# Patient Record
Sex: Female | Born: 1935 | Race: White | Hispanic: No | Marital: Married | Smoking: Former smoker
Health system: Southern US, Community
[De-identification: ages and names within clinical notes are randomized; demographics above are authoritative.]

## PROBLEM LIST (undated history)

## (undated) DIAGNOSIS — C801 Malignant (primary) neoplasm, unspecified: Secondary | ICD-10-CM

## (undated) DIAGNOSIS — J449 Chronic obstructive pulmonary disease, unspecified: Secondary | ICD-10-CM

## (undated) DIAGNOSIS — M1990 Unspecified osteoarthritis, unspecified site: Secondary | ICD-10-CM

## (undated) DIAGNOSIS — N179 Acute kidney failure, unspecified: Secondary | ICD-10-CM

## (undated) DIAGNOSIS — C50919 Malignant neoplasm of unspecified site of unspecified female breast: Secondary | ICD-10-CM

## (undated) DIAGNOSIS — R0602 Shortness of breath: Secondary | ICD-10-CM

## (undated) DIAGNOSIS — I10 Essential (primary) hypertension: Secondary | ICD-10-CM

## (undated) DIAGNOSIS — E119 Type 2 diabetes mellitus without complications: Secondary | ICD-10-CM

## (undated) HISTORY — PX: LAPAROSCOPIC CHOLECYSTECTOMY: SUR755

## (undated) HISTORY — PX: BREAST SURGERY: SHX581

## (undated) HISTORY — PX: LAPAROSCOPIC HYSTERECTOMY: SHX1926

## (undated) HISTORY — PX: APPENDECTOMY: SHX54

---

## 1997-12-19 ENCOUNTER — Encounter: Admission: RE | Admit: 1997-12-19 | Discharge: 1998-03-19 | Payer: Self-pay | Admitting: Radiation Oncology

## 1999-07-10 ENCOUNTER — Encounter: Payer: Self-pay | Admitting: Oncology

## 1999-07-10 ENCOUNTER — Encounter: Admission: RE | Admit: 1999-07-10 | Discharge: 1999-07-10 | Payer: Self-pay | Admitting: Oncology

## 1999-09-16 ENCOUNTER — Ambulatory Visit (HOSPITAL_COMMUNITY): Admission: RE | Admit: 1999-09-16 | Discharge: 1999-09-16 | Payer: Self-pay | Admitting: *Deleted

## 1999-09-16 ENCOUNTER — Encounter: Payer: Self-pay | Admitting: *Deleted

## 2000-03-03 ENCOUNTER — Encounter: Payer: Self-pay | Admitting: Oncology

## 2000-03-03 ENCOUNTER — Encounter: Admission: RE | Admit: 2000-03-03 | Discharge: 2000-03-03 | Payer: Self-pay | Admitting: Oncology

## 2000-06-20 ENCOUNTER — Inpatient Hospital Stay (HOSPITAL_COMMUNITY): Admission: EM | Admit: 2000-06-20 | Discharge: 2000-06-22 | Payer: Self-pay | Admitting: *Deleted

## 2000-06-20 ENCOUNTER — Encounter: Payer: Self-pay | Admitting: Family Medicine

## 2000-06-20 ENCOUNTER — Encounter: Admission: RE | Admit: 2000-06-20 | Discharge: 2000-06-20 | Payer: Self-pay | Admitting: Family Medicine

## 2000-06-20 ENCOUNTER — Encounter (INDEPENDENT_AMBULATORY_CARE_PROVIDER_SITE_OTHER): Payer: Self-pay | Admitting: Specialist

## 2000-12-30 ENCOUNTER — Inpatient Hospital Stay (HOSPITAL_COMMUNITY): Admission: RE | Admit: 2000-12-30 | Discharge: 2001-01-01 | Payer: Self-pay | Admitting: Urology

## 2001-03-30 ENCOUNTER — Encounter: Payer: Self-pay | Admitting: Oncology

## 2001-03-30 ENCOUNTER — Encounter: Admission: RE | Admit: 2001-03-30 | Discharge: 2001-03-30 | Payer: Self-pay | Admitting: Oncology

## 2001-07-08 ENCOUNTER — Encounter: Admission: RE | Admit: 2001-07-08 | Discharge: 2001-07-08 | Payer: Self-pay | Admitting: Family Medicine

## 2001-07-08 ENCOUNTER — Encounter: Payer: Self-pay | Admitting: Family Medicine

## 2001-12-04 ENCOUNTER — Encounter: Payer: Self-pay | Admitting: Ophthalmology

## 2001-12-08 ENCOUNTER — Ambulatory Visit (HOSPITAL_COMMUNITY): Admission: RE | Admit: 2001-12-08 | Discharge: 2001-12-08 | Payer: Self-pay | Admitting: Ophthalmology

## 2002-04-01 ENCOUNTER — Encounter: Payer: Self-pay | Admitting: Oncology

## 2002-04-01 ENCOUNTER — Encounter: Admission: RE | Admit: 2002-04-01 | Discharge: 2002-04-01 | Payer: Self-pay | Admitting: Oncology

## 2002-04-02 ENCOUNTER — Encounter: Admission: RE | Admit: 2002-04-02 | Discharge: 2002-04-02 | Payer: Self-pay | Admitting: Family Medicine

## 2002-04-02 ENCOUNTER — Encounter: Payer: Self-pay | Admitting: Family Medicine

## 2002-06-15 ENCOUNTER — Ambulatory Visit (HOSPITAL_COMMUNITY): Admission: RE | Admit: 2002-06-15 | Discharge: 2002-06-15 | Payer: Self-pay | Admitting: Gastroenterology

## 2002-11-09 ENCOUNTER — Ambulatory Visit (HOSPITAL_COMMUNITY): Admission: RE | Admit: 2002-11-09 | Discharge: 2002-11-09 | Payer: Self-pay | Admitting: Ophthalmology

## 2003-04-04 ENCOUNTER — Encounter: Payer: Self-pay | Admitting: Family Medicine

## 2003-04-04 ENCOUNTER — Encounter: Admission: RE | Admit: 2003-04-04 | Discharge: 2003-04-04 | Payer: Self-pay | Admitting: Family Medicine

## 2003-04-08 ENCOUNTER — Encounter: Payer: Self-pay | Admitting: Oncology

## 2003-04-08 ENCOUNTER — Encounter: Admission: RE | Admit: 2003-04-08 | Discharge: 2003-04-08 | Payer: Self-pay | Admitting: Gynecology

## 2003-09-29 ENCOUNTER — Inpatient Hospital Stay (HOSPITAL_COMMUNITY): Admission: EM | Admit: 2003-09-29 | Discharge: 2003-09-30 | Payer: Self-pay | Admitting: Emergency Medicine

## 2004-04-11 ENCOUNTER — Encounter: Admission: RE | Admit: 2004-04-11 | Discharge: 2004-04-11 | Payer: Self-pay | Admitting: Oncology

## 2004-04-17 ENCOUNTER — Ambulatory Visit (HOSPITAL_COMMUNITY): Admission: RE | Admit: 2004-04-17 | Discharge: 2004-04-17 | Payer: Self-pay | Admitting: Family Medicine

## 2004-05-02 ENCOUNTER — Encounter: Admission: RE | Admit: 2004-05-02 | Discharge: 2004-05-02 | Payer: Self-pay | Admitting: Family Medicine

## 2004-06-18 ENCOUNTER — Encounter: Admission: RE | Admit: 2004-06-18 | Discharge: 2004-06-18 | Payer: Self-pay | Admitting: Orthopedic Surgery

## 2004-06-21 ENCOUNTER — Ambulatory Visit (HOSPITAL_BASED_OUTPATIENT_CLINIC_OR_DEPARTMENT_OTHER): Admission: RE | Admit: 2004-06-21 | Discharge: 2004-06-21 | Payer: Self-pay | Admitting: Orthopedic Surgery

## 2004-06-21 ENCOUNTER — Ambulatory Visit (HOSPITAL_COMMUNITY): Admission: RE | Admit: 2004-06-21 | Discharge: 2004-06-21 | Payer: Self-pay | Admitting: Orthopedic Surgery

## 2004-07-30 ENCOUNTER — Encounter: Admission: RE | Admit: 2004-07-30 | Discharge: 2004-07-30 | Payer: Self-pay | Admitting: Family Medicine

## 2004-11-15 ENCOUNTER — Ambulatory Visit: Payer: Self-pay | Admitting: Internal Medicine

## 2004-11-15 ENCOUNTER — Inpatient Hospital Stay (HOSPITAL_COMMUNITY): Admission: EM | Admit: 2004-11-15 | Discharge: 2004-11-17 | Payer: Self-pay | Admitting: Emergency Medicine

## 2004-11-22 ENCOUNTER — Emergency Department (HOSPITAL_COMMUNITY): Admission: EM | Admit: 2004-11-22 | Discharge: 2004-11-22 | Payer: Self-pay | Admitting: Emergency Medicine

## 2004-12-07 ENCOUNTER — Ambulatory Visit: Payer: Self-pay | Admitting: Cardiology

## 2004-12-07 ENCOUNTER — Ambulatory Visit: Payer: Self-pay

## 2005-01-28 ENCOUNTER — Ambulatory Visit: Payer: Self-pay | Admitting: Oncology

## 2005-03-10 ENCOUNTER — Encounter: Admission: RE | Admit: 2005-03-10 | Discharge: 2005-03-10 | Payer: Self-pay | Admitting: Orthopedic Surgery

## 2005-04-12 ENCOUNTER — Encounter: Admission: RE | Admit: 2005-04-12 | Discharge: 2005-04-12 | Payer: Self-pay | Admitting: Oncology

## 2005-07-08 ENCOUNTER — Ambulatory Visit: Payer: Self-pay | Admitting: Oncology

## 2005-08-22 ENCOUNTER — Inpatient Hospital Stay (HOSPITAL_COMMUNITY): Admission: RE | Admit: 2005-08-22 | Discharge: 2005-08-23 | Payer: Self-pay | Admitting: Neurological Surgery

## 2005-09-19 ENCOUNTER — Inpatient Hospital Stay (HOSPITAL_COMMUNITY): Admission: RE | Admit: 2005-09-19 | Discharge: 2005-09-20 | Payer: Self-pay | Admitting: Neurological Surgery

## 2006-04-14 ENCOUNTER — Encounter: Admission: RE | Admit: 2006-04-14 | Discharge: 2006-04-14 | Payer: Self-pay | Admitting: Oncology

## 2007-04-17 ENCOUNTER — Encounter: Admission: RE | Admit: 2007-04-17 | Discharge: 2007-04-17 | Payer: Self-pay | Admitting: Family Medicine

## 2008-01-26 ENCOUNTER — Encounter: Admission: RE | Admit: 2008-01-26 | Discharge: 2008-01-26 | Payer: Self-pay | Admitting: Oncology

## 2008-05-12 ENCOUNTER — Encounter: Admission: RE | Admit: 2008-05-12 | Discharge: 2008-05-12 | Payer: Self-pay | Admitting: Family Medicine

## 2009-05-15 ENCOUNTER — Encounter: Admission: RE | Admit: 2009-05-15 | Discharge: 2009-05-15 | Payer: Self-pay | Admitting: Family Medicine

## 2009-09-24 ENCOUNTER — Inpatient Hospital Stay (HOSPITAL_COMMUNITY): Admission: EM | Admit: 2009-09-24 | Discharge: 2009-09-26 | Payer: Self-pay | Admitting: Emergency Medicine

## 2010-04-06 ENCOUNTER — Encounter: Admission: RE | Admit: 2010-04-06 | Discharge: 2010-04-06 | Payer: Self-pay | Admitting: Internal Medicine

## 2010-04-16 ENCOUNTER — Ambulatory Visit (HOSPITAL_COMMUNITY): Admission: RE | Admit: 2010-04-16 | Discharge: 2010-04-16 | Payer: Self-pay | Admitting: Internal Medicine

## 2010-05-16 ENCOUNTER — Encounter: Admission: RE | Admit: 2010-05-16 | Discharge: 2010-05-16 | Payer: Self-pay | Admitting: Internal Medicine

## 2010-10-08 ENCOUNTER — Encounter: Payer: Self-pay | Admitting: Oncology

## 2010-12-02 LAB — URINE MICROSCOPIC-ADD ON

## 2010-12-02 LAB — CBC
HCT: 35 % — ABNORMAL LOW (ref 36.0–46.0)
HCT: 39.6 % (ref 36.0–46.0)
Hemoglobin: 13.6 g/dL (ref 12.0–15.0)
MCHC: 34 g/dL (ref 30.0–36.0)
MCHC: 34.2 g/dL (ref 30.0–36.0)
MCHC: 34.5 g/dL (ref 30.0–36.0)
MCV: 91.8 fL (ref 78.0–100.0)
Platelets: 142 10*3/uL — ABNORMAL LOW (ref 150–400)
Platelets: 143 10*3/uL — ABNORMAL LOW (ref 150–400)
Platelets: 158 10*3/uL (ref 150–400)
RBC: 3.81 MIL/uL — ABNORMAL LOW (ref 3.87–5.11)
RBC: 4.32 MIL/uL (ref 3.87–5.11)
RDW: 14.4 % (ref 11.5–15.5)
RDW: 14.6 % (ref 11.5–15.5)
WBC: 11.8 K/uL — ABNORMAL HIGH (ref 4.0–10.5)
WBC: 14.5 10*3/uL — ABNORMAL HIGH (ref 4.0–10.5)
WBC: 9.5 10*3/uL (ref 4.0–10.5)

## 2010-12-02 LAB — DIFFERENTIAL
Basophils Absolute: 0 10*3/uL (ref 0.0–0.1)
Basophils Absolute: 0 10*3/uL (ref 0.0–0.1)
Basophils Relative: 0 % (ref 0–1)
Basophils Relative: 0 % (ref 0–1)
Eosinophils Absolute: 0 10*3/uL (ref 0.0–0.7)
Eosinophils Absolute: 0.3 10*3/uL (ref 0.0–0.7)
Eosinophils Relative: 0 % (ref 0–5)
Lymphocytes Relative: 12 % (ref 12–46)
Lymphocytes Relative: 3 % — ABNORMAL LOW (ref 12–46)
Lymphs Abs: 0.3 K/uL — ABNORMAL LOW (ref 0.7–4.0)
Monocytes Absolute: 0.3 10*3/uL (ref 0.1–1.0)
Monocytes Absolute: 1.6 10*3/uL — ABNORMAL HIGH (ref 0.1–1.0)
Monocytes Relative: 11 % (ref 3–12)
Monocytes Relative: 2 % — ABNORMAL LOW (ref 3–12)
Neutro Abs: 11.1 10*3/uL — ABNORMAL HIGH (ref 1.7–7.7)
Neutrophils Relative %: 75 % (ref 43–77)
Neutrophils Relative %: 95 % — ABNORMAL HIGH (ref 43–77)

## 2010-12-02 LAB — COMPREHENSIVE METABOLIC PANEL
Albumin: 3.2 g/dL — ABNORMAL LOW (ref 3.5–5.2)
BUN: 15 mg/dL (ref 6–23)
BUN: 18 mg/dL (ref 6–23)
CO2: 26 mEq/L (ref 19–32)
Chloride: 100 mEq/L (ref 96–112)
Creatinine, Ser: 0.8 mg/dL (ref 0.4–1.2)
Creatinine, Ser: 0.96 mg/dL (ref 0.4–1.2)
GFR calc Af Amer: >60 mL/min (ref >60)
GFR calc non Af Amer: 56 mL/min — ABNORMAL LOW (ref >60)
GFR calc non Af Amer: >60 mL/min (ref >60)
Glucose, Bld: 111 mg/dL — ABNORMAL HIGH (ref 70–99)
Potassium: 4 mEq/L (ref 3.5–5.1)
Sodium: 134 mEq/L — ABNORMAL LOW (ref 135–145)

## 2010-12-02 LAB — URINALYSIS, ROUTINE W REFLEX MICROSCOPIC
Glucose, UA: NEGATIVE mg/dL
Ketones, ur: NEGATIVE mg/dL
Nitrite: POSITIVE — AB
pH: 7.5 (ref 5.0–8.0)

## 2010-12-02 LAB — COMPREHENSIVE METABOLIC PANEL WITH GFR
ALT: 20 U/L (ref 0–35)
AST: 23 U/L (ref 0–37)
Alkaline Phosphatase: 88 U/L (ref 39–117)
CO2: 27 meq/L (ref 19–32)
Calcium: 8.8 mg/dL (ref 8.4–10.5)
Chloride: 97 meq/L (ref 96–112)
GFR calc Af Amer: >60 mL/min (ref >60)
Glucose, Bld: 129 mg/dL — ABNORMAL HIGH (ref 70–99)
Potassium: 3.9 meq/L (ref 3.5–5.1)
Sodium: 133 meq/L — ABNORMAL LOW (ref 135–145)
Total Bilirubin: 0.5 mg/dL (ref 0.3–1.2)
Total Protein: 6.3 g/dL (ref 6.0–8.3)

## 2010-12-02 LAB — LIPASE, BLOOD: Lipase: 20 U/L (ref 11–59)

## 2010-12-02 LAB — URINE CULTURE: Colony Count: >=100000

## 2010-12-02 LAB — POCT CARDIAC MARKERS
CKMB, poc: <1 ng/mL — ABNORMAL LOW (ref 1.0–8.0)
Myoglobin, poc: 53.9 ng/mL (ref 12–200)

## 2011-02-01 NOTE — Discharge Summary (Signed)
NAME:  [name], [name] NO.:  [id number]   MEDICAL RECORD NO.:  [id number]          PATIENT TYPE:  INP   LOCATION:  3016                         FACILITY:  [facility]   PHYSICIAN:  Stefani Dama, M.D.  DATE OF BIRTH:  December 10, 1923   DATE OF ADMISSION:  09/19/2005  DATE OF DISCHARGE:  09/20/2005                                 DISCHARGE SUMMARY   ADMISSION DIAGNOSIS:  Lumbar spondylosis with stenosis status post placement  of X-stop, L4-L5 with loss of fixation of the X-stop.   DISCHARGE DIAGNOSIS:  Lumbar spondylosis with stenosis status post placement  of X-stop, L4-L5 with loss of fixation of the X-stop.   MAJOR OPERATION:  Revision of placement of X-stop, L4-L5.   CONDITION ON DISCHARGE:  Improving.   HOSPITAL COURSE:  Adrienne [name] in a 77 year old individual who had an X-  stop placed a few weeks ago for relief of symptoms of neurogenic  claudication.  She did not have significant relief almost immediately and  had persistence of back pain.  The first follow-up visit in the office  demonstrated that the X-stop had slid dorsally and was at the interspinous  space.  She is now being readmitted to the hospital to undergo replacement  of the X-stop, possible decompression and fusion if it cannot be replaced  successfully and maintained in position.  She was taken to the operating  room on September 19, 2005, where she underwent revision of the X-stop with  replacement of the device in a slightly modified setting with bony contour  being created between the spinous processes to capture the X-stop better.  With the X-stop being placed in this position, the patient appeared to have  good relief of her neurogenic claudication symptoms.  Her back was sore from  the operation, but she was ambulatory.  She is quite pleased at this point  and was discharged home the following day.  Her incisions remained clean and  dry and her motor strength is intact in the lower  extremities.  She will be  seen in the office in three weeks' time for further follow up.   CONDITION ON DISCHARGE:  Improved,      Stefani Dama, M.D.  Electronically Signed     HJE/MEDQ  D:  11/07/2005  T:  11/08/2005  Job:  [id number]

## 2011-02-01 NOTE — H&P (Signed)
[facility]  Patient:    Adrienne [name], Adrienne [name]                  MRN: [id number] Adm. Date:  06/20/00 Attending:  Sharlet Salina T. Hoxworth, M.D. CC:         Quita Skye. Artis Flock, M.D.   History and Physical  CHIEF COMPLAINT:  Abdominal pain.  HISTORY OF PRESENT ILLNESS:  Adrienne [name] is a 77 year old white female who, 10 days prior to this admission, developed the sudden onset of initially upper back pain radiating to both flanks, followed soon after by epigastric pain and nausea and frequent vomiting.  [place], was evaluated in the emergency room and was treated for constipation.  The pain continued for about two days and then gradually subsided.  Since that time, she has not had any severe pain but has continued to have feeling of tightness, fullness, soreness and occasional mild pain in her epigastrium. Her appetite has been somewhat off but she has had no further nausea or vomiting.  No fever, chills or jaundice; she has, however, felt tired and weak.  She has no previous history of any similar or chronic GI complaints. Bowel movements have been normal without blood or melena.  PAST MEDICAL HISTORY:  Surgical history includes appendectomy and a right lumpectomy, axillary dissection and radiation therapy two and a half years ago for carcinoma of the right breast.  She has a history of brachial cleft cyst removal.  She is not treated for any serious medical illness.  CURRENT MEDICATIONS 1. Tamoxifen 10 mg b.i.d. 2. Quinamm one at night. 3. Aspirin one daily. 4. Multivitamin.  ALLERGIES:  She is allergic to PENICILLIN and TETANUS.  SOCIAL HISTORY:  She is married and accompanied by her husband.  She has a 30-pack-year history of cigarettes but quit 25 years ago.  Does not drink alcohol.  FAMILY HISTORY:  Significant for three brothers who have had surgery for gallstones.  REVIEW OF SYSTEMS:  GENERAL:  Positive for weakness  and malaise during this illness.  HEENT:  Normal.  RESPIRATORY:  Denies shortness of breath, cough, wheezing.  CARDIAC:  Denies chest pain, palpitations, history of cardiac disease.  ABDOMEN:  Positive as above.  GU:  No urinary symptoms or vaginal bleeding.  EXTREMITIES:  She does have occasional lower extremity edema.  No arthritis.  NEUROLOGIC:  No numbness, weakness or syncope.  HEMATOLOGIC: Denies abnormal clotting or bleeding.  PHYSICAL EXAMINATION  VITAL SIGNS:  Temperature is 97, pulse 83, respirations 18, blood pressure 171/82.  GENERAL:  She is a well-developed white female in no acute distress.  SKIN:  Warm and dry without rash or infection.  HEENT:  No palpable adenopathy or thyromegaly.  Sclerae nonicteric.  Nares and oropharynx clear.  LUNGS:  Clear to auscultation.  CARDIAC:  Regular rate and rhythm, without murmurs, rubs, or gallops.  No peripheral edema or JVD.  BREASTS:  Two healed incisions in the right breast.  No palpable masses. Axillae negative.  No palpable masses on the left.  ABDOMEN:  Bowel sounds present.  Well-healed right lower quadrant incision. There is well-localized moderate right upper quadrant tenderness with guarding.  No palpable masses or hepatosplenomegaly.  EXTREMITIES:  No edema, cyanosis, clubbing or deformity.  NEUROLOGIC:  Motor and sensory exams grossly normal.  LABORATORY AND X-RAY FINDINGS:  Chemistries and LFTs all within normal limits. Albumin slightly depressed at 3.3.  Amylase is 59.  Urinalysis normal.  White count  9800 and hematocrit 37.0.  Ultrasound of the gallbladder done today shows a thickened gallbladder wall with multiple gallstones.  Common bile duct is normal at 6 mm.  There are a number of small simple hepatic cysts.  ASSESSMENT AND PLAN:  Ongoing abdominal discomfort, with physical findings and ultrasound evidence of subacute cholecystitis and cholelithiasis.  The patient, I believe, will need laparoscopic  cholecystectomy and she is admitted at this time for the procedure. DD:  06/20/00 TD:  06/20/00 Job: [id number]

## 2011-02-01 NOTE — Discharge Summary (Signed)
[facility]  Patient:    Adrienne [name], Adrienne [name]               MRN: [id number] Adm. Date:  [id number] Disc. Date: [id number] Attending:  Ellwood Handler                           Discharge Summary  DATE OF BIRTH:  March 30, 1924  Indications, medications, allergies, tobacco, ETOH, past medical history, social history, physical exam, and review of systems were all outlined in the admitting note.  HOSPITAL COURSE:  On December 30, 2000, patient was admitted and underwent pubovaginal sling, 3 cm x 15 cm, 18 French Foley catheter left indwelling. There was no suprapubic tube left in place.  By postoperative day #1, patients vaginal packing was removed and she was advanced to a regular diet. On postoperative day #2, patient was ambulating without assistance.  Incision was dry.  She was passing gas and felt comfortable with Foley catheter and a leg bag, and was discharged home with Cipro, Pyridium, and Vicodin, plan made for office visit to remove staples in about seven to 10 days.  Will remove Foley catheter at that time for a voiding trial. DD:  01/14/01 TD:  01/14/01 Job: [id number]

## 2011-02-01 NOTE — Op Note (Signed)
NAME:  Adrienne [name], Adrienne [name] NO.:  [id number]   MEDICAL RECORD NO.:  [id number]          PATIENT TYPE:  AMB   LOCATION:  DSC                          FACILITY:  [facility]   PHYSICIAN:  Katy Fitch. Sypher Jr., M.D.DATE OF BIRTH:  1924-04-22   DATE OF PROCEDURE:  06/21/2004  DATE OF DISCHARGE:                                 OPERATIVE REPORT   PREOPERATIVE DIAGNOSIS:  Degenerative arthritis, left index finger distal  interphalangeal joint with secondary mucous cyst formation and nail  deformity.   POSTOPERATIVE DIAGNOSIS:  Degenerative arthritis, left index finger distal  interphalangeal joint with secondary mucous cyst formation and nail  deformity.   OPERATION PERFORMED:  Distal interphalangeal joint debridement, left index  finger with mucous cyst excision.   SURGEON:  Katy Fitch. Sypher, M.D.   ASSISTANT:  Jonni Sanger, P.A.   ANESTHESIA:  0.25% Marcaine and 2% lidocaine metacarpal head level block of  left index finger supplemented By IV sedation.   SUPERVISING ANESTHESIOLOGIST:  Bedelia Person, M.D.   INDICATIONS FOR PROCEDURE:  Kamrin [name] is a very pleasant 77 year old  woman referred by Quita Skye. Kindl, M.D. for evaluation and management of a  draining cyst involving the dorsal radial aspect of her left index finger  distal interphalangeal joint.  She has a history of chronic osteoarthrosis  with Heberden's nodes.  She has had a cyst on her left index finger for  approximately one year that has ruptured spontaneously several times.   She has soaked her finger and allowed this to heal.  She was referred for  evaluation and management and was noted to have a mucous cyst with  significant osteoarthritis and ulnar deviation of her DIP joint.  We  recommended cyst excision and joint debridement in an effort to resolve the  chronic cyst formation and rupture.  After informed consent she is brought  to the operating room at this time.   DESCRIPTION OF  PROCEDURE:  Bethanie Bloxom was brought to the operating  room and placed in supine position on the operating table.  Following light  sedation, the left arm was prepped with Betadine soap and solution and  sterilely draped.  The left index finger was exsanguinated with a gauze wrap  and a half inch Penrose drain was placed at the base of the finger as a  digital tourniquet.  The procedure commenced with a curvilinear incision  exposing the extensor mechanism.  The capsule between the terminal extensor  tendon slip and the radial collateral  ligament and terminal extensor slip  and the ulnar collateral ligament was resected.  The joint was irrigated  through-and-through and a fine rongeur was used to remove marginal  osteophytes.  The cyst was then circumferentially dissected and removed from  beneath the skin with retrograde dissection. The joint was then irrigated  thoroughly with sterile saline until all debris was removed.  The wound was  repaired with interrupted sutures of 5-0 nylon.  There were no apparent  complications.   For aftercare, Ms. Hinderliter was given prescription for doxycycline 100 mg 1  by mouth daily times five days as prophylactic  antibiotic.       RVS/MEDQ  D:  06/21/2004  T:  06/21/2004  Job:  [id number]   cc:   Quita Skye. Artis Flock, M.D.  [address]  [place]  [place] [address]  Fax: [phone]

## 2011-02-01 NOTE — H&P (Signed)
[place]  Patient:    DICKIE, LABARRE Visit Number: [id number] MRN: [id number]          Service Type: DSU Location: [facility] 2899 15 Attending Physician:  Ivor Messier Dictated by:   Guadelupe Sabin, M.D. Admit Date:  12/08/2001   CC:         Quita Skye. Artis Flock, M.D.   History and Physical  HISTORY:  This was a planned outpatient surgical admission of this 77 year old white female admitted for cataract implant surgery of the right eye.  PRESENT ILLNESS:  This patient has been followed in my office since November 04, 1990.  At that time, she had a sudden onset of pupillary dilation in both eyes with blurring of vision.  The patient was subsequently admitted and evaluated by the [facility].  Subsequently, an abnormal MRI scan of the brain revealed 1.5 cm mass in the posterior fossa representing a probable meningioma.  The patient subsequently had neurosurgery cataract formation noted in both eyes in 1998.  These have progressed reducing and blurring her vision to 20/70 right eye, 20/50 left eye by February 2003.  Due to the patients difficulty with driving, difficulty with glare and difficulty with near vision, she requested cataract surgery.  She was given oral discussion and printed information concerning the procedure and its complications.  She signed informed consent and arrangements were made for outpatient admission at this time.  PAST MEDICAL HISTORY:  The patient continues under the care of Dr. Juleen China and is said to be in good general health.  CURRENT MEDICATIONS: 1. Tamoxifen. 2. Quinine Sulfate. 3. Vitamin B12. 4. Vitamin C. 5. Multivitamins. 6. Calcium. 7. Aspirin. 8. Metamucil.  ALLERGIES:  PENICILLIN, TETANUS.  PHYSICAL EXAMINATION:  VITAL SIGNS:  As recorded on admission.  Blood pressure 147/85, respirations 20, heart rate 74, temperature 97.  GENERAL APPEARANCE:  This is a  pleasant, well-nourished, well-developed white female in no acute distress.  HEENT:  Eyes visual acuity as noted above.  Slit lamp examination the eyes are white and clear with nuclear cataract formation in both eyes.  Applanation tonometry 14 mm in both eyes.  Detailed fundus examination reveals a clear vitreous attached retina with normal optic nerve, blood vessels and macula.  CHEST:  Lungs are clear to percussion and auscultation.  HEART:  Normal sinus rhythm, no cardiomegaly, no murmurs.  ABDOMEN:  Negative.  EXTREMITIES:  Negative.  ADMISSION DIAGNOSIS: Senile cataract right and left eye.  SURGICAL PLAN:  Cataract implant surgery right eye now, left eye later. Dictated by:   Guadelupe Sabin, M.D. Attending Physician:  Ivor Messier DD:  12/08/01 TD:  12/08/01 Job: [id number]

## 2011-02-01 NOTE — Op Note (Signed)
NAME:  Adrienne [name], Adrienne [name] NO.:  [id number]   MEDICAL RECORD NO.:  [id number]          PATIENT TYPE:  INP   LOCATION:  2899                         FACILITY:  [facility]   PHYSICIAN:  Stefani Dama, M.D.  DATE OF BIRTH:  09/13/1924   DATE OF PROCEDURE:  08/22/2005  DATE OF DISCHARGE:                                 OPERATIVE REPORT   PREOPERATIVE DIAGNOSIS:  Lumbar spondylosis with stenosis, L4-L5, lumbar  spondylolisthesis and neurogenic claudication.   POSTOPERATIVE DIAGNOSIS:  Lumbar spondylosis with stenosis, L4-L5, lumbar  spondylolisthesis and neurogenic claudication.   PROCEDURE:  Insertion of X-Stop, L4-L5.   SURGEON:  Stefani Dama, M.D.   ASSISTANT:  None.   ANESTHESIA:  General endotracheal.   INDICATIONS:  Ms. Podolski is a 77 year old individual who has had  significant problems with back and bilateral leg pain with claudication-type  symptoms.  She has a hard time standing for any length of time, in addition  to walking aggravates pain in both her buttocks and legs.  She has  spondylolisthesis of a degenerative nature in the L4-L5 level, and she is  been advised regarding placement of an X-Stop.  She has had conservative  management including some epidural steroid injections, which have not  garnered significant relief.   PROCEDURE:  The patient was brought to the operating room supine on the  stretcher.  After the smooth induction of general endotracheal anesthesia,  she was turned prone and the back was prepped with DuraPrep and draped in a  sterile fashion.  A midline incision was created and carried down to the  lumbar dorsal fascia, which was opened on either side of midline and the L4-  L5 interspace was identified positively with fluoroscopic imaging.  Then  after stripping in a subperiosteal fashion the muscles away from the  interspinous space, a small awl was placed in the interspinous ligament at  the base of the lamina at L4-L5.   This was enlarged with the larger awl and  then an interspinous spreader was placed.  The interspace was spread to the  12 mm size under a considerable amount of tension.  After a few minutes of  ligamentotaxis was allowed to occur, a 10 mm X-Stop was placed into the  interspinous space with some difficulty.  The X-Stop had a tendency to ride  up in the interspinous space.  For this reason, then several #1 Vicryl  interspinous sutures were placed between the spinous processes of L4 andL5.  The spinous process ligament was still intact; however, the sutures were  placed to reinforce this.  With this, final radiographic assessment in the  AP and lateral planes at L4-L5 was obtained.  The wound was copiously  irrigated  antibiotic irrigating solution.  The lumbar dorsal fascia was closed #1  Vicryl in interrupted fashion, 2-0 Vicryl was used on the subcutaneous and  subcuticular tissues, 3-0 Vicryl was used subcuticularly.  Dermabond was  placed on the skin.  The patient tolerated the procedure well.  Blood loss  estimated at less than 50 mL.      Stefani Dama, M.D.  Electronically Signed     HJE/MEDQ  D:  08/22/2005  T:  08/22/2005  Job:  [id number]

## 2011-02-01 NOTE — Cardiovascular Report (Signed)
NAME:  Adrienne [name], Adrienne [name] NO.:  [id number]   MEDICAL RECORD NO.:  [id number]          PATIENT TYPE:  INP   LOCATION:  4703                         FACILITY:  [facility]   PHYSICIAN:  Arvilla Meres, M.D. LHCDATE OF BIRTH:  Aug 15, 1924   DATE OF PROCEDURE:  11/16/2004  DATE OF DISCHARGE:                              CARDIAC CATHETERIZATION   PRIMARY CARE PHYSICIAN:  Rodolph Bong, M.D.   CARDIOLOGIST:  Learta Codding, M.D. [facility]   PATIENT IDENTIFICATION:  Adrienne [name] is a delightful 77 year old woman  with multiple cardiac risk factors who is admitted with chest pain.  She is  referred for diagnostic catheterization to define her coronary anatomy.   PROCEDURE PERFORMED:  1.  Selective coronary angiography.  2.  Left heart catheterization.  3.  Left ventriculogram.   DESCRIPTION OF PROCEDURE:  The risks and benefits of the procedure were  explained to Adrienne [name].  Consent was signed and placed on the chart.  The right groin area was prepped and draped in routine sterile fashion.  The  area was anesthetized with 1% local lidocaine.  A 6 French arterial sheath  was placed in the right femoral artery using the modified Seldinger  technique.  Standard catheters were used throughout the procedure including  a preformed Judkins JL-4, JR-4 and bent pigtail.  All catheter exchanges  were made over wire.  There were no apparent  complications.  At the end of  the procedure, the patient was transferred to the holding area in stable  condition for removal of her arterial sheath.   FINDINGS:   HEMODYNAMICS:  Central aortic pressure was 135/60 with mean of 90.  LV  pressure was 157/6 with EDP of 17.  There is no gradient on pullback across  the aortic valve.   CORONARY ANATOMY:  1.  Left main:  Short with no angiographic coronary artery disease.  2.  LAD:  Long vessel that wrapped the apex and gave off a tiny first      diagonal and a large second diagonal.  In the mid  vessel of the LAD      there was a muscle bridge with about 25% decrease in the diameter during      systole, but no flow-limiting problems.  Otherwise the vessel was free      of angiographic disease.  3.  The left circumflex was a co-dominant vessel that gave off a small OM-1      and large branching OM-2 and small OM-3 and several small posterior      laterals.  There is no angiographic coronary artery disease.  4.  The right coronary artery is a large co-dominant vessel which gave off a      large PDA and two PLs.  There was 25% stenosis in the ostial portion of      the RCA.  Otherwise, the vessel was free of coronary disease.   Left ventriculogram showed EF of approximately 60% with no wall motion  abnormalities or mitral regurgitation.  On quick pan down over her abdominal  aorta that showed some mild aortic plaquing, but no significant  obstructive  disease.   ASSESSMENT/PLAN:  1.  Minimal nonobstructive coronary disease.  2.  Small muscle bridge in the mid LAD without hemodynamic consequence.  3.  Continue with risk factor management.      DB/MEDQ  D:  11/16/2004  T:  11/16/2004  Job:  [id number]   cc:   Rodolph Bong, M.D.  [address] [place], [place] [address]  Fax: [phone]   Learta Codding, M.D. [facility]

## 2011-02-01 NOTE — Discharge Summary (Signed)
NAME:  Adrienne [name], Adrienne [name] NO.:  [id number]   MEDICAL RECORD NO.:  [id number]                   PATIENT TYPE:  INP   LOCATION:  4739                                 FACILITY:  [facility]   PHYSICIAN:  Learta Codding, M.D.                 DATE OF BIRTH:  1924-03-25   DATE OF ADMISSION:  09/29/2003  DATE OF DISCHARGE:  09/30/2003                           DISCHARGE SUMMARY - REFERRING   SUMMARY OF HISTORY:  Adrienne [name] is a 77 year old white female who  presented to the emergency room after being at [facility] with her husband.  She  felt the sudden onset of chest discomfort that began at rest.  She also  noticed a fluttering feeling in her throat and mild shortness of breath but  denied nausea, vomiting, or diaphoresis.  She felt that both arms felt  fatigued/heavy, and in the emergency room still felt a little washed out.  Her blood pressure was noted to be 177/112.   She denies any history of hypertension, diabetes, CVA, thyroid problems.  She does have a mild history of COPD with two hospitalizations for pneumonia  and remote tobacco use.  She has also been treated for breast cancer.   LABORATORY DATA:  On admission, weight was 167.8.  H&H 13.5 and 39.9.  Normal indices.  Platelets 197.  WBCs 7.7.  PTT 46.  Recheck of a PTT was  47.  PT was 13.1 with an INR of 1.0.  Sodium 138, potassium 3.5, BUN 13,  creatinine 0.9, glucose 94.  Normal LFTs.  Albumin was slightly low at 3.3.  CK-MBs were negative x3.  Fasting lipids showed a total cholesterol of 174,  triglycerides 161, HDL 36, LDL 106, and TSH 3.223.   Chest x-ray revealed changes of COPD.  No active disease.   EKGs showed normal sinus rhythm.  Normal axis.  Nonspecific ST/T wave  changes.   HOSPITAL COURSE:  Adrienne [name] was admitted to 33.  Overnight, she did  not have any further chest discomfort, and she felt significantly better.  Her blood pressure remained elevated despite being placed on a beta  blocker;  thus, Dr. Andee Lineman also added HCTZ for her blood pressure.  An Adenosine  Cardiolite was performed on January 15th to evaluate her chest discomfort.  Imaging did not show any signs of ischemia.  Her EF was 56%.  After  discussing with Dr. Andee Lineman, it was felt she could be discharged home.   DISCHARGE DIAGNOSES:  1. Chest discomfort of uncertain etiology with a negative Adenosine     Cardiolite, as previously described.  2. Hypertension.  3. Elevated prothrombin time.  No clinical bleeding.   DISPOSITION:  Adrienne [name] is discharged home.  Her new medications include  fish oil 1000 mg b.i.d., Lopressor 50 mg 1/2 tablet b.i.d., HCTZ 25 mg q.d.  She is asked to continue her aspirin 81 mg q.d., multivitamin with iron  q.d., quinine 325  q.d., glucosamine unknown dosage and frequency, Femara 2.5  mg q.d.  She is on some type of medication weekly for her history of breast  cancer.   She was asked to maintain a low salt, fat, cholesterol diet.  She was asked  to consider beginning a blood pressure diary so the physician could evaluate  her hypertension more thoroughly.  She was asked to call [phone] to arrange  a 3-4 week appointment with Dr. Andee Lineman and to arrange a follow-up  appointment with Dr. Melrose Nakayama for possible  further evaluation of recurring chest discomfort.  At the time of followup  with Dr. Andee Lineman, Dr. Andee Lineman should review her lipid panel and decide if  further treatment is warranted in addition to the fish oil and to continue  pursuit of aggressive cardiac risk factors modifications such as weight loss  and regular exercise.      Joellyn Rued, P.A. [facility]                    Learta Codding, M.D.    EW/MEDQ  D:  09/30/2003  T:  09/30/2003  Job:  [id number]   cc:   Quita Skye. Artis Flock, M.D.  [address], [address]  [place]  [place] [address]  Fax: [phone]   Valentino Hue. Magrinat, M.D.  [address] [facility]  [place]  [place] [address]  Fax: [phone]

## 2011-02-01 NOTE — Discharge Summary (Signed)
NAME:  Adrienne [name], WEIGHT NO.:  [id number]   MEDICAL RECORD NO.:  [id number]          PATIENT TYPE:  INP   LOCATION:  4703                         FACILITY:  [facility]   PHYSICIAN:  Olga Millers, M.D. LHCDATE OF BIRTH:  1924-08-19   DATE OF ADMISSION:  11/15/2004  DATE OF DISCHARGE:  11/17/2004                                 DISCHARGE SUMMARY   PROCEDURES:  1.  Cardiac catheterization.  2.  Coronary arteriogram.  3.  Left ventriculogram.   DISCHARGE DIAGNOSES:  1.  Chest pain, cardiac catheterization negative for critical coronary      artery disease.  2.  Hypertension.  3.  Mild chronic obstructive pulmonary disease.  4.  Remote history of pneumonia.  5.  History of breast cancer, status post lumpectomy.  6.  Status post hysterectomy.  7.  Status post appendectomy.  8.  Status post bladder suspension.  9.  Status post cholecystectomy.   ALLERGIES/INTOLERANCE:  1.  TETANUS.  2.  PENICILLIN.  3.  LATEX.   FAMILY HISTORY:  Coronary artery disease in siblings.   HOSPITAL COURSE:  Adrienne [name] is a 77 year old female with no known  history of coronary artery disease.  She had chest pain on and off for two  weeks and woke up on the day of admission with chest pressure at a 4/10.  She was admitted for further evaluation and treatment.  Her cardiac enzymes  were negative for MI and it was felt that cardiac catheterization was  indicated to define her anatomy.  This was performed on November 16, 2004.  The cardiac catheterization showed an EF of 60% with no wall motion  abnormalities.  There was no MR.  Her coronary arteries were without  significant disease.  There was a 25% stenosis and a muscle bridge in the  LAD and a 25% proximal RCA.  Dr. Gala Romney evaluated the films and felt that  she had minimal nonobstructive coronary artery disease.  He felt that the  small muscle bridge in the mid LAD was without hemodynamic compromise.  He  felt that she needed risk  factor reduction, to follow up with cardiology and  primary care.  If Adrienne [name] completes her bed rest without difficulty,  she is considered stable for discharge on November 16, 2004.   DISCHARGE INSTRUCTIONS:  1.  Her activity level is to include no strenuous activity for two days.  2.  She is to stick to a low fat diet.  3.  She is to call the office for problems with the cath site.  4.  She is to follow up with Dr. Artis Flock as needed.  5.  She is to follow up with Dr. Andee Lineman and a message has been left with the      office.   DISCHARGE MEDICATIONS:  1.  Toprol XL 50 mg every day.  2.  Quinine sulfate 260 mg every day.  3.  Benazepril 5 mg every day.  4.  Lasix 10 mg every day.  5.  Potassium as prior to admission.  6.  Multivitamin every day.  7.  Aspirin 81 mg every  day.  8.  Verapamil 120 mg every day.  9.  Femara 2.5 mg every day.      RB/MEDQ  D:  11/16/2004  T:  11/18/2004  Job:  [id number]   cc:   Quita Skye. Artis Flock, M.D.  [address], [address]  [place]  [place] [address]  Fax: [phone]   Learta Codding, M.D. [facility]

## 2011-02-01 NOTE — Op Note (Signed)
[facility]  Patient:    Adrienne [name], Adrienne [name]               MRN: [id number] Proc. Date: 12/30/00 Adm. Date:  [id number] Attending:  Ellwood Handler CC:         Katherine Roan, M.D.  Quita Skye Artis Flock, M.D.  Valentino Hue. Magrinat, M.D.   Operative Report  DATE OF BIRTH:  04-Jun-1924  REFERRING PHYSICIANS: 1. S. Kyra Manges, M.D., Ob/Gyn. 2. Quita Skye. Artis Flock, M.D., family practice. 3. Valentino Hue. Magrinat, M.D., oncology. 4. Urology, Verl Dicker, M.D.  PREOPERATIVE DIAGNOSIS:  Stress urinary incontinence, refractory to medical therapy, positive Marshall test, somewhat small capacity bladder.  POSTOPERATIVE DIAGNOSIS:  Stress urinary incontinence, refractory to medical therapy, positive Marshall test, somewhat small capacity bladder.  PROCEDURE:  Pubovaginal sling.  SURGEON:  Verl Dicker, M.D.  ANESTHESIA:  General.  DRAINS:  An 18-French Foley.  COMPLICATIONS:  None.  SPECIMENS:  None.  SLING:  3 cm x 15 cm.  DESCRIPTION OF PROCEDURE:  The patient was prepped and draped in the dorsolithotomy position after institution of an adequate level of general anesthesia.  An U-shaped incision was made in the anterior vaginal wall at about mid urethra, carried lateral to the bladder neck.  Index finger used to create a retropubic space lateral to the bladder neck, carried up to the posterior aspect of the rectus sheath.  A transverse suprapubic incision was then carried through the skin and subcutaneous layers down to the anterior surface of the rectus abdominis.  A single Stamey needle was then passed suprapubically along the space that had been created retropubically.  Needles were placed on each side using the index finger as a guide.  Indwelling Foley catheter was removed.  The cystoscope was introduced.  Urothelium carefully inspected with the 30 degree and 70 degree lenses.  Right and left ureteral catheters were  placed and passed easily to 20 cm with no evidence of obstruction.  Needles were lateral to the bladder neck, but there was no evidence of bladder perforation.  A 3 cm x 15 cm cadaveric fascial sling had been created with a running suture of #1 nylon at either end.  Tips of the #1 nylons were then brought through the islets of the Stamey suture, retracted superiorly.  The butt end of DeBakey forceps was then kept between the sling and the urethra to prevent any undue tension on the sling.  The sling was then tied down suprapubically.  Tails of the nylon sutures were then sewed to each other in the midline.  The sling appeared to be in good position.  The suprapubic incision was copiously irrigated with antibiotic solution.  The skin edges with skin staples.  A U-shaped vaginal incision was then closed with interrupted sutures of 3-0 Vicryl.  An 18-French Foley catheter was left indwelling and put to straight drain.  A vaginal Iodoform packing was placed in the vagina, and the patient was returned to recovery in satisfactory condition. DD:  12/30/00 TD:  12/31/00 Job: [id number]

## 2011-02-01 NOTE — Op Note (Signed)
NAME:  Adrienne [name] NO.:  [id number]   MEDICAL RECORD NO.:  [id number]          PATIENT TYPE:  INP   LOCATION:  3016                         FACILITY:  [facility]   PHYSICIAN:  Stefani Dama, M.D.  DATE OF BIRTH:  08-31-1924   DATE OF PROCEDURE:  09/19/2005  DATE OF DISCHARGE:                                 OPERATIVE REPORT   PREOPERATIVE DIAGNOSIS:  Extruded X-Stop L4-L5, lumbar stenosis,  spondylolisthesis.   POSTOPERATIVE DIAGNOSIS:  Extruded X-Stop L4-L5, lumbar stenosis, s,  spondylolisthesis.   PROCEDURE:  Revision of  X-Stop at L4-5.   SURGEON:  Stefani Dama, M.D.   ANESTHESIA:  General endotracheal.   INDICATIONS:  Adrienne [name] is a 77 year old individual who on August 22, 2005, underwent placement of X-Stop at the L4-L5 level. The patient  tolerated procedure well. She was discharged following day and then in two  weeks came back for initial postoperative visit at which point she reported  that she had no relief of the significant leg pain that she is having  preoperatively.  An AP and lateral radiograph of the lumbar spine was  obtained, and this demonstrated that the X-Stop was placed in the  interspinous space.  On initial radiographs obtained in the OR, it was  demonstrated be posterior to the spinous process at L4-L5, thus having  extruded itself.  The patient was informed of the findings and advised that  surgical revision of the placement the X-Stop should be undertaken.  She is  now returned to the operating room for this procedure.   DESCRIPTION OF PROCEDURE:  The patient was brought to the operating room  supine on the stretcher.  After the smooth induction of general endotracheal  anesthesia and placement of Foley catheter, she was then turned prone.  The  back was prepped with DuraPrep and draped in a sterile fashion. The  previously made incision was opened, and the dissection was taken through  the subcutaneous tissues  down to the lumbodorsal fascia.  The fascia itself  was noted be protuberant in this area where it was opened in the midline.  The X-Stop was found dorsal to the interspinous ligament. The interspinous  ligament itself had been breached, and it was felt that the very superior  tip of the L5 spinous process may have been fractured, either that or the  ligamentous cap of the L5 spinous process was exceedingly loose.  The X-Stop  was removed.  The tissues in this area were then removed from the bone in  the interspinous space, and space was cleared.  A small awl was then used at  the base of the spinous processes. This was placed through the spinous  processes.  It was noted that the spinous processes themselves were rather  parallel in orientation and this was also noted on the initial x-ray where  the X-Stop was extruded.  The awl was worked into the interspinous space,  and this space was then gradually dilated out.  It was felt that by using a  small round bur and drilling at the base of the spinous process by  removing  the dorsal most surface of the laminar arches a pocket could be made for the  X-Stop itself to provide a slightly elliptical surface in the interspinous  space to rest the X-Sop in the intraspinous space.  This was created with  the drill, first from the right side and then from the left side.  A large  awl was then used to check the placement of the cavity.  The intraspinous  spreader was also used to open up and relax the interspinous ligament and  the facet joint capsules, and then the X-stop was placed back into the base  of the spinous processes into the elliptical opening.  This allowed for good  distraction of the interspinous space at the very bases of the spinous  process laminar junction.  The end cap was then applied and this was  tightened into position.  Final localizing eight radiographs in the AP and  lateral projection were then obtained to help secure the  space and between  L5 and S1.  A fiber wire suture that was number #2 in size was placed  between the spinous processes and secured with three loops being placed  interspinously.  Once this was accomplished and the final radiographs were  secured, the wound was irrigated copiously with antibiotic irrigating  solution.  The lumbodorsal fascia was then closed with #1 Vicryl and 2-0  Vicryl was used subcutaneously and subcuticularly; 3-0 was used in the  subcuticular tissues and surgical staples were used on the skin.  The  patient tolerated procedure well and was returned to the recovery room in  stable condition. Surgical staples were placed on the skin, and a dry  sterile dressing was applied on top.      Stefani Dama, M.D.  Electronically Signed     HJE/MEDQ  D:  09/19/2005  T:  09/20/2005  Job:  [id number]

## 2011-02-01 NOTE — H&P (Signed)
NAME:  Adrienne [name], KORENEK NO.:  [id number]   MEDICAL RECORD NO.:  [id number]          PATIENT TYPE:  INP   LOCATION:  1824                         FACILITY:  [facility]   PHYSICIAN:  Olga Millers, M.D. LHCDATE OF BIRTH:  [birth year]   DATE OF ADMISSION:  11/15/2004  DATE OF DISCHARGE:                                HISTORY & PHYSICAL   PRIMARY CARDIOLOGIST:  Dr. Andee Lineman.   PRIMARY CARE PHYSICIAN:  Dr. Penni Bombard.  The patient being seen by Dr. Olga Millers today.   CHIEF COMPLAINT:  Chest pain.   HISTORY OF PRESENT ILLNESS:  The patient states she has had this discomfort  on and off for the last two weeks.  Last p.m., she went to bed with the  tightness and pressure in the mid sternal area of her chest.  She states it  was worse to touch.  She woke up this morning.  The pressure was still  there.  She states it is radiating through to her back, rating it around a 4-  5 on a scale of 0-10.  It is positive for shortness of breath.  The patient  complains of increased dyspnea on exertion with steps leading into the  house, negative for lightheadedness, positive for diaphoresis, negative for  nausea and vomiting.  Currently rating the pain around 3-4.  She took  aspirin without relief, finally made an appointment with Dr. Penni Bombard this  morning who did a 12-lead EKG and sent the patient to the [facility]  Emergency Department. She received nitroglycerin x1 sublingual, now rating  the pain around a 2 after nitroglycerin.   ALLERGIES:  She is allergic to Tetanus toxoid, penicillin, latex.   MEDICATIONS:  1.  Toprol XL 50.  2.  Quinine sulfate 260.  3.  Benazepril 5 daily.  4.  Lasix 10 daily.  5.  Potassium.  6.  Multivitamins.  7.  Aspirin 81 daily.  8.  Verapamil 120.  9.  Femora 2.5 mg.   PAST MEDICAL HISTORY:  Significant for cardiac catheterization.  The patient  was seen by Dr. Andee Lineman approximately one year ago for similar symptoms, had  a stress test that  was negative for ischemia with a normal ejection  fraction.  Other medical history includes status post breast cancer with a  right lumpectomy in 1999, hypertension, mild chronic obstructive pulmonary  disease, pneumonia x2 in the past, hysterectomy, appendectomy, bladder  suspension and cholecystectomy.   The patient lives in [place] with her husband.  She is retired.  They have  four adult children, alive and well.  She quit smoking in the 1990's.  Exercise:  She walks.  She denies any ETOH, diet, drug or herbal medication  use.  Her diet is no restrictions.   FAMILY HISTORY:  Mother deceased at age 18.  Father deceased at age 32 from  lung cancer.  She has a brother who is in his 35's with diabetes and he has  already had a heart attack.   REVIEW OF SYSTEMS:  Positive for sweats in last p.m. and this morning.  HEENT:  Positive for nasal discharge.  CARDIOPULMONARY:  Positive for chest  pain, shortness of breath, dyspnea on exertion, occasional wheezing.  MUSCULOSKELETAL:  Positive for arthritic pain.   PHYSICAL EXAMINATION:  VITAL SIGNS:  The patient is afebrile with a pulse of  60, respirations 22, blood pressure 163/87 in the right arm and 168/87 in  the left arm.  GENERAL:  She is alert and oriented, in no acute distress, very pleasant and  cooperative.  Husband at bedside.  HEENT:  Pupils equal, round, reactive to light.  Sclerae is clear.  NECK:  Supple without lymphadenopathy.  Negative bruits, negative JVD.  CARDIOVASCULAR:  Heart rate a regular rate and rhythm.  S1, S2.  LUNGS:  Clear to auscultation.  ABDOMEN:  Soft, positive bowel sounds.  Slightly distended.  EXTREMITIES:  No cyanosis, clubbing or edema.  NEUROLOGICAL:  She is alert and oriented x3.  Cranial nerves II-XII grossly  intact.   Chest x-ray:  No acute findings.  I had Dr. Eppie Gibson look at chest x-ray, rule  out aortic dissection, negative.  EKG:  Rate of 63, sinus rhythm, mild ST  elevation in V2 and T wave  inversion in V1 and aVL, reviewed by Dr.  Jens Som.   Laboratory work pending.  Point of care enzymes:  Troponin less than 0.05.  Dr. Jens Som in to see the patient.   ASSESSMENT AND FINDINGS:  As stated.  A 77 year old female with a past  medical history as above, recently admitted in January of 2005 for similar  symptoms and ruled out Cardiolite negative.  Recurrent chest tightness last  p.m., positive for diaphoresis, positive for shortness of breath and not  pleuritic, not positional, not related to food.  Pain not clearly  exertional.  Recurrent since this a.m.  EKG:  No ST changes, pain with  atypical features and EKG normal.  However, multiple risk factors.  Will  admit and rule out myocardial infarction, check D-dimer.  Proceed with  cardiac catheterization in the morning.  Continue preadmission medications,  add heparin, increase Lotensin to 20 mg a day for better blood pressure  control.      MB/MEDQ  D:  11/15/2004  T:  11/15/2004  Job:  [id number]

## 2011-02-01 NOTE — Op Note (Signed)
   NAME:  Adrienne [name], Adrienne [name] NO.:  [id number]   MEDICAL RECORD NO.:  [id number]                   PATIENT TYPE:  AMB   LOCATION:  ENDO                                 FACILITY:  [facility]   PHYSICIAN:  James L. Malon Kindle., M.D.          DATE OF BIRTH:  1923-10-10   DATE OF PROCEDURE:  06/15/2002  DATE OF DISCHARGE:                                 OPERATIVE REPORT   PROCEDURE PERFORMED:  Colonoscopy.   ENDOSCOPIST:  Llana Aliment. Edwards, M.D.   MEDICATIONS:  Fentanyl 30 mcg, Versed 3 mg IV.   INSTRUMENT USED:  Pediatric Olympus video colonoscope.   INDICATIONS FOR PROCEDURE:  The patient is a woman with breast cancer with a  recent history of rectal bleeding.   DESCRIPTION OF PROCEDURE:  The procedure had been explained to the patient  and consent obtained.  With the patient in the left lateral decubitus  position, the pediatric adjustable Olympus video colonoscope was inserted  and advanced under direct visualization.  The prep was quite good.  We were  able to reach the cecum using abdominal pressure and position changes.  The  ileocecal valve and appendiceal orifice were seen.  The scope was withdrawn  and the cecum, ascending colon, hepatic flexure, transverse colon, splenic  flexure, descending and sigmoid colon were seen well.  No polyps seen.  Marked diverticular disease in the left colon, particularly the sigmoid.  The scope was withdrawn down to the rectum.  The rectum was free of polyps.  Internal hemorrhoids were seen in the rectum.  The scope was withdrawn.  The  patient tolerated the procedure well.    ASSESSMENT:  1. Diverticulosis.  2. Rectal bleeding probably due to internal hemorrhoids.   PLAN:  Will give her a hemorrhoid sheet, suggest fiber supplements and see  back in our office in two months.                                                James L. Malon Kindle., M.D.    Waldron Session  D:  06/15/2002  T:  06/15/2002  Job:  [id number]   cc:   Valentino Hue. Magrinat, M.D.  [address] [facility]  [place]  [place] [address]  Fax: [phone]   Quita Skye. Artis Flock, M.D.

## 2011-02-01 NOTE — Op Note (Signed)
[facility]  Patient:    Adrienne [name], Adrienne [name]                  MRN: [id number] Proc. Date: 06/20/00 Adm. Date:  [id number] Disc. Date: [id number] Attending:  Glenna Fellows T[place]                           Operative Report  PREOPERATIVE DIAGNOSES:  Cholelithiasis and cholecystitis.  POSTOPERATIVE DIAGNOSES:  Cholelithiasis and cholecystitis.  PROCEDURE:  Laparoscopic cholecystectomy.  SURGEON:  Dr. Johna Sheriff.  ASSISTANT:  Dr. Mosetta Anis.  ANESTHESIA:  General.  BRIEF HISTORY:  Adrienne [name] is a 77 year old white female who 10 days ago had the acute onset of back and epigastric pain and nausea and vomiting. This gradually improved over a couple of days but she has remained uncomfortable with pressure discomfort. She saw Dr. Paulita Fujita yesterday and a gallbladder ultrasound was performed today. This shows gallstones and significant thickening of the gallbladder wall. She has marked tenderness with guarding in the right upper quadrant on exam. Laparoscopic cholecystectomy for cholecystitis and cholelithiasis has been recommended and accepted. The nature of the procedure, its indications, risks of bleeding, infection, bile leak and bile duct injury were discussed and understood preoperatively. She is now brought to the operating room for this procedure.  DESCRIPTION OF PROCEDURE:  The patient was brought to the operating room and placed in supine position on the operating table and general endotracheal anesthesia was induced. The abdomen was sterilely prepped and draped. PAS were place. She had received broad spectrum antibiotics preoperatively. Local anesthesia was used to infiltrate the trocar sites. A 1 cm incision was made at the umbilicus and dissection carried down to the midline fascia that was elevated and sharply incised for 1 cm and the peritoneum entered under direct vision. A Hasson trocar was placed  mattress suture of #0 Vicryl  and under direct vision a 10 mm trocar was placed in the subxiphoid area and two 5 mm trocars on the right subcostal margin. The gallbladder was identified and was tense and acutely and subacutely inflamed. The gallbladder was aspirated with a needle aspirator and contained clear bile. The fundus was then able to be grasped. Inflammatory adhesions of the omentum in the duodenum were carefully taken down off the fundus of the gallbladder. The infundibulum was exposed and grasped and retracted inferiorly laterally and further inflammatory attachments were swept toward the porta hepatis. The fibrofatty tissue was then stripped down from the neck of the gallbladder toward the porta hepatis. The distal gallbladder was thoroughly dissected. The cystic duct, gallbladder junction was identified. Calots triangle was dissected. There was a lot of inflammation here but it was mostly edematous and the dissection progressed nicely. The cystic duct was dissected free of about a centimeter and a half and the cystic duct gallbladder junction dissected 360 degrees. The cystic artery was identified coursing up off the gallbladder wall and Calots triangle. When the anatomy was clear, the cystic duct was triply clipped proximally, clipped distally and divided. The anterior and posterior branches of the cystic artery were then divided between clips. The gallbladder was dissected free from its bed using hook and spatula cautery. There was marked inflammatory change and early gangrenous changes of the gallbladder wall. The gallbladder was detached from the liver and removed through and endocatch bag. The right upper quadrant was irrigated and complete hemostasis obtained. A closed suction drain was brought out through a separate  stab wound and left in Morisons pouch. The trocar was removed under direct vision and all CO2 evacuated. The pursestring suture was secured to the umbilicus. Skin incisions were closed  with interrupted subcuticular 4-0 monocryl and Steri-Strips. Sponge, needle and instrument counts were correct. A dry sterile dressing was applied and the patient taken to the recovery room in good condition.  DD:  06/20/00 TD:  06/23/00 Job: [id number]

## 2011-02-01 NOTE — H&P (Signed)
NAME:  Adrienne [name], Adrienne [name] NO.:  [id number]   MEDICAL RECORD NO.:  [id number]                   PATIENT TYPE:  OIB   LOCATION:  NA                                   FACILITY:  [facility]   PHYSICIAN:  Guadelupe Sabin, M.D.             DATE OF BIRTH:  [birth year]   DATE OF ADMISSION:  11/09/2002  DATE OF DISCHARGE:                                HISTORY & PHYSICAL   REASON FOR ADMISSION:  This was a planned outpatient surgical admission of  this 77 year old white female admitted for cataract implant surgery of the  left eye.   PRESENT ILLNESS:  The patient was noted to have bilateral cataract  formation.  She was previously admitted on 12/08/01 for uncomplicated  cataract implant surgery of the right eye.  The patient did well following  this procedure as vision deteriorated in the unoperated left eye to 20/100.  The patient therefore elected due to blurred vision and multiple visual  complaints to proceed with similar cataract implant surgery.  She signed an  informed consent and arrangements made for her outpatient admission at this  time.   PAST MEDICAL HISTORY:  See old chart.  The patient is in stable general  health under the care of her regular physician Dr. Artis Flock.   CURRENT MEDICATIONS:  DermaVite, tamoxifen, quinine sulfate, multiple  vitamins and one aspirin a day which she has stopped 1 week prior to  surgery.   REVIEW OF SYSTEMS:  No cardiorespiratory complaints.   ALLERGIES:  PENICILLIN, TETANUS, and CERTAIN ADHESIVES.   PHYSICAL EXAMINATION:  VITAL SIGNS AS RECORDED ON ADMISSION:  Blood pressure  165/85, respirations 20, heart rate 85, temperature 96.9.  GENERAL APPEARANCE:  The patient is a pleasant well-nourished, well-  developed white female in no acute distress.  HEENT:  Eyes - Visual acuity as noted above.  Slitlamp examination - The  eyes are white and clear with a clear posterior chamber intraocular lens  implant of the right eye  and nuclear cataract of the left eye.  Detailed  fundus examination reveals a clear vitreous, attached retina and normal  optic nerve blood vessels and macula.  CHEST:  Lungs clear to percussion and auscultation.  HEART:  Normal sinus rhythm; no cardiomegaly, no murmurs.  ABDOMEN:  Negative.  EXTREMITIES:  Negative.    ADMISSION DIAGNOSES:  1. Senile nuclear cataract, left eye.  2. Pseudophakia, right eye.   SURGICAL PLAN:  Cataract implant surgery left eye.                                               Guadelupe Sabin, M.D.    HNJ/MEDQ  D:  11/09/2002  T:  11/09/2002  Job:  [id number]   cc:   Quita Skye. Artis Flock, M.D.  [address]  [address]  [place]  [place] [address]  Fax: [phone]

## 2011-02-01 NOTE — H&P (Signed)
NAME:  Adrienne [name], Adrienne [name] NO.:  [id number]   MEDICAL RECORD NO.:  [id number]                   PATIENT TYPE:  INP   LOCATION:  4739                                 FACILITY:  [facility]   PHYSICIAN:  Learta Codding, M.D.                 DATE OF BIRTH:  04/18/1924   DATE OF ADMISSION:  09/29/2003  DATE OF DISCHARGE:                                HISTORY & PHYSICAL   REFERRING PHYSICIANS:  1. Dr. Bradd Canary.  2. Dr. Darnelle Catalan.   CHIEF COMPLAINT:  Sudden onset of sudden chest discomfort earlier this  morning, [facility].   HISTORY OF PRESENT ILLNESS:  Ms. Adrienne [name] is a 77 year old female, former  smoker, with a history of hypertension, who presented to the ER with new  onset of substernal chest pain.  The patient was with her husband at [facility]  this morning where he needed to get some MRI studies done.  While today in  the waiting room, she really felt unwell and developed chest pressure, which  was retrosternal in nature with radiation into the neck area but not into  the jaws.  She also felt both arms becoming extremely heavy.  She felt  slightly short of breath at the same time.  She denies any nausea or  vomiting.  There was no definite diaphoresis.  She had a sensation of  feeling washed out.  This happened around 8:00 this morning.  She came to  the emergency room, and several hours later still had the presence of chest  tightness, which was ultimately relieved with nitroglycerin in the emergency  room.  Her initial EKG during the chest pressure did not reveal any acute  EKG changes, and there was no evidence of ischemia.  Initial blood work  which was done five hours after the onset of the substernal chest pressure,  revealed a negative myoglobin and a negative CK-MB and a negative troponin.  The latter was less than 0.05.  The patient during my exam this evening is  pain-free.  Denies any chest tightness and has no shortness of breath.  She  feels more  comfortable.  She was rather hypertensive in the emergency room  with a blood pressure of 177/112, which has improved, particularly with  diastolic blood pressure, which is now 85 mmHg; however, systolic blood  pressure still remains elevated at 165 mmHg.   ALLERGIES:  PENICILLIN and ____________.   MEDICATIONS:  1. Aspirin 81 mg 1 tablet daily.  2. Quinine 325 mg daily.  3. Glucosamine.   She used to take amoxicillin, which was discontinued.   PAST MEDICAL HISTORY:  1. History of mild COPD.  2. History of hospitalization for pneumonia.  3. History of colonoscopy.  4. History of breast cancer, status post right-sided lumpectomy six years     ago.  Currently in remission.  5. History of bladder suspension.  6. History of hysterectomy and appendectomy.  SOCIAL HISTORY:  The patient lives with her husband.  They live in an  apartment.  She is retired.  She used to smoke but quit in 1981.  She used  to smoke 2-3 packs per day for approximately 35 years.   FAMILY HISTORY:  Mother died at age 64.  Father died at age 74 of lung  cancer.  She has a brother who has diabetes mellitus and a myocardial  infarction in his 56s.   REVIEW OF SYSTEMS:  No fever, chills, or sweats.  No headache or sore  throat.  Positive for chest pain and shortness of breath.  Occasional edema  but none presently.  No frequency or dysuria.  Multiple arthralgias.  No  nausea or vomiting.  No polyuria or polydipsia.   PHYSICAL EXAMINATION:  VITAL SIGNS:  Blood pressure 177/112, pulse 117,  respirations 15, temperature 98.6.  GENERAL:  An elderly white female in no discomfort.  HEENT:  McGregor/AT.  PERRLA.  EOMI.  NECK:  Supple.  No bruits.  No lymphadenopathy.  No JVD.  LUNGS:  Clear breath sounds bilaterally.  HEART:  Regular rate and rhythm with a normal S1 and S2.  There are no  pathological murmurs.  PMI is displaced.  SKIN:  No rash or lesions.  ABDOMEN:  Soft and nontender.  GU/RECTAL:  Deferred.   EXTREMITIES:  No clubbing, cyanosis or edema.  MUSCULOSKELETAL:  Heberden's nodes, but otherwise no abnormalities.  NEUROLOGIC:  Patient is alert and oriented.  Grossly nonfocal.   Chest x-ray is pending.   EKG:  Normal sinus rhythm.  Heart rate 78 beats per minute.  No acute  ischemic changes.  No right infarct pattern.   LABORATORY DATA:  Hemoglobin 13.5, hematocrit 39.9, white count 7.7,  platelet count 197.  BUN 17, creatinine 0.9.  Sodium 138, potassium 3.5,  glucose 94, AST 23, ALT 24, alk phos 74, magnesium 2.2.  The first set of  cardiac enzymes within normal limits.   IMPRESSION:  1. Substernal chest tightness:  The patient does have risk factors for     coronary artery disease.  Her chest pain is somewhat suggestive of     possible angina; however, this occurred in the setting of significant     hypertension.  The patient has no acute ischemic changes with negative     enzymes.  She appears to at low risk.  Feel further workup is indicated.     If her enzymes are positive, she will need a cardiac cath.  If her     enzymes are negative, I would opt for blood pressure control and risk     stratify her Cardiolite studies.  The latter should be performed in the     hospital, and if the study is positive, she will need to proceed with     cardiac catheterization.  2. Hypertension:  Will add hydrochlorothiazide to the patient's medical     regimen.  She was not on any antihypertensive medications.  Will also     give her Lopressor 25 b.i.d. and IV nitroglycerin.  3. History of breast cancer:  In remission.  4. History of elevated prothrombin time:  Her PTT is at 46.  This may well     be due to __________or factor XII.     Patient _____________ bleeding.  Will hold off on heparin at the present     time.  The patient is pain-free and she appears to be low risk.  DISPOSITION:  As outlined above.  Plan an exercise Cardiolite study in the morning if her enzymes are  negative.                                                Learta Codding, M.D.    GED/MEDQ  D:  09/29/2003  T:  09/29/2003  Job:  [id number]   cc:   Quita Skye. Artis Flock, M.D.  [address], [address]  [place]  [place] [address]  Fax: [phone]   Valentino Hue. Magrinat, M.D.  [address] [facility]  [place]  [place] [address]  Fax: [phone]

## 2011-02-01 NOTE — Op Note (Signed)
NAME:  Adrienne [name], Adrienne [name] NO.:  [id number]   MEDICAL RECORD NO.:  [id number]                   PATIENT TYPE:  OIB   LOCATION:  2896                                 FACILITY:  [facility]   PHYSICIAN:  Guadelupe Sabin, M.D.             DATE OF BIRTH:  Jul 27, 1924   DATE OF PROCEDURE:  11/09/2002  DATE OF DISCHARGE:                                 OPERATIVE REPORT   PREOPERATIVE DIAGNOSIS:  Senile nuclear cataract, left eye.   POSTOPERATIVE DIAGNOSIS:  Senile nuclear cataract, left eye.   NAME OF OPERATION:  Planned extracapsular cataract extraction -  phacoemulsification, primary insertion of posterior chamber intraocular lens  implant.   SURGEON:  Guadelupe Sabin, M.D.   ASSISTANT:  Nurse.   ANESTHESIA:  Local 4% Xylocaine, 0.75 Marcaine, topical tetracaine,  intraocular Xylocaine.  Anesthesia standby required.  The patient given  sodium pentothal intravenously during the period of retrobulbar injection.   OPERATIVE PROCEDURE:  After the patient was prepped and draped a lid  speculum was inserted in the left eye.  The eye was turned downward and a  superior rectus traction suture placed.  Schiotz tonometry was recorded at  12 scale units with a 5.5 g weight.  A peritomy was performed adjacent to  the limbus from the 11 to 1 o'clock position.  The corneoscleral junction  was cleaned and a corneoscleral groove made with a 45-degree Superblade.  The anterior chamber was then entered with a 2.5 mm Diamond keratome at the  12 o'clock position and a 15-degree blade at the 2:30 position.  Using a  bent 26-guage needle on a Healon syringe, a circular capsulorrhexis was  begun and then completed with the Grabow forceps.  Hydrodissection and  hydrodelineation were performed using 1% Xylocaine.  A 30-degree  phacoemulsification tip was then inserted with slow controlled  emulsification of the lens nucleus.  Total ultrasonic time 1 minute 3  seconds, average  power level 14%, total amount fluid used 80 mL.  Following  removal of the nucleus, the residual cortex was aspirated with the  irrigation aspiration tip.  The posterior capsule appeared intact with a  brilliant red fundus reflex.  It was therefore elected to insert an Allogen  Medical Optics SI40 and the silicone three-piece posterior chamber  intraocular lens implant with UV absorber Diopter strength +20.00.  This was  inserted with the McDonald forceps into the anterior chamber and then  centered into the capsular bag using the [facility] lens rotator.  The lens  appeared to be well centered.  The Healon which had been used throughout the  procedure was aspirated and replaced with balanced salt solution and Miochol  ophthalmic solution.  The operative incision which was slightly enlarged was  sutured with one 10-0 interrupted nylon suture.  Maxitrol ointment was  instilled in the  conjunctival cul-de-sac and a light patch and protective shield applied.  Duration of procedure and anesthesia  administration 45 minutes.  The patient  tolerated the procedure well in general left the operating room for the  recovery room in good condition.                                               Guadelupe Sabin, M.D.    HNJ/MEDQ  D:  11/09/2002  T:  11/09/2002  Job:  [id number]   cc:   Quita Skye. Artis Flock, M.D.  [address], [address]  [place]  [place] [address]  Fax: [phone]

## 2011-02-01 NOTE — Op Note (Signed)
[name]  Patient:    [name], Adrienne Visit Number: [id number] MRN: [id number]          Service Type: DSU Location: [facility] 2899 15 Attending Physician:  [place] Dictated by:   Guadelupe Sabin, M.D. Proc. Date: 12/08/01 Admit Date:  12/08/2001   CC:         Quita Skye. Artis Flock, M.D.   Operative Report  PREOPERATIVE DIAGNOSIS:  Senile cataract nuclear type right eye.  POSTOPERATIVE DIAGNOSIS:  Senile cataract nuclear type right eye.  NAME OF OPERATION:  Planned extracapsular cataract extraction-phacoemulsification, primary insertion of posterior chamber intraocular lens implant.  SURGEON:  Guadelupe Sabin, M.D.  ASSISTANT:  Nurse.  ANESTHESIA:  Local 4% Xylocaine, 0.75% Marcaine.  Anesthesia standby required in this elderly patient.  The patient was given sodium pentothal intravenously during the period of retrobulbar injection.  PROCEDURE:  A lid speculum was inserted in the right eye.  The eye was turned downward and a superior rectus traction suture placed.  Schiotz tonometry was recorded at 10 scale units with a 5.5 gm weight.  A peritomy was performed adjacent to the limbus from the 11 to 1 oclock position.  The corneoscleral junction was cleaned and a corneoscleral groove was made with a 45 degree Superblade.  The anterior chamber was then entered with the 2.5 mm diamond keratome at the 12 oclock position and the 15 degree blade at the 2:30 position.  Using a bent 26 gauge needle on a Healon syringe, a circular capsulorrhexis was begun and then completed with the Grabow forceps.  Hydrodissection and hydrodelineation were performed using 1% Xylocaine.  A 30 degree phacoemulsification tip was then inserted with slow controlled emulsification of the lens nucleus.  Total ultrasonic time was one minute four seconds. Average power level 19%, total amount of fluid used 100 cc.  Following removal of the nucleus the  residual cortex was aspirated with the irrigation aspiration tip.  The posterior capsule appeared intact with a brilliant red fundus reflex.  It was therefore elected to insert an Allergan medical optics SI40MB silicone posterior chamber three piece intraocular lens implant. Diopter strength +20.00.  This was inserted with the McDonald forceps into the anterior chamber and then centered into the capsular bag using the [facility] lens rotator.  The lens appeared to be well centered.  The Healon which had been used throughout the procedure was aspirated and replaced with balanced salt solution and Miochol ophthalmic solution.  The operative incision appeared to be gaping slightly.  It was elected to place one 10-0 interrupted Nylon suture across the incision to insure closure.  Maxitrol ointment was instilled on the conjunctival cul-de-sac and a light patch and protector shield were applied.  Duration of the procedure and anesthesia administration 45 minutes.  The patient tolerated the procedure well in general.  She left the operating room for the recovery room in good condition. Dictated by:   Guadelupe Sabin, M.D. Attending Physician:  [place] DD:  12/08/01 TD:  12/08/01 Job: [id number]

## 2011-04-09 ENCOUNTER — Other Ambulatory Visit: Payer: Self-pay | Admitting: Internal Medicine

## 2011-04-09 DIAGNOSIS — Z1231 Encounter for screening mammogram for malignant neoplasm of breast: Secondary | ICD-10-CM

## 2011-05-23 ENCOUNTER — Ambulatory Visit
Admission: RE | Admit: 2011-05-23 | Discharge: 2011-05-23 | Disposition: A | Discharge status: Home / Self Care | Payer: Medicare Other | Source: Ambulatory Visit | Attending: Internal Medicine | Admitting: Internal Medicine

## 2011-05-23 DIAGNOSIS — Z1231 Encounter for screening mammogram for malignant neoplasm of breast: Secondary | ICD-10-CM

## 2012-05-06 ENCOUNTER — Other Ambulatory Visit: Payer: Self-pay | Admitting: Internal Medicine

## 2012-05-06 DIAGNOSIS — Z1231 Encounter for screening mammogram for malignant neoplasm of breast: Secondary | ICD-10-CM

## 2012-06-05 ENCOUNTER — Ambulatory Visit
Admission: RE | Admit: 2012-06-05 | Discharge: 2012-06-05 | Disposition: A | Discharge status: Home / Self Care | Payer: Medicare Other | Source: Ambulatory Visit | Attending: Internal Medicine | Admitting: Internal Medicine

## 2012-06-05 DIAGNOSIS — Z1231 Encounter for screening mammogram for malignant neoplasm of breast: Secondary | ICD-10-CM

## 2012-09-28 ENCOUNTER — Emergency Department (HOSPITAL_COMMUNITY): Payer: Medicare Other

## 2012-09-28 ENCOUNTER — Emergency Department (HOSPITAL_COMMUNITY)
Admission: EM | Admit: 2012-09-28 | Discharge: 2012-09-28 | Disposition: A | Discharge status: Home / Self Care | Payer: Medicare Other | Attending: Emergency Medicine | Admitting: Emergency Medicine

## 2012-09-28 DIAGNOSIS — R079 Chest pain, unspecified: Secondary | ICD-10-CM | POA: Insufficient documentation

## 2012-09-28 DIAGNOSIS — R5381 Other malaise: Secondary | ICD-10-CM | POA: Insufficient documentation

## 2012-09-28 DIAGNOSIS — R5383 Other fatigue: Secondary | ICD-10-CM | POA: Insufficient documentation

## 2012-09-28 DIAGNOSIS — Z79899 Other long term (current) drug therapy: Secondary | ICD-10-CM | POA: Insufficient documentation

## 2012-09-28 DIAGNOSIS — R0602 Shortness of breath: Secondary | ICD-10-CM | POA: Insufficient documentation

## 2012-09-28 DIAGNOSIS — Z7982 Long term (current) use of aspirin: Secondary | ICD-10-CM | POA: Insufficient documentation

## 2012-09-28 DIAGNOSIS — M549 Dorsalgia, unspecified: Secondary | ICD-10-CM

## 2012-09-28 LAB — POCT I-STAT TROPONIN I
Troponin i, poc: 0 ng/mL (ref 0.00–0.08)
Troponin i, poc: 0 ng/mL (ref 0.00–0.08)

## 2012-09-28 LAB — COMPREHENSIVE METABOLIC PANEL
ALT: 16 U/L (ref 0–35)
AST: 18 U/L (ref 0–37)
Calcium: 9.5 mg/dL (ref 8.4–10.5)
Potassium: 3.9 mEq/L (ref 3.5–5.1)
Sodium: 137 mEq/L (ref 135–145)
Total Protein: 6.5 g/dL (ref 6.0–8.3)

## 2012-09-28 LAB — CBC WITH DIFFERENTIAL/PLATELET
Basophils Absolute: 0 10*3/uL (ref 0.0–0.1)
Eosinophils Absolute: 0.4 10*3/uL (ref 0.0–0.7)
Eosinophils Relative: 3 % (ref 0–5)
Lymphocytes Relative: 21 % (ref 12–46)
MCH: 29.4 pg (ref 26.0–34.0)
MCV: 92.4 fL (ref 78.0–100.0)
Neutrophils Relative %: 67 % (ref 43–77)
Platelets: 218 10*3/uL (ref 150–400)
RDW: 15.4 % (ref 11.5–15.5)
WBC: 10.4 10*3/uL (ref 4.0–10.5)

## 2012-09-28 LAB — URINALYSIS, ROUTINE W REFLEX MICROSCOPIC
Glucose, UA: NEGATIVE mg/dL
Hgb urine dipstick: NEGATIVE
Specific Gravity, Urine: 1.019 (ref 1.005–1.030)
Urobilinogen, UA: 0.2 mg/dL (ref 0.0–1.0)
pH: 5.5 (ref 5.0–8.0)

## 2012-09-28 NOTE — ED Provider Notes (Signed)
History     CSN: [id number]  Arrival date & time 09/28/12  1521   First MD Initiated Contact with Patient 09/28/12 1533      Chief Complaint  Patient presents with  . Chest Pain    (Consider location/radiation/quality/duration/timing/severity/associated sxs/prior treatment) The history is provided by the patient, the EMS personnel and medical records.    Adrienne [name] is a 79 y.o. female  with a hx of COPD, HTN, breast cancer presents to the Emergency Department complaining of acute, persistent, resolved back pain radiating to the chest onset 12:30 today. Pain is described as intense aching, rated a 8/10, located in her mid-back and radiating under her breast bilaterally into her chest.  Pt states that she was at rest when it began, though she repots to EMS that it began as she was taking the trash out.  She used aspercreme on the site without relief.  Pain resolved spontaneously after 1.5 hours.   Associated symptoms include shortness of breath, fatigue. Nothing makes it better and nothing makes it worse.  Pt denies fever, chills, diaphoresis, headache, neck pain, abdominal pain, nausea, vomiting, diarrhea, dizziness, lightheaded, weakness, syncope.     No past medical history on file.  No past surgical history on file.  No family history on file.  History  Substance Use Topics  . Smoking status: Not on file  . Smokeless tobacco: Not on file  . Alcohol Use: Not on file    OB History    No data available      Review of Systems  Constitutional: Positive for fatigue. Negative for fever, diaphoresis, appetite change and unexpected weight change.  HENT: Negative for mouth sores and neck stiffness.   Eyes: Negative for visual disturbance.  Respiratory: Positive for shortness of breath. Negative for cough, chest tightness and wheezing.   Cardiovascular: Positive for chest pain.  Gastrointestinal: Negative for nausea, vomiting, abdominal pain, diarrhea and constipation.    Genitourinary: Negative for dysuria, urgency, frequency and hematuria.  Skin: Negative for rash.  Neurological: Negative for syncope, light-headedness and headaches.  Psychiatric/Behavioral: Negative for sleep disturbance. The patient is not nervous/anxious.   All other systems reviewed and are negative.    Allergies  Tetanus toxoids and Penicillins  Home Medications   Current Outpatient Rx  Name  Route  Sig  Dispense  Refill  . ASPIRIN EC 81 MG PO TBEC   Oral   Take 81 mg by mouth every evening.         Marland Kitchen BENAZEPRIL HCL 40 MG PO TABS   Oral   Take 40 mg by mouth daily.         Marland Kitchen CALCIUM PO   Oral   Take 1 tablet by mouth 2 (two) times daily.         Marland Kitchen VITAMIN D PO   Oral   Take 1 capsule by mouth every evening.         . COQ10 PO   Oral   Take 10 mLs by mouth daily.         . FUROSEMIDE 40 MG PO TABS   Oral   Take 20 mg by mouth daily.         [place] OP   Ophthalmic   Apply 1 drop to eye 2 (two) times daily as needed. For itchy eyes         . MAGNESIUM PO   Oral   Take 1 tablet by mouth 2 (two) times daily.         Marland Kitchen  METOPROLOL SUCCINATE ER 50 MG PO TB24   Oral   Take 25 mg by mouth daily. Take with or immediately following a meal.         . ADULT MULTIVITAMIN W/MINERALS CH   Oral   Take 1 tablet by mouth every evening.         Marland Kitchen PRESERVISION AREDS PO   Oral   Take 1 tablet by mouth 2 (two) times daily.         Marland Kitchen FISH OIL PO   Oral   Take 1 capsule by mouth every evening.         Marland Kitchen POTASSIUM CHLORIDE CRYS ER 10 MEQ PO TBCR   Oral   Take 10 mEq by mouth daily.         Marland Kitchen VERAPAMIL HCL 120 MG PO TABS   Oral   Take 120 mg by mouth 2 (two) times daily.           BP 215/86  Pulse 75  Temp 98.3 F (36.8 C) (Oral)  Resp 20  Ht 5\' 5"  (1.651 m)  Wt 175 lb (79.379 kg)  BMI 29.12 kg/m2  SpO2 98%  Physical Exam  Nursing note and vitals reviewed. Constitutional: She is oriented to person, place, and time. She  appears well-developed and well-nourished. No distress.  HENT:  Head: Normocephalic and atraumatic.  Mouth/Throat: No oropharyngeal exudate.  Eyes: Conjunctivae normal are normal. No scleral icterus.  Neck: Normal range of motion. Neck supple.  Cardiovascular: Normal rate, regular rhythm, normal heart sounds and intact distal pulses.  Exam reveals no gallop and no friction rub.   No murmur heard. Pulmonary/Chest: Effort normal and breath sounds normal. No respiratory distress. She has no decreased breath sounds. She has no wheezes. She has no rhonchi. She has no rales. She exhibits no tenderness.  Abdominal: Soft. Bowel sounds are normal. She exhibits no distension and no mass. There is no tenderness. There is no rebound and no guarding.  Musculoskeletal: Normal range of motion. She exhibits no edema and no tenderness.  Neurological: She is alert and oriented to person, place, and time. She exhibits normal muscle tone. Coordination normal.       Speech is clear and goal oriented Moves extremities without ataxia  Skin: Skin is warm and dry. Rash noted. She is not diaphoretic. No erythema.       Erythematous, raised patches without vesicles located on right side of the thoracic and lumbar paraspinal - does it not appear urticarial in nature.    Psychiatric: She has a normal mood and affect.    ED Course  Procedures (including critical care time)  Labs Reviewed  COMPREHENSIVE METABOLIC PANEL - Abnormal; Notable for the following:    Glucose, Bld 110 (*)     Albumin 3.1 (*)     GFR calc non Af Amer 73 (*)     GFR calc Af Amer 85 (*)     All other components within normal limits  CBC WITH DIFFERENTIAL  LIPASE, BLOOD  URINALYSIS, ROUTINE W REFLEX MICROSCOPIC  POCT I-STAT TROPONIN I  POCT I-STAT TROPONIN I   Dg Chest 2 View  09/28/2012  *RADIOLOGY REPORT*  Clinical Data: Chest pain, weakness, history COPD, hypertension, diabetes, right breast cancer, former smoker  CHEST - 2 VIEW   Comparison: 06/28/2010  Findings: Enlargement of cardiac silhouette. Calcified minimally tortuous thoracic aorta. Mediastinal contours and pulmonary vascularity otherwise normal. Emphysematous and bronchitic changes compatible with COPD. Minimal atelectasis at lung bases.  No definite infiltrate, pleural effusion or pneumothorax. Bones diffusely demineralized.  IMPRESSION: Changes of COPD with minimal atelectasis at both lung bases. Enlargement of cardiac silhouette.   Original Report Authenticated By: Ulyses Southward, M.D.     ECG:  Date: 09/28/2012  Rate: 72  Rhythm: normal sinus rhythm  QRS Axis: normal  Intervals: normal  ST/T Wave abnormalities: normal  Conduction Disutrbances:none  Narrative Interpretation: nonischemic ECG  Old EKG Reviewed: none available   1. Back pain   2. Chest pain   3. Shortness of breath       MDM  YING BLANKENHORN presents for acute onset chest pain.  Concern for ACS, pulm etiology, or MSK pain.  No evidence of shingles on exam; pt is not tender to palpation and does not report burning or tingling in the area.    Urinalysis without evidence of urinary tract infection, complete metabolic panel CBC lipase all unremarkable troponin and troponin remain negative.  ECG is nonischemic.  TIMI score of 1 based on patient's age. She has no personal cardiac history.  Reevaluation of her rash with decrease in erythema and excoriations. No vesicles present, no pain to palpation or burning sensation.  This does not appear to be shingles however I have asked her to followup with her primary care physician regarding the site.  Patient is to be discharged with recommendation to follow up with PCP in regards to today's hospital visit. Chest pain is not likely of cardiac or pulmonary etiology d/t presentation, VSS, no tracheal deviation, no JVD or new murmur, RRR, breath sounds equal bilaterally, EKG without acute abnormalities, negative troponin, and negative CXR. Pt has been  advised to return to the ED if CP becomes exertional, associated with diaphoresis or nausea, radiates to left jaw/arm, worsens or becomes concerning in any way. Pt appears reliable for follow up and is agreeable to discharge.   Case has been discussed with and seen by Dr. Mancel Bale who agrees with the above plan to discharge.    1. Medications: usual home medications 2. Treatment: rest, drink plenty of fluids, 3. Follow Up: Please followup with your primary doctor for discussion of your diagnoses and further evaluation after today's visit; if in emergency department if CP becomes exertional, associated with diaphoresis or nausea, radiates to left jaw/arm, worsens or becomes concerning in any way      Dierdre Forth, PA-C 09/28/12 1956

## 2012-09-28 NOTE — ED Notes (Addendum)
Per ems- Pt reports today when taking trash out, sudden onset central chest pressure around to back on right side along with itching. Took 324 aspirin PTA. EKG unremarkable. Denies pain at this time. EMS questioning shingles, rash to right neck, back, arm. BP 160/80, HR 75. CBG 109

## 2012-09-28 NOTE — ED Notes (Signed)
Pt up in bed eating dinner

## 2012-09-28 NOTE — ED Notes (Signed)
Pt provided diet ginger ale. PA reporting patient can eat and drink. 2nd troponin has been drawn. Pt is alert and oriented. Family at bedside.

## 2012-09-28 NOTE — ED Notes (Signed)
Patient transported to X-ray 

## 2012-09-28 NOTE — ED Notes (Signed)
Family at bedside. 

## 2012-09-29 NOTE — ED Provider Notes (Signed)
Medical screening examination/treatment/procedure(s) were performed by non-physician practitioner and as supervising physician I was immediately available for consultation/collaboration.   Flint Melter, MD 09/29/12 0110

## 2012-10-31 ENCOUNTER — Other Ambulatory Visit: Payer: Self-pay

## 2013-04-21 ENCOUNTER — Other Ambulatory Visit: Payer: Self-pay

## 2013-05-07 ENCOUNTER — Other Ambulatory Visit: Payer: Self-pay

## 2013-05-07 DIAGNOSIS — Z1231 Encounter for screening mammogram for malignant neoplasm of breast: Secondary | ICD-10-CM

## 2013-06-10 ENCOUNTER — Ambulatory Visit: Payer: Medicare Other

## 2013-06-11 ENCOUNTER — Ambulatory Visit
Admission: RE | Admit: 2013-06-11 | Discharge: 2013-06-11 | Disposition: A | Discharge status: Home / Self Care | Payer: Medicare Other | Source: Ambulatory Visit

## 2013-06-11 DIAGNOSIS — Z1231 Encounter for screening mammogram for malignant neoplasm of breast: Secondary | ICD-10-CM

## 2013-07-22 ENCOUNTER — Other Ambulatory Visit: Payer: Self-pay

## 2013-09-27 ENCOUNTER — Other Ambulatory Visit: Payer: Self-pay

## 2013-09-27 ENCOUNTER — Emergency Department (HOSPITAL_COMMUNITY): Payer: Medicare Other

## 2013-09-27 ENCOUNTER — Encounter (HOSPITAL_COMMUNITY): Payer: Self-pay | Admitting: Emergency Medicine

## 2013-09-27 ENCOUNTER — Emergency Department (HOSPITAL_COMMUNITY)
Admission: EM | Admit: 2013-09-27 | Discharge: 2013-09-27 | Disposition: A | Discharge status: Home / Self Care | Payer: Medicare Other | Attending: Emergency Medicine | Admitting: Emergency Medicine

## 2013-09-27 DIAGNOSIS — I10 Essential (primary) hypertension: Secondary | ICD-10-CM | POA: Insufficient documentation

## 2013-09-27 DIAGNOSIS — J449 Chronic obstructive pulmonary disease, unspecified: Secondary | ICD-10-CM | POA: Insufficient documentation

## 2013-09-27 DIAGNOSIS — M129 Arthropathy, unspecified: Secondary | ICD-10-CM | POA: Insufficient documentation

## 2013-09-27 DIAGNOSIS — J4489 Other specified chronic obstructive pulmonary disease: Secondary | ICD-10-CM | POA: Insufficient documentation

## 2013-09-27 DIAGNOSIS — Z853 Personal history of malignant neoplasm of breast: Secondary | ICD-10-CM | POA: Insufficient documentation

## 2013-09-27 DIAGNOSIS — Z880 Allergy status to penicillin: Secondary | ICD-10-CM | POA: Insufficient documentation

## 2013-09-27 DIAGNOSIS — E119 Type 2 diabetes mellitus without complications: Secondary | ICD-10-CM | POA: Insufficient documentation

## 2013-09-27 DIAGNOSIS — Z91040 Latex allergy status: Secondary | ICD-10-CM | POA: Insufficient documentation

## 2013-09-27 DIAGNOSIS — Z79899 Other long term (current) drug therapy: Secondary | ICD-10-CM | POA: Insufficient documentation

## 2013-09-27 DIAGNOSIS — N289 Disorder of kidney and ureter, unspecified: Secondary | ICD-10-CM | POA: Insufficient documentation

## 2013-09-27 DIAGNOSIS — R110 Nausea: Secondary | ICD-10-CM | POA: Insufficient documentation

## 2013-09-27 DIAGNOSIS — Z87891 Personal history of nicotine dependence: Secondary | ICD-10-CM | POA: Insufficient documentation

## 2013-09-27 DIAGNOSIS — Z7982 Long term (current) use of aspirin: Secondary | ICD-10-CM | POA: Insufficient documentation

## 2013-09-27 DIAGNOSIS — R079 Chest pain, unspecified: Secondary | ICD-10-CM

## 2013-09-27 HISTORY — DX: Malignant (primary) neoplasm, unspecified: C80.1

## 2013-09-27 HISTORY — DX: Type 2 diabetes mellitus without complications: E11.9

## 2013-09-27 HISTORY — DX: Malignant neoplasm of unspecified site of unspecified female breast: C50.919

## 2013-09-27 HISTORY — DX: Chronic obstructive pulmonary disease, unspecified: J44.9

## 2013-09-27 HISTORY — DX: Unspecified osteoarthritis, unspecified site: M19.90

## 2013-09-27 HISTORY — DX: Essential (primary) hypertension: I10

## 2013-09-27 LAB — CBC WITH DIFFERENTIAL/PLATELET
BASOS ABS: 0 10*3/uL (ref 0.0–0.1)
BASOS PCT: 0 % (ref 0–1)
EOS ABS: 0.4 10*3/uL (ref 0.0–0.7)
EOS PCT: 4 % (ref 0–5)
HCT: 37.2 % (ref 36.0–46.0)
Hemoglobin: 12.2 g/dL (ref 12.0–15.0)
Lymphocytes Relative: 19 % (ref 12–46)
Lymphs Abs: 1.8 10*3/uL (ref 0.7–4.0)
MCH: 31 pg (ref 26.0–34.0)
MCHC: 32.8 g/dL (ref 30.0–36.0)
MCV: 94.7 fL (ref 78.0–100.0)
Monocytes Absolute: 0.8 10*3/uL (ref 0.1–1.0)
Monocytes Relative: 9 % (ref 3–12)
Neutro Abs: 6.4 10*3/uL (ref 1.7–7.7)
Neutrophils Relative %: 68 % (ref 43–77)
PLATELETS: 181 10*3/uL (ref 150–400)
RBC: 3.93 MIL/uL (ref 3.87–5.11)
RDW: 15.2 % (ref 11.5–15.5)
WBC: 9.4 10*3/uL (ref 4.0–10.5)

## 2013-09-27 LAB — BASIC METABOLIC PANEL
BUN: 27 mg/dL — ABNORMAL HIGH (ref 6–23)
CALCIUM: 12.2 mg/dL — AB (ref 8.4–10.5)
CHLORIDE: 98 meq/L (ref 96–112)
CO2: 32 mEq/L (ref 19–32)
CREATININE: 1.34 mg/dL — AB (ref 0.50–1.10)
GFR calc Af Amer: 40 mL/min — ABNORMAL LOW (ref >90)
GFR calc non Af Amer: 35 mL/min — ABNORMAL LOW (ref >90)
Glucose, Bld: 106 mg/dL — ABNORMAL HIGH (ref 70–99)
POTASSIUM: 3.8 meq/L (ref 3.7–5.3)
Sodium: 141 mEq/L (ref 137–147)

## 2013-09-27 LAB — TROPONIN I
Troponin I: <0.3 ng/mL (ref <0.30)
Troponin I: <0.3 ng/mL (ref <0.30)

## 2013-09-27 NOTE — ED Provider Notes (Signed)
CSN: [id number]     Arrival date & time 09/27/13  0302 History   First MD Initiated Contact with Patient 09/27/13 0328     Chief Complaint  Patient presents with  . Chest Pain   (Consider location/radiation/quality/duration/timing/severity/associated sxs/prior Treatment) Patient is a 80 y.o. female presenting with chest pain. The history is provided by the patient.  Chest Pain She started having chest pain sometime after getting in bed to go to sleep at 10 PM. She is unsure exactly when the pain started. Pain is across her left anterior chest without radiation. She has difficulty characterizing it. There is mild associated dyspnea and nausea but no diaphoresis. Pain remained stable until 2 AM when she took 4 baby aspirin and pain went away. She's never had pain like this before. She rated pain at 6/10 at its worst and it is completely gone now. She has a history of COPD but no cardiac history.  Past Medical History  Diagnosis Date  . COPD (chronic obstructive pulmonary disease)   . Arthritis   . Cancer   . Breast cancer     right  . Diabetes mellitus without complication   . Hypertension    History reviewed. No pertinent past surgical history. History reviewed. No pertinent family history. History  Substance Use Topics  . Smoking status: Former Smoker    Quit date: 02/20/1980  . Smokeless tobacco: Never Used  . Alcohol Use: No   OB History   Grav Para Term Preterm Abortions TAB SAB Ect Mult Living                 Review of Systems  Cardiovascular: Positive for chest pain.  All other systems reviewed and are negative.    Allergies  Latex; Tetanus toxoids; and Penicillins  Home Medications   Current Outpatient Rx  Name  Route  Sig  Dispense  Refill  . aspirin EC 81 MG tablet   Oral   Take 81 mg by mouth every evening.         . benazepril (LOTENSIN) 40 MG tablet   Oral   Take 40 mg by mouth daily.         Marland Kitchen CALCIUM PO   Oral   Take 1 tablet by mouth 2  (two) times daily.         . Cholecalciferol (VITAMIN D PO)   Oral   Take 1 capsule by mouth every evening.         . Coenzyme Q10 (COQ10 PO)   Oral   Take 10 mLs by mouth daily.         . furosemide (LASIX) 40 MG tablet   Oral   Take 20 mg by mouth daily.         Marland Kitchen Ketotifen Fumarate (ALAWAY OP)   Ophthalmic   Apply 1 drop to eye 2 (two) times daily as needed. For itchy eyes         . MAGNESIUM PO   Oral   Take 1 tablet by mouth 2 (two) times daily.         . metoprolol succinate (TOPROL-XL) 50 MG 24 hr tablet   Oral   Take 25 mg by mouth daily. Take with or immediately following a meal.         . Multiple Vitamin (MULTIVITAMIN WITH MINERALS) TABS   Oral   Take 1 tablet by mouth every evening.         . Multiple Vitamins-Minerals (PRESERVISION AREDS PO)  Oral   Take 1 tablet by mouth 2 (two) times daily.         . Omega-3 Fatty Acids (FISH OIL PO)   Oral   Take 1 capsule by mouth every evening.         . potassium chloride (K-DUR,KLOR-CON) 10 MEQ tablet   Oral   Take 10 mEq by mouth daily.         . verapamil (CALAN) 120 MG tablet   Oral   Take 120 mg by mouth 2 (two) times daily.          BP 209/81  Pulse 85  Temp(Src) 98.5 F (36.9 C)  Resp 16  Ht 5\' 4"  (1.626 m)  SpO2 94% Physical Exam  Nursing note and vitals reviewed.  80 year old female, resting comfortably and in no acute distress. Vital signs are significant for hypertension with blood pressure 29/81. Oxygen saturation is 94%, which is normal. Head is normocephalic and atraumatic. PERRLA, EOMI. Oropharynx is clear. Neck is nontender and supple without adenopathy or JVD. Back is nontender and there is no CVA tenderness. Lungs are clear without rales, wheezes, or rhonchi. Chest is nontender. Heart has regular rate and rhythm without murmur. Abdomen is soft, flat, nontender without masses or hepatosplenomegaly and peristalsis is normoactive. Extremities have no cyanosis  or edema, full range of motion is present. Skin is warm and dry without rash. Neurologic: Mental status is normal, cranial nerves are intact, there are no motor or sensory deficits.  ED Course  Procedures (including critical care time) Labs Review Results for orders placed during the hospital encounter of [id number]  BASIC METABOLIC PANEL      Result Value Range   Sodium 141  137 - 147 mEq/L   Potassium 3.8  3.7 - 5.3 mEq/L   Chloride 98  96 - 112 mEq/L   CO2 32  19 - 32 mEq/L   Glucose, Bld 106 (*) 70 - 99 mg/dL   BUN 27 (*) 6 - 23 mg/dL   Creatinine, Ser 1.34 (*) 0.50 - 1.10 mg/dL   Calcium 12.2 (*) 8.4 - 10.5 mg/dL   GFR calc non Af Amer 35 (*) >90 mL/min   GFR calc Af Amer 40 (*) >90 mL/min  CBC WITH DIFFERENTIAL      Result Value Range   WBC 9.4  4.0 - 10.5 K/uL   RBC 3.93  3.87 - 5.11 MIL/uL   Hemoglobin 12.2  12.0 - 15.0 g/dL   HCT 37.2  36.0 - 46.0 %   MCV 94.7  78.0 - 100.0 fL   MCH 31.0  26.0 - 34.0 pg   MCHC 32.8  30.0 - 36.0 g/dL   RDW 15.2  11.5 - 15.5 %   Platelets 181  150 - 400 K/uL   Neutrophils Relative % 68  43 - 77 %   Neutro Abs 6.4  1.7 - 7.7 K/uL   Lymphocytes Relative 19  12 - 46 %   Lymphs Abs 1.8  0.7 - 4.0 K/uL   Monocytes Relative 9  3 - 12 %   Monocytes Absolute 0.8  0.1 - 1.0 K/uL   Eosinophils Relative 4  0 - 5 %   Eosinophils Absolute 0.4  0.0 - 0.7 K/uL   Basophils Relative 0  0 - 1 %   Basophils Absolute 0.0  0.0 - 0.1 K/uL  TROPONIN I      Result Value Range   Troponin I <0.30  <0.30 ng/mL  Imaging Review Dg Chest Port 1 View  09/27/2013   CLINICAL DATA:  Sudden onset chest pain.  EXAM: PORTABLE CHEST - 1 VIEW  COMPARISON:  09/20/2013.  FINDINGS: Prominent markings at the bases is a chronic finding. No definite consolidation or edema. The right heart border is less well defined, but this is likely related to rightward rotation. No effusion or pneumothorax. Stable, normal heart size.  IMPRESSION: Stable exam.  No evidence of acute  cardiopulmonary disease.   Electronically Signed   By: Jorje Guild M.D.   On: 09/27/2013 03:58    EKG Interpretation    Date/Time:  Monday September 27 2013 05:20:53 EST Ventricular Rate:  88 PR Interval:  195 QRS Duration: 88 QT Interval:  427 QTC Calculation: 517 R Axis:   64 Text Interpretation:  Sinus rhythm Atrial premature complexes in couplets Left ventricular hypertrophy Anterior Q waves, possibly due to LVH No significant change since last tracing Confirmed by [facility]  MD, Darrel Gloss ([id number]) on 09/27/2013 6:25:54 AM            MDM   1. Chest pain   2. Renal insufficiency   3. Hypercalcemia    Chest pain of uncertain cause. ECG shows no change from baseline. Troponin has been ordered and she will likely need at the very least a repeat troponin 3 hours later. Old records are reviewed and she has no ED visits or hospitalizations for cardiac issues.  Initial troponin is negative and followup troponin is pending. There's been a mild elevation of her creatinine over baseline and hypercalcemia is noted. This will need to be addressed as an outpatient. Case is signed out to Dr. Mingo Amber to check on followup troponin level.  Delora Fuel, MD 75/[id number]/[id number]

## 2013-09-27 NOTE — ED Notes (Signed)
Pt returned from restroom, O2 sats at 94% on room air, pt reports she isn't sob but feels much better on O2, placed pt back on O2 but at 0.5 liters/min [place] for comfort.

## 2013-09-27 NOTE — ED Notes (Signed)
phlebotomy at bedside.  

## 2013-09-27 NOTE — ED Notes (Signed)
Pt was at home trying to sleep and started having chest pain, that was not radiating anywhere, Pt c/o cp that was 6/10 then but 0/10 (no pain) now, pain was located in the middle of her chest, Pt was c/o SOB but has history of COPD, 96% on Room Air, Pt hypertensive 200/100, No nitro in route, but rec'd 324 ASA, Pt was nauseated due to motion sickness rec'd 4mg  of zofran with no relief

## 2013-09-27 NOTE — ED Notes (Signed)
Pt ambulated to restroom. 

## 2013-09-27 NOTE — Discharge Instructions (Signed)
Chest Pain (Nonspecific) °It is often hard to give a specific diagnosis for the cause of chest pain. There is always a chance that your pain could be related to something serious, such as a heart attack or a blood clot in the lungs. You need to follow up with your caregiver for further evaluation. °CAUSES  °· Heartburn. °· Pneumonia or bronchitis. °· Anxiety or stress. °· Inflammation around your heart (pericarditis) or lung (pleuritis or pleurisy). °· A blood clot in the lung. °· A collapsed lung (pneumothorax). It can develop suddenly on its own (spontaneous pneumothorax) or from injury (trauma) to the chest. °· Shingles infection (herpes zoster virus). °The chest wall is composed of bones, muscles, and cartilage. Any of these can be the source of the pain. °· The bones can be bruised by injury. °· The muscles or cartilage can be strained by coughing or overwork. °· The cartilage can be affected by inflammation and become sore (costochondritis). °DIAGNOSIS  °Lab tests or other studies, such as X-rays, electrocardiography, stress testing, or cardiac imaging, may be needed to find the cause of your pain.  °TREATMENT  °· Treatment depends on what may be causing your chest pain. Treatment may include: °· Acid blockers for heartburn. °· Anti-inflammatory medicine. °· Pain medicine for inflammatory conditions. °· Antibiotics if an infection is present. °· You may be advised to change lifestyle habits. This includes stopping smoking and avoiding alcohol, caffeine, and chocolate. °· You may be advised to keep your head raised (elevated) when sleeping. This reduces the chance of acid going backward from your stomach into your esophagus. °· Most of the time, nonspecific chest pain will improve within 2 to 3 days with rest and mild pain medicine. °HOME CARE INSTRUCTIONS  °· If antibiotics were prescribed, take your antibiotics as directed. Finish them even if you start to feel better. °· For the next few days, avoid physical  activities that bring on chest pain. Continue physical activities as directed. °· Do not smoke. °· Avoid drinking alcohol. °· Only take over-the-counter or prescription medicine for pain, discomfort, or fever as directed by your caregiver. °· Follow your caregiver's suggestions for further testing if your chest pain does not go away. °· Keep any follow-up appointments you made. If you do not go to an appointment, you could develop lasting (chronic) problems with pain. If there is any problem keeping an appointment, you must call to reschedule. °SEEK MEDICAL CARE IF:  °· You think you are having problems from the medicine you are taking. Read your medicine instructions carefully. °· Your chest pain does not go away, even after treatment. °· You develop a rash with blisters on your chest. °SEEK IMMEDIATE MEDICAL CARE IF:  °· You have increased chest pain or pain that spreads to your arm, neck, jaw, back, or abdomen. °· You develop shortness of breath, an increasing cough, or you are coughing up blood. °· You have severe back or abdominal pain, feel nauseous, or vomit. °· You develop severe weakness, fainting, or chills. °· You have a fever. °THIS IS AN EMERGENCY. Do not wait to see if the pain will go away. Get medical help at once. Call your local emergency services (911 in [place] Do not drive yourself to the hospital. °MAKE SURE YOU:  °· Understand these instructions. °· Will watch your condition. °· Will get help right away if you are not doing well or get worse. °Document Released: 06/12/2005 Document Revised: 11/25/2011 Document Reviewed: 04/07/2008 °[employer]® Patient Information ©2014 [employer]   [employer]. ° °

## 2013-10-01 ENCOUNTER — Emergency Department (HOSPITAL_COMMUNITY): Payer: Medicare Other

## 2013-10-01 ENCOUNTER — Inpatient Hospital Stay (HOSPITAL_COMMUNITY): Payer: Medicare Other

## 2013-10-01 ENCOUNTER — Inpatient Hospital Stay (HOSPITAL_COMMUNITY)
Admission: EM | Admit: 2013-10-01 | Discharge: 2013-10-04 | DRG: 190 | Disposition: A | Discharge status: Home / Self Care | Payer: Medicare Other | Attending: Internal Medicine | Admitting: Internal Medicine

## 2013-10-01 ENCOUNTER — Encounter (HOSPITAL_COMMUNITY): Payer: Self-pay | Admitting: Emergency Medicine

## 2013-10-01 DIAGNOSIS — Z87891 Personal history of nicotine dependence: Secondary | ICD-10-CM

## 2013-10-01 DIAGNOSIS — R0609 Other forms of dyspnea: Secondary | ICD-10-CM

## 2013-10-01 DIAGNOSIS — R0989 Other specified symptoms and signs involving the circulatory and respiratory systems: Secondary | ICD-10-CM

## 2013-10-01 DIAGNOSIS — Z923 Personal history of irradiation: Secondary | ICD-10-CM

## 2013-10-01 DIAGNOSIS — I10 Essential (primary) hypertension: Secondary | ICD-10-CM | POA: Diagnosis present

## 2013-10-01 DIAGNOSIS — E785 Hyperlipidemia, unspecified: Secondary | ICD-10-CM | POA: Diagnosis present

## 2013-10-01 DIAGNOSIS — J9600 Acute respiratory failure, unspecified whether with hypoxia or hypercapnia: Secondary | ICD-10-CM | POA: Diagnosis present

## 2013-10-01 DIAGNOSIS — Z85118 Personal history of other malignant neoplasm of bronchus and lung: Secondary | ICD-10-CM

## 2013-10-01 DIAGNOSIS — R7989 Other specified abnormal findings of blood chemistry: Secondary | ICD-10-CM | POA: Diagnosis present

## 2013-10-01 DIAGNOSIS — N179 Acute kidney failure, unspecified: Secondary | ICD-10-CM | POA: Diagnosis present

## 2013-10-01 DIAGNOSIS — M129 Arthropathy, unspecified: Secondary | ICD-10-CM | POA: Diagnosis present

## 2013-10-01 DIAGNOSIS — T502X5A Adverse effect of carbonic-anhydrase inhibitors, benzothiadiazides and other diuretics, initial encounter: Secondary | ICD-10-CM | POA: Diagnosis present

## 2013-10-01 DIAGNOSIS — Z853 Personal history of malignant neoplasm of breast: Secondary | ICD-10-CM

## 2013-10-01 DIAGNOSIS — Z9981 Dependence on supplemental oxygen: Secondary | ICD-10-CM

## 2013-10-01 DIAGNOSIS — T380X5A Adverse effect of glucocorticoids and synthetic analogues, initial encounter: Secondary | ICD-10-CM | POA: Diagnosis present

## 2013-10-01 DIAGNOSIS — J441 Chronic obstructive pulmonary disease with (acute) exacerbation: Principal | ICD-10-CM | POA: Diagnosis present

## 2013-10-01 DIAGNOSIS — R799 Abnormal finding of blood chemistry, unspecified: Secondary | ICD-10-CM

## 2013-10-01 DIAGNOSIS — E119 Type 2 diabetes mellitus without complications: Secondary | ICD-10-CM | POA: Diagnosis present

## 2013-10-01 DIAGNOSIS — J449 Chronic obstructive pulmonary disease, unspecified: Secondary | ICD-10-CM | POA: Diagnosis present

## 2013-10-01 DIAGNOSIS — T3995XA Adverse effect of unspecified nonopioid analgesic, antipyretic and antirheumatic, initial encounter: Secondary | ICD-10-CM | POA: Diagnosis present

## 2013-10-01 HISTORY — DX: Acute kidney failure, unspecified: N17.9

## 2013-10-01 HISTORY — DX: Shortness of breath: R06.02

## 2013-10-01 LAB — CBC
HEMATOCRIT: 40.6 % (ref 36.0–46.0)
HEMOGLOBIN: 13.2 g/dL (ref 12.0–15.0)
MCH: 31.3 pg (ref 26.0–34.0)
MCHC: 32.5 g/dL (ref 30.0–36.0)
MCV: 96.2 fL (ref 78.0–100.0)
Platelets: 215 10*3/uL (ref 150–400)
RBC: 4.22 MIL/uL (ref 3.87–5.11)
RDW: 15.4 % (ref 11.5–15.5)
WBC: 9 10*3/uL (ref 4.0–10.5)

## 2013-10-01 LAB — BASIC METABOLIC PANEL
BUN: 30 mg/dL — AB (ref 6–23)
CALCIUM: 10.7 mg/dL — AB (ref 8.4–10.5)
CO2: 30 mEq/L (ref 19–32)
Chloride: 96 mEq/L (ref 96–112)
Creatinine, Ser: 1.32 mg/dL — ABNORMAL HIGH (ref 0.50–1.10)
GFR, EST AFRICAN AMERICAN: 41 mL/min — AB (ref >90)
GFR, EST NON AFRICAN AMERICAN: 35 mL/min — AB (ref >90)
Glucose, Bld: 107 mg/dL — ABNORMAL HIGH (ref 70–99)
POTASSIUM: 3.9 meq/L (ref 3.7–5.3)
SODIUM: 139 meq/L (ref 137–147)

## 2013-10-01 LAB — POCT I-STAT 3, ART BLOOD GAS (G3+)
Acid-Base Excess: 9 mmol/L — ABNORMAL HIGH (ref 0.0–2.0)
BICARBONATE: 32.7 meq/L — AB (ref 20.0–24.0)
O2 SAT: 98 %
PO2 ART: 90 mmHg (ref 80.0–100.0)
TCO2: 34 mmol/L (ref 0–100)
pCO2 arterial: 41.2 mmHg (ref 35.0–45.0)
pH, Arterial: 7.508 — ABNORMAL HIGH (ref 7.350–7.450)

## 2013-10-01 LAB — TSH: TSH: 1.364 u[IU]/mL (ref 0.350–4.500)

## 2013-10-01 LAB — CREATININE, SERUM
Creatinine, Ser: 1.38 mg/dL — ABNORMAL HIGH (ref 0.50–1.10)
GFR calc Af Amer: 39 mL/min — ABNORMAL LOW (ref >90)
GFR, EST NON AFRICAN AMERICAN: 33 mL/min — AB (ref >90)

## 2013-10-01 LAB — PRO B NATRIURETIC PEPTIDE: Pro B Natriuretic peptide (BNP): 1521 pg/mL — ABNORMAL HIGH (ref 0–450)

## 2013-10-01 LAB — CBC WITH DIFFERENTIAL/PLATELET
BASOS PCT: 0 % (ref 0–1)
Basophils Absolute: 0 10*3/uL (ref 0.0–0.1)
EOS ABS: 0.4 10*3/uL (ref 0.0–0.7)
Eosinophils Relative: 3 % (ref 0–5)
HCT: 39.8 % (ref 36.0–46.0)
Hemoglobin: 13.3 g/dL (ref 12.0–15.0)
Lymphocytes Relative: 14 % (ref 12–46)
Lymphs Abs: 1.7 10*3/uL (ref 0.7–4.0)
MCH: 31.3 pg (ref 26.0–34.0)
MCHC: 33.4 g/dL (ref 30.0–36.0)
MCV: 93.6 fL (ref 78.0–100.0)
MONOS PCT: 9 % (ref 3–12)
Monocytes Absolute: 1 10*3/uL (ref 0.1–1.0)
Neutro Abs: 8.7 10*3/uL — ABNORMAL HIGH (ref 1.7–7.7)
Neutrophils Relative %: 74 % (ref 43–77)
PLATELETS: 223 10*3/uL (ref 150–400)
RBC: 4.25 MIL/uL (ref 3.87–5.11)
RDW: 15.3 % (ref 11.5–15.5)
WBC: 11.7 10*3/uL — ABNORMAL HIGH (ref 4.0–10.5)

## 2013-10-01 LAB — MAGNESIUM: Magnesium: 2.1 mg/dL (ref 1.5–2.5)

## 2013-10-01 LAB — HEMOGLOBIN A1C
HEMOGLOBIN A1C: 6.1 % — AB (ref <5.7)
Mean Plasma Glucose: 128 mg/dL — ABNORMAL HIGH (ref <117)

## 2013-10-01 LAB — POCT I-STAT TROPONIN I: Troponin i, poc: 0.01 ng/mL (ref 0.00–0.08)

## 2013-10-01 LAB — PHOSPHORUS: PHOSPHORUS: 2.6 mg/dL (ref 2.3–4.6)

## 2013-10-01 MED ORDER — IPRATROPIUM-ALBUTEROL 0.5-2.5 (3) MG/3ML IN SOLN: 3.0000 mL | RESPIRATORY_TRACT | Status: DC | PRN

## 2013-10-01 MED ORDER — METHYLPREDNISOLONE SODIUM SUCC 125 MG IJ SOLR
125.0000 mg | Freq: Once | INTRAMUSCULAR | Status: AC
  Administered 2013-10-01: 125 mg via INTRAVENOUS
  Filled 2013-10-01: qty 2

## 2013-10-01 MED ORDER — IOHEXOL 350 MG/ML SOLN
65.0000 mL | Freq: Once | INTRAVENOUS | Status: AC | PRN
  Administered 2013-10-01: 65 mL via INTRAVENOUS

## 2013-10-01 MED ORDER — SODIUM CHLORIDE 0.9 % IJ SOLN: 3.0000 mL | INTRAMUSCULAR | Status: DC | PRN

## 2013-10-01 MED ORDER — IPRATROPIUM-ALBUTEROL 0.5-2.5 (3) MG/3ML IN SOLN
3.0000 mL | Freq: Three times a day (TID) | RESPIRATORY_TRACT | Status: DC
  Administered 2013-10-02 – 2013-10-04 (×8): 3 mL via RESPIRATORY_TRACT
  Filled 2013-10-01 (×7): qty 3

## 2013-10-01 MED ORDER — ACETAMINOPHEN 650 MG RE SUPP: 650.0000 mg | Freq: Four times a day (QID) | RECTAL | Status: DC | PRN

## 2013-10-01 MED ORDER — ACETAMINOPHEN 325 MG PO TABS: 650.0000 mg | ORAL_TABLET | Freq: Four times a day (QID) | ORAL | Status: DC | PRN

## 2013-10-01 MED ORDER — SODIUM CHLORIDE 0.9 % IV BOLUS (SEPSIS)
500.0000 mL | Freq: Once | INTRAVENOUS | Status: AC
  Administered 2013-10-01: 500 mL via INTRAVENOUS

## 2013-10-01 MED ORDER — FUROSEMIDE 10 MG/ML IJ SOLN
40.0000 mg | INTRAMUSCULAR | Status: AC
  Administered 2013-10-01: 40 mg via INTRAVENOUS
  Filled 2013-10-01: qty 4

## 2013-10-01 MED ORDER — SODIUM CHLORIDE 0.9 % IJ SOLN
3.0000 mL | Freq: Two times a day (BID) | INTRAMUSCULAR | Status: DC
  Administered 2013-10-01: 3 mL via INTRAVENOUS

## 2013-10-01 MED ORDER — ALBUTEROL SULFATE (2.5 MG/3ML) 0.083% IN NEBU
5.0000 mg | INHALATION_SOLUTION | Freq: Once | RESPIRATORY_TRACT | Status: AC
  Administered 2013-10-01: 5 mg via RESPIRATORY_TRACT
  Filled 2013-10-01: qty 6

## 2013-10-01 MED ORDER — METOPROLOL SUCCINATE ER 25 MG PO TB24
25.0000 mg | ORAL_TABLET | Freq: Every day | ORAL | Status: DC
  Administered 2013-10-02 – 2013-10-04 (×3): 25 mg via ORAL
  Filled 2013-10-01 (×4): qty 1

## 2013-10-01 MED ORDER — VERAPAMIL HCL 120 MG PO TABS
120.0000 mg | ORAL_TABLET | Freq: Two times a day (BID) | ORAL | Status: DC
  Administered 2013-10-01 – 2013-10-04 (×6): 120 mg via ORAL
  Filled 2013-10-01 (×7): qty 1

## 2013-10-01 MED ORDER — ASPIRIN EC 81 MG PO TBEC
81.0000 mg | DELAYED_RELEASE_TABLET | Freq: Every evening | ORAL | Status: DC
  Administered 2013-10-01 – 2013-10-03 (×3): 81 mg via ORAL
  Filled 2013-10-01 (×4): qty 1

## 2013-10-01 MED ORDER — ADULT MULTIVITAMIN W/MINERALS CH
1.0000 | ORAL_TABLET | Freq: Every evening | ORAL | Status: DC
  Administered 2013-10-01 – 2013-10-03 (×3): 1 via ORAL
  Filled 2013-10-01 (×4): qty 1

## 2013-10-01 MED ORDER — MORPHINE SULFATE 2 MG/ML IJ SOLN: 2.0000 mg | INTRAMUSCULAR | Status: DC | PRN

## 2013-10-01 MED ORDER — IPRATROPIUM-ALBUTEROL 0.5-2.5 (3) MG/3ML IN SOLN
3.0000 mL | RESPIRATORY_TRACT | Status: DC
  Administered 2013-10-01: 3 mL via RESPIRATORY_TRACT
  Filled 2013-10-01: qty 3

## 2013-10-01 MED ORDER — IPRATROPIUM BROMIDE 0.02 % IN SOLN
0.5000 mg | Freq: Once | RESPIRATORY_TRACT | Status: AC
  Administered 2013-10-01: 0.5 mg via RESPIRATORY_TRACT
  Filled 2013-10-01: qty 2.5

## 2013-10-01 MED ORDER — SODIUM CHLORIDE 0.9 % IJ SOLN
3.0000 mL | Freq: Two times a day (BID) | INTRAMUSCULAR | Status: DC
  Administered 2013-10-01 (×2): 3 mL via INTRAVENOUS

## 2013-10-01 MED ORDER — ENOXAPARIN SODIUM 30 MG/0.3ML ~~LOC~~ SOLN
30.0000 mg | SUBCUTANEOUS | Status: DC
  Administered 2013-10-01 – 2013-10-03 (×3): 30 mg via SUBCUTANEOUS
  Filled 2013-10-01 (×4): qty 0.3

## 2013-10-01 MED ORDER — SODIUM CHLORIDE 0.9 % IV SOLN: 250.0000 mL | INTRAVENOUS | Status: DC | PRN

## 2013-10-01 NOTE — ED Notes (Signed)
Attempted report 

## 2013-10-01 NOTE — Progress Notes (Signed)
Report given to nightshift RN who denies any questions or concerns at this time.  Pt resting comfortably in bed and appears in no acute distress. Ranessa Kosta RN 

## 2013-10-01 NOTE — ED Notes (Signed)
Admitting MD at bedside.

## 2013-10-01 NOTE — ED Notes (Signed)
Received pt via EMS with c/o shortness of breath. Pt reports being short of breath since November, when she was diagnosed with bronchitis . Pt seen here Monday for chest pain, denies chest pain at present. Pt reports history of COPD and was to have home O2 set up for her. Pt has not had O2 at home, reports she came here to get oxygen.

## 2013-10-01 NOTE — ED Provider Notes (Addendum)
Medical screening examination/treatment/procedure(s) were conducted as a shared visit with non-physician practitioner(s) and myself.  I personally evaluated the patient during the encounter.  EKG Interpretation    Date/Time:  Friday October 01 2013 10:56:55 EST Ventricular Rate:  72 PR Interval:  182 QRS Duration: 84 QT Interval:  403 QTC Calculation: 441 R Axis:   47 Text Interpretation:  Sinus rhythm Atrial premature complexes Abnormal R-wave progression, early transition Consider left ventricular hypertrophy No significant change since last tracing Confirmed by Nhu Glasby  DO, Elster Corbello ([id number]) on 10/01/2013 12:57:11 PM            Patient is an 80 year old female with a history of hypertension, COPD, prior history of breast cancer who presents emergency department with 3 months of shortness of breath worse with exertion. She states his been worse over the past several weeks. No chest pain. No fever. No lower extremity swelling or pain. No history of PE or DVT. Patient is hypoxic with just minimal exertion, sitting patient upright in bed head her oxygen saturation dropped to 79% on 2 L nasal cannula. She does not wear oxygen at home. She is slightly diminished breath sounds bilaterally but no wheezing. Will treat for possible mild COPD exacerbation. Given history of breast cancer, will also obtain CT of patient's chest to rule out PE. Discussed with hospitalist for admission. Chest x-ray clear. Labs unremarkable. Troponin negative.  [place], DO 10/01/13 [place], DO 10/01/13 [phone]

## 2013-10-01 NOTE — H&P (Signed)
[facility] History and Physical  Adrienne [name] VOJ:[id number] DOB: Sep 14, 1924 DOA: 10/01/2013  Referring physician:  PCP: Thressa Sheller, MD  Specialists:   Chief Complaint: Shortness of breath  HPI: Adrienne [name] is a 80 y.o. female  With a history of COPD, bronchitis and 2014, breast cancer status post radiation, that presents to the emergency room for shortness of breath. Patient had arrived by EMS as she had gone to a local fire station she was not feeling well. At local fire station, her oxygen saturation was found to be 78%. She was placed on 2 L of nasal cannula, and upon coming to the emergency department was found to have an oxygen saturation 97%. Upon movement in the emergency department with nasal cannula, patient didexperience some desaturation.  Patient has been progressively getting short of breath since October 2014 at which point she had bronchitis. She states she's been seeing her primary care physician who has been working to get her oxygen at home. Patient is unable to move around the house without becoming short of breath. Denies any recent illness fever chills or sick contacts. She does state she is able to lie flat with no problems. She denies any chest pain, dizziness, nausea, abdominal pain at this time.  Review of Systems:  Constitutional: Denies fever, chills, diaphoresis, appetite change and fatigue.  HEENT: Denies photophobia, eye pain, redness, hearing loss, ear pain, congestion, sore throat, rhinorrhea, sneezing, mouth sores, trouble swallowing, neck pain, neck stiffness and tinnitus.   Respiratory: Complains of shortness of breath.   Cardiovascular: Denies chest pain, palpitations and leg swelling.  Gastrointestinal: Denies nausea, vomiting, abdominal pain, diarrhea, constipation, blood in stool and abdominal distention.  Genitourinary: Denies dysuria, urgency, frequency, hematuria, flank pain and difficulty urinating.  Musculoskeletal: Denies  myalgias, back pain, joint swelling, arthralgias and gait problem.  Skin: Denies pallor, rash and wound.  Neurological: Denies dizziness, seizures, syncope, weakness, light-headedness, numbness and headaches.  Hematological: Denies adenopathy. Easy bruising, personal or family bleeding history  Psychiatric/Behavioral: Denies suicidal ideation, mood changes, confusion, nervousness, sleep disturbance and agitation  Past Medical History  Diagnosis Date  . COPD (chronic obstructive pulmonary disease)   . Arthritis   . Cancer   . Breast cancer     right  . Diabetes mellitus without complication   . Hypertension   . AKI (acute kidney injury) 10/01/2013   Past Surgical History  Procedure Laterality Date  . Breast surgery Right lumpectomy  . Laparoscopic hysterectomy    . Appendectomy    . Laparoscopic cholecystectomy     Social History:  reports that she quit smoking about 33 years ago. She has never used smokeless tobacco. She reports that she does not drink alcohol or use illicit drugs. Patient currently lives alone. Although her son lives next door.  Allergies  Allergen Reactions  . Latex   . Tetanus Toxoids Swelling  . Penicillins Rash    No family history on file.   Prior to Admission medications   Medication Sig Start Date End Date Taking? Authorizing Provider  aspirin EC 81 MG tablet Take 81 mg by mouth every evening.    Historical Provider, MD  benazepril (LOTENSIN) 40 MG tablet Take 40 mg by mouth daily.    Historical Provider, MD  CALCIUM PO Take 1 tablet by mouth 2 (two) times daily.    Historical Provider, MD  Cholecalciferol (VITAMIN D PO) Take 1 capsule by mouth every evening.    Historical Provider, MD  Coenzyme Q10 (COQ10 PO)  Take 10 mLs by mouth daily.    Historical Provider, MD  furosemide (LASIX) 40 MG tablet Take 40 mg by mouth daily.     Historical Provider, MD  Ketotifen Fumarate (ALAWAY OP) Apply 1 drop to eye 2 (two) times daily as needed. For itchy eyes     Historical Provider, MD  metoprolol succinate (TOPROL-XL) 50 MG 24 hr tablet Take 25 mg by mouth daily. Take with or immediately following a meal.    Historical Provider, MD  Multiple Vitamin (MULTIVITAMIN WITH MINERALS) TABS Take 1 tablet by mouth every evening.    Historical Provider, MD  Multiple Vitamins-Minerals (PRESERVISION AREDS PO) Take 1 tablet by mouth 2 (two) times daily.    Historical Provider, MD  naproxen sodium (ANAPROX) 220 MG tablet Take 220 mg by mouth daily as needed.    Historical Provider, MD  Omega-3 Fatty Acids (FISH OIL PO) Take 1 capsule by mouth every evening.    Historical Provider, MD  potassium chloride (K-DUR,KLOR-CON) 10 MEQ tablet Take 10 mEq by mouth daily.    Historical Provider, MD  verapamil (CALAN) 120 MG tablet Take 120 mg by mouth 2 (two) times daily.    Historical Provider, MD   Physical Exam: Filed Vitals:   10/01/13 1230  BP: 163/64  Pulse: 78  Temp:   Resp: 19     General: Well developed, well nourished, NAD, appears stated age  HEENT: NCAT, PERRLA, EOMI, Anicteic Sclera, mucous membranes moist. No pharyngeal erythema or exudates  Neck: Supple, no JVD, no masses  Cardiovascular: S1 S2 auscultated, no rubs, murmurs or gallops. Irregular rhythm.  Respiratory: Clear to auscultation bilaterally with equal chest rise without wheezing or rhonchi  Abdomen: Soft, nontender, nondistended, + bowel sounds  Extremities: warm dry without cyanosis clubbing or edema  Neuro: AAOx3, cranial nerves grossly intact. Strength 5/5 in patient's upper and lower extremities bilaterally  Skin: Without rashes exudates or nodules  Psych: Normal affect and demeanor with intact judgement and insight  Labs on Admission:  Basic Metabolic Panel:  Recent Labs Lab 09/27/13 0423 10/01/13 1115  NA 141 139  K 3.8 3.9  CL 98 96  CO2 32 30  GLUCOSE 106* 107*  BUN 27* 30*  CREATININE 1.34* 1.32*  CALCIUM 12.2* 10.7*   Liver Function Tests: No results found  for this basename: AST, ALT, ALKPHOS, BILITOT, PROT, ALBUMIN,  in the last 168 hours No results found for this basename: LIPASE, AMYLASE,  in the last 168 hours No results found for this basename: AMMONIA,  in the last 168 hours CBC:  Recent Labs Lab 09/27/13 0423 10/01/13 1115  WBC 9.4 11.7*  NEUTROABS 6.4 8.7*  HGB 12.2 13.3  HCT 37.2 39.8  MCV 94.7 93.6  PLT 181 223   Cardiac Enzymes:  Recent Labs Lab 09/27/13 0424 09/27/13 0707  TROPONINI <0.30 <0.30    BNP (last 3 results)  Recent Labs  10/01/13 1116  PROBNP 1521.0*   CBG: No results found for this basename: GLUCAP,  in the last 168 hours  Radiological Exams on Admission: Dg Chest 2 View  10/01/2013   CLINICAL DATA:  80 year old female with shortness of Breath increasing. Initial encounter.  EXAM: CHEST  2 VIEW  COMPARISON:  09/27/2013 and earlier.  FINDINGS: Stable large lung volumes. Stable cardiomegaly and mediastinal contours. Visualized tracheal air column is within normal limits. No pneumothorax. No pulmonary edema, pleural effusion or consolidation. Chronic increased interstitial markings are stable. Stable right breast surgical clip. No acute osseous  abnormality identified.  IMPRESSION: No acute cardiopulmonary abnormality.   Electronically Signed   By: [name]   On: 10/01/2013 12:13    EKG: Independently reviewed. Sinus rhythm, PACs, rate 70 to  Assessment/Plan Principal Problem:   DOE (dyspnea on exertion) Active Problems:   Hypertension   AKI (acute kidney injury)   COPD (chronic obstructive pulmonary disease)   Diabetes   Elevated brain natriuretic peptide (BNP) level  Dyspnea on exertion Patient will be admitted to telemetry floor. Her oxygen saturations were noted to be 87% on 2 L nasal cannula with slight movement. Patient did have an elevated BNP of over 1500. She has no history of CHF, although does have significant COPD history. Will obtain 2-D echocardiogram as well as CT of the  chest to rule out any pulmonary pathology. Patient does have a history of lung cancer status post radiation therapy. Will maintain patient on oxygen to maintain her saturations above 92%. Will likely need oxygen therapy on discharge. Chest x-ray was negative for any acute cardiopulmonary abnormalities.  Patient did receive one dose of IV Lasix in the emergency department. Will place patient on DuoNeb treatments. Patient currently has no active wheezing, will forego using steroids at this time.  Elevated BNP Again patient does not have a known history of heart failure. However will place patient on fluid restriction, strict intake and output monitoring, daily weights. 2-D echocardiogram was also been ordered.  Acute kidney injury Possibly secondary to the use of Lasix as well as ACE inhibitors. Will hold these medications. 2 elevated BNP and dyspnea with exertion, we'll not provide any IV fluids at this time. However we'll continue to monitor kidney function. If no improvement, would likely ultrasound kidneys. Will not obtain urine electrolytes as these will be skewed due to Lasix use.  COPD with recent diagnosis of bronchitis in October of 2014 Treatment and plan as stated above.  Hypertension Will continue patient on verapamil as well as metoprolol. Will add on hydralazine if needed.  Diabetes Diet controlled at this time. Will obtain hemoglobin A1c. Will not place patient on insulin sliding scale at this time.  Hyperlipidemia Will hold patient's fish oil pills at this time. Will obtain lipid panel. Patient is currently on no statin therapy.  History of breast cancer Patient did complete approximately 30 rounds of radiation therapy.   DVT prophylaxis: Lovenox  Code Status: Full  Condition: Guarded  Family Communication: Son at bedside. Admission, patients condition and plan of care including tests being ordered have been discussed with the patient and son who indicate understanding and  agree with the plan and Code Status.  Disposition Plan: Admitted.  Time spent: 60 minutes  Crystal Scarberry D.O. [facility] Pager [phone]  If 7PM-7AM, please contact night-coverage [email or web address] Password [email or web address] 10/01/2013, 1:32 PM

## 2013-10-01 NOTE — ED Provider Notes (Signed)
CSN: [id number]     Arrival date & time 10/01/13  1035 History   First MD Initiated Contact with Patient 10/01/13 1035     Chief Complaint  Patient presents with  . Shortness of Breath   (Consider location/radiation/quality/duration/timing/severity/associated sxs/prior Treatment) The history is provided by the patient and medical records.   This is an 80 year old female past history significant for hypertension, diabetes, COPD, hx of breast cancer, presenting to the ED via EMS from local fire station for shortness of breath. On arrival, pts O2 sats was in the 80's improved to 97% with 2L via [place].  Patient states symptoms have been progressively worsening since November when she was diagnosed with bronchitis. Patient was seen in the ED earlier this week for chest pain, she denies any chest pain currently. Denies any recent cough, fevers, sweats, chills, or other flu-like symptoms. Patient is not currently on home oxygen, but states this is the set up for her by her primary care physician however it has not been done at this time.  O2 sats 93% on RA on arrival.  Past Medical History  Diagnosis Date  . COPD (chronic obstructive pulmonary disease)   . Arthritis   . Cancer   . Breast cancer     right  . Diabetes mellitus without complication   . Hypertension    No past surgical history on file. No family history on file. History  Substance Use Topics  . Smoking status: Former Smoker    Quit date: 02/20/1980  . Smokeless tobacco: Never Used  . Alcohol Use: No   OB History   Grav Para Term Preterm Abortions TAB SAB Ect Mult Living                 Review of Systems  Respiratory: Positive for shortness of breath.   All other systems reviewed and are negative.    Allergies  Latex; Tetanus toxoids; and Penicillins  Home Medications   Current Outpatient Rx  Name  Route  Sig  Dispense  Refill  . aspirin EC 81 MG tablet   Oral   Take 81 mg by mouth every evening.         .  benazepril (LOTENSIN) 40 MG tablet   Oral   Take 40 mg by mouth daily.         Marland Kitchen CALCIUM PO   Oral   Take 1 tablet by mouth 2 (two) times daily.         . Cholecalciferol (VITAMIN D PO)   Oral   Take 1 capsule by mouth every evening.         . Coenzyme Q10 (COQ10 PO)   Oral   Take 10 mLs by mouth daily.         . furosemide (LASIX) 40 MG tablet   Oral   Take 40 mg by mouth daily.          Marland Kitchen Ketotifen Fumarate (ALAWAY OP)   Ophthalmic   Apply 1 drop to eye 2 (two) times daily as needed. For itchy eyes         . metoprolol succinate (TOPROL-XL) 50 MG 24 hr tablet   Oral   Take 25 mg by mouth daily. Take with or immediately following a meal.         . Multiple Vitamin (MULTIVITAMIN WITH MINERALS) TABS   Oral   Take 1 tablet by mouth every evening.         . Multiple Vitamins-Minerals (PRESERVISION  AREDS PO)   Oral   Take 1 tablet by mouth 2 (two) times daily.         . naproxen sodium (ANAPROX) 220 MG tablet   Oral   Take 220 mg by mouth daily as needed.         . Omega-3 Fatty Acids (FISH OIL PO)   Oral   Take 1 capsule by mouth every evening.         . potassium chloride (K-DUR,KLOR-CON) 10 MEQ tablet   Oral   Take 10 mEq by mouth daily.         . verapamil (CALAN) 120 MG tablet   Oral   Take 120 mg by mouth 2 (two) times daily.          BP 148/61  Pulse 79  Temp(Src) 98 F (36.7 C) (Oral)  Resp 15  Ht 5\' 4"  (1.626 m)  Wt 175 lb (79.379 kg)  BMI 30.02 kg/m2  SpO2 93%  Physical Exam  Nursing note and vitals reviewed. Constitutional: She is oriented to person, place, and time. She appears well-developed and well-nourished. No distress.  HENT:  Head: Normocephalic and atraumatic.  Mouth/Throat: Oropharynx is clear and moist.  Eyes: Conjunctivae and EOM are normal. Pupils are equal, round, and reactive to light.  Neck: Normal range of motion.  Cardiovascular: Normal rate, regular rhythm and normal heart sounds.    Pulmonary/Chest: Effort normal and breath sounds normal. No accessory muscle usage. Not tachypneic. No respiratory distress. She has no wheezes. She has no rhonchi.  Normal work of breathing without accessory muscle use; Coarse breath sounds bilaterally without audible wheezes or rhonchi  Abdominal: Soft. Bowel sounds are normal.  Musculoskeletal: Normal range of motion.  Neurological: She is alert and oriented to person, place, and time.  Skin: Skin is warm and dry. She is not diaphoretic.  Psychiatric: She has a normal mood and affect.    ED Course  Procedures (including critical care time)  Labs Review Labs Reviewed  CBC WITH DIFFERENTIAL - Abnormal; Notable for the following:    WBC 11.7 (*)    Neutro Abs 8.7 (*)    All other components within normal limits  BASIC METABOLIC PANEL - Abnormal; Notable for the following:    Glucose, Bld 107 (*)    BUN 30 (*)    Creatinine, Ser 1.32 (*)    Calcium 10.7 (*)    GFR calc non Af Amer 35 (*)    GFR calc Af Amer 41 (*)    All other components within normal limits  PRO B NATRIURETIC PEPTIDE - Abnormal; Notable for the following:    Pro B Natriuretic peptide (BNP) 1521.0 (*)    All other components within normal limits  D-DIMER, QUANTITATIVE  POCT I-STAT TROPONIN I   Imaging Review Dg Chest 2 View  10/01/2013   CLINICAL DATA:  80 year old female with shortness of Breath increasing. Initial encounter.  EXAM: CHEST  2 VIEW  COMPARISON:  09/27/2013 and earlier.  FINDINGS: Stable large lung volumes. Stable cardiomegaly and mediastinal contours. Visualized tracheal air column is within normal limits. No pneumothorax. No pulmonary edema, pleural effusion or consolidation. Chronic increased interstitial markings are stable. Stable right breast surgical clip. No acute osseous abnormality identified.  IMPRESSION: No acute cardiopulmonary abnormality.   Electronically Signed   By: [name]   On: 10/01/2013 12:13    EKG Interpretation     Date/Time:  Friday October 01 2013 10:56:55 EST Ventricular Rate:  72 PR Interval:  182 QRS Duration: 84 QT Interval:  403 QTC Calculation: 441 R Axis:   47 Text Interpretation:  Sinus rhythm Atrial premature complexes Abnormal R-wave progression, early transition Consider left ventricular hypertrophy No significant change since last tracing Confirmed by WARD  DO, KRISTEN [id number]) on 10/01/2013 12:57:11 PM            MDM   1. Dyspnea on exertion   2. AKI (acute kidney injury)   3. COPD (chronic obstructive pulmonary disease)   4. Diabetes   5. DOE (dyspnea on exertion)   6. Elevated brain natriuretic peptide (BNP) level    EKG NSR with PAC's, no acute ischemic changes.  Trop negative.  Labs as above-- slight elevation of BNP at 1500 without known hx of CHF.  Slight bump in SrCr when compared to baseline.  CXR clear.  Albuterol/atrovent neb and solu-medrol with little improvement.  Went into pts room to attempt to ambulate with pulse ox, O2 sats dropped to 87% after only moving to the edge of the bed.  Will need admission for hypoxia.  Will obtain CT angio and ABG for further evaluation of source of hypoxia.  Consulted hospitalist, Dr. Ree Kida who will admit to [facility], Telemetry.  Larene Pickett, PA-C 10/01/13 1510

## 2013-10-01 NOTE — Progress Notes (Deleted)
Report given to nightshift RN who denies any questions or concerns at this time.  Pt resting comfortably in bed and appears in no acute distress. Noam Karaffa RN 

## 2013-10-01 NOTE — Progress Notes (Addendum)
UR completed Blondina Coderre K. Marua Qin, RN, BSN, MSHL, CCM  10/01/2013 3:14 PM

## 2013-10-02 DIAGNOSIS — J9600 Acute respiratory failure, unspecified whether with hypoxia or hypercapnia: Secondary | ICD-10-CM | POA: Diagnosis present

## 2013-10-02 DIAGNOSIS — J441 Chronic obstructive pulmonary disease with (acute) exacerbation: Principal | ICD-10-CM

## 2013-10-02 DIAGNOSIS — I10 Essential (primary) hypertension: Secondary | ICD-10-CM

## 2013-10-02 DIAGNOSIS — I517 Cardiomegaly: Secondary | ICD-10-CM

## 2013-10-02 LAB — URINALYSIS, ROUTINE W REFLEX MICROSCOPIC
Bilirubin Urine: NEGATIVE
GLUCOSE, UA: NEGATIVE mg/dL
Hgb urine dipstick: NEGATIVE
KETONES UR: NEGATIVE mg/dL
LEUKOCYTES UA: NEGATIVE
Nitrite: NEGATIVE
PROTEIN: NEGATIVE mg/dL
Specific Gravity, Urine: 1.026 (ref 1.005–1.030)
UROBILINOGEN UA: 0.2 mg/dL (ref 0.0–1.0)
pH: 5 (ref 5.0–8.0)

## 2013-10-02 LAB — BASIC METABOLIC PANEL
BUN: 41 mg/dL — ABNORMAL HIGH (ref 6–23)
CO2: 25 meq/L (ref 19–32)
CREATININE: 1.47 mg/dL — AB (ref 0.50–1.10)
Calcium: 10.1 mg/dL (ref 8.4–10.5)
Chloride: 98 mEq/L (ref 96–112)
GFR calc Af Amer: 36 mL/min — ABNORMAL LOW (ref >90)
GFR calc non Af Amer: 31 mL/min — ABNORMAL LOW (ref >90)
Glucose, Bld: 160 mg/dL — ABNORMAL HIGH (ref 70–99)
Potassium: 4.2 mEq/L (ref 3.7–5.3)
Sodium: 139 mEq/L (ref 137–147)

## 2013-10-02 LAB — CBC
HCT: 39.7 % (ref 36.0–46.0)
Hemoglobin: 13.2 g/dL (ref 12.0–15.0)
MCH: 30.9 pg (ref 26.0–34.0)
MCHC: 33.2 g/dL (ref 30.0–36.0)
MCV: 93 fL (ref 78.0–100.0)
Platelets: 205 10*3/uL (ref 150–400)
RBC: 4.27 MIL/uL (ref 3.87–5.11)
RDW: 15.3 % (ref 11.5–15.5)
WBC: 10.1 10*3/uL (ref 4.0–10.5)

## 2013-10-02 LAB — GLUCOSE, CAPILLARY
GLUCOSE-CAPILLARY: 123 mg/dL — AB (ref 70–99)
GLUCOSE-CAPILLARY: 144 mg/dL — AB (ref 70–99)
Glucose-Capillary: 140 mg/dL — ABNORMAL HIGH (ref 70–99)

## 2013-10-02 MED ORDER — POTASSIUM CHLORIDE CRYS ER 10 MEQ PO TBCR
10.0000 meq | EXTENDED_RELEASE_TABLET | Freq: Every day | ORAL | Status: DC
  Administered 2013-10-02 – 2013-10-04 (×3): 10 meq via ORAL
  Filled 2013-10-02 (×3): qty 1

## 2013-10-02 MED ORDER — SODIUM CHLORIDE 0.9 % IV SOLN: INTRAVENOUS | Status: DC

## 2013-10-02 MED ORDER — LORAZEPAM 0.5 MG PO TABS
0.5000 mg | ORAL_TABLET | Freq: Once | ORAL | Status: AC
  Administered 2013-10-02: 0.5 mg via ORAL
  Filled 2013-10-02: qty 1

## 2013-10-02 MED ORDER — PREDNISONE 20 MG PO TABS
40.0000 mg | ORAL_TABLET | Freq: Every day | ORAL | Status: DC
  Administered 2013-10-03 – 2013-10-04 (×2): 40 mg via ORAL
  Filled 2013-10-02 (×3): qty 2

## 2013-10-02 MED ORDER — DOXYCYCLINE HYCLATE 100 MG PO TABS
100.0000 mg | ORAL_TABLET | Freq: Two times a day (BID) | ORAL | Status: DC
  Administered 2013-10-02 – 2013-10-04 (×5): 100 mg via ORAL
  Filled 2013-10-02 (×6): qty 1

## 2013-10-02 MED ORDER — FUROSEMIDE 40 MG PO TABS
40.0000 mg | ORAL_TABLET | Freq: Every day | ORAL | Status: DC
  Administered 2013-10-03 – 2013-10-04 (×2): 40 mg via ORAL
  Filled 2013-10-02 (×2): qty 1

## 2013-10-02 MED ORDER — SODIUM CHLORIDE 0.9 % IV SOLN
INTRAVENOUS | Status: DC
  Administered 2013-10-02: 11:00:00 via INTRAVENOUS

## 2013-10-02 MED ORDER — FUROSEMIDE 40 MG PO TABS
40.0000 mg | ORAL_TABLET | Freq: Every day | ORAL | Status: DC
  Filled 2013-10-02: qty 1

## 2013-10-02 MED ORDER — BENAZEPRIL HCL 40 MG PO TABS
40.0000 mg | ORAL_TABLET | Freq: Every day | ORAL | Status: DC
  Administered 2013-10-02 – 2013-10-04 (×3): 40 mg via ORAL
  Filled 2013-10-02 (×3): qty 1

## 2013-10-02 NOTE — Progress Notes (Signed)
SATURATION QUALIFICATIONS: (This note is used to comply with regulatory documentation for home oxygen)  Patient Saturations on Room Air at Rest = 96%

## 2013-10-02 NOTE — Progress Notes (Signed)
[facility] PROGRESS NOTE Interim History: 80 y.o. female  With a history of COPD, bronchitis and 2014, breast cancer status post radiation, that presents to the emergency room for shortness of breath. Patient had arrived by EMS as she had gone to a local fire station she was not feeling well. At local fire station, her oxygen saturation was found to be 78%. She was placed on 2 L of nasal cannula, and upon coming to the emergency department was found to have an oxygen saturation 97%. Upon movement in the emergency department with nasal cannula, patient didexperience some desaturation  Filed Weights   10/01/13 1049 10/01/13 1439 10/02/13 0500  Weight: 79.379 kg (175 lb) 78.6 kg (173 lb 4.5 oz) 79.7 kg (175 lb 11.3 oz)        Intake/Output Summary (Last 24 hours) at 10/02/13 8119 Last data filed at 10/02/13 0506  Gross per 24 hour  Intake    483 ml  Output    600 ml  Net   -117 ml     Assessment/Plan: Respiratory failure, acute/Acute exacerbation of chronic obstructive pulmonary disease (COPD) - No JVD on Physical exam, moderate air movement no wheezing, start oral steroids. No orthopnea. - Start doxycycline. Cont Inhalers. - CT showed mild edema, but renal function up with lasix, Chloride <100 and mouth is dry. - unlikely HF, most likely COPD exacerbation. - gentle IV hydration check B-met in am.  Hypertension: - BP well controlled. - resume home meds.  AKI (acute kidney injury) - Unclear etiology on lasix, ACE-I and NSAID's. - D/c NSAID's, start IV fluids. b-met in am.   Hyperglycemia: - unclear if DM, HBgA1c, 6.1. - BG high due to steroids.  Code Status: full Family Communication: none  Disposition Plan: inpatient   Consultants:  none  Procedures: ECHO: pending  Antibiotics:  Doxycycline 1.17.2014  HPI/Subjective: Relates SOB is improved. Still feels SOB when walking to bathroom.  Objective: Filed Vitals:   10/02/13 0135 10/02/13 0458 10/02/13  0500 10/02/13 0736  BP: 143/53 149/71    Pulse: 63 83  81  Temp: 98.3 F (36.8 C) 98.1 F (36.7 C)    TempSrc: Oral Oral    Resp: _0 Height:      Weight:   79.7 kg (175 lb 11.3 oz)   SpO2: 93% 93%  97%     Exam:  General: Alert, awake, oriented x3, in no acute distress.  HEENT: No bruits, no goiter. NO JVD Heart: Regular rate and rhythm, without murmurs, rubs, gallops.  Lungs: moderate air movement, bilateral air movement.  Abdomen: Soft, nontender, nondistended, positive bowel sounds.    Data Reviewed: Basic Metabolic Panel:  Recent Labs Lab 09/27/13 0423 10/01/13 1115 10/01/13 1614 10/02/13 0440  NA 141 139  --  139  K 3.8 3.9  --  4.2  CL 98 96  --  98  CO2 32 30  --  25  GLUCOSE 106* 107*  --  160*  BUN 27* 30*  --  41*  CREATININE 1.34* 1.32* 1.38* 1.47*  CALCIUM 12.2* 10.7*  --  10.1  MG  --   --  2.1  --   PHOS  --   --  2.6  --    Liver Function Tests: No results found for this basename: AST, ALT, ALKPHOS, BILITOT, PROT, ALBUMIN,  in the last 168 hours No results found for this basename: LIPASE, AMYLASE,  in the last 168 hours No results found for this basename:  AMMONIA,  in the last 168 hours CBC:  Recent Labs Lab 09/27/13 0423 10/01/13 1115 10/01/13 1614 10/02/13 0440  WBC 9.4 11.7* 9.0 10.1  NEUTROABS 6.4 8.7*  --   --   HGB 12.2 13.3 13.2 13.2  HCT 37.2 39.8 40.6 39.7  MCV 94.7 93.6 96.2 93.0  PLT 181 223 215 205   Cardiac Enzymes:  Recent Labs Lab 09/27/13 0424 09/27/13 0707  TROPONINI <0.30 <0.30   BNP (last 3 results)  Recent Labs  10/01/13 1116  PROBNP 1521.0*   CBG: No results found for this basename: GLUCAP,  in the last 168 hours  No results found for this or any previous visit (from the past 240 hour(s)).   Studies: Dg Chest 2 View  10/01/2013   CLINICAL DATA:  80 year old female with shortness of Breath increasing. Initial encounter.  EXAM: CHEST  2 VIEW  COMPARISON:  09/27/2013 and earlier.  FINDINGS:  Stable large lung volumes. Stable cardiomegaly and mediastinal contours. Visualized tracheal air column is within normal limits. No pneumothorax. No pulmonary edema, pleural effusion or consolidation. Chronic increased interstitial markings are stable. Stable right breast surgical clip. No acute osseous abnormality identified.  IMPRESSION: No acute cardiopulmonary abnormality.   Electronically Signed   By: [name]   On: 10/01/2013 12:13   Ct Angio Chest Pe W/cm &/or Wo Cm  10/01/2013   CLINICAL DATA:  80 year old female with shortness of breath. Hypoxia. Initial encounter. History of breast cancer.  EXAM: CT ANGIOGRAPHY CHEST WITH CONTRAST  TECHNIQUE: Multidetector CT imaging of the chest was performed using the standard protocol during bolus administration of intravenous contrast. Multiplanar CT image reconstructions including MIPs were obtained to evaluate the vascular anatomy.  CONTRAST:  70m OMNIPAQUE IOHEXOL 350 MG/ML SOLN  COMPARISON:  Chest radiographs from the same day and earlier. CT Abdomen and Pelvis 09/24/2009.  FINDINGS: Good contrast bolus timing in the pulmonary arterial tree.  No focal filling defect identified in the pulmonary arterial tree to suggest the presence of acute pulmonary embolism.  Cardiomegaly, progressed. No pericardial effusion. No pleural effusion. Coronary artery calcified atherosclerosis. Aortic calcified atherosclerosis. Tortuous great vessels. No mediastinal lymphadenopathy. No axillary lymphadenopathy. No internal mammary lymphadenopathy.  Major airways are patent. Dependent pulmonary atelectasis. Additional bilateral ground-glass opacity, superimposed on upper lobe centrilobular emphysema. Mild smooth septal thickening. No consolidation.  12 mm thyroid nodule at the isthmus. No follow-up necessary ; this This follows ACR consensus guidelines: Managing Incidental Thyroid Nodules Detected on Imaging: White Paper of the ACR Incidental Thyroid Findings Committee. J AM  Coll Radiol, Published On-line: July 17, 2013.  Chronic polycystic liver disease appears stable since 2011. The entire liver is not visible. The gallbladder is surgically absent as before. Chronic right adrenal low-density lesion (benign adenoma) is stable. Left adrenal gland thickening is stable compatible with hyperplasia. At the left T10 lateral paraspinal soft tissue near the costovertebral junction there is a stable small indistinct soft tissue nodule, favor venous varicosity. Other visualized upper abdominal viscera are stable. Calcified atherosclerosis of the abdominal aorta.  No suspicious osseous lesion identified. There is a mild T7 compression fracture, appears to be chronic with mild endplate sclerosis. No acute osseous abnormality identified.  Review of the MIP images confirms the above findings.  IMPRESSION: 1.  No evidence of acute pulmonary embolus. 2. Cardiomegaly, increased since 2011. Ground-glass opacity in the lungs suggestive of pulmonary edema superimposed on emphysema. 3. Stable visualized upper abdominal viscera, including polycystic liver disease and right adrenal adenoma.  4. Atherosclerosis. 5. Chronic appearing mild T7 compression fracture.   Electronically Signed   By: [name]   On: 10/01/2013 14:48    Scheduled Meds: . aspirin EC  81 mg Oral QPM  . benazepril  40 mg Oral Daily  . enoxaparin (LOVENOX) injection  30 mg Subcutaneous Q24H  . furosemide  40 mg Oral Daily  . ipratropium-albuterol  3 mL Nebulization TID  . metoprolol succinate  25 mg Oral Daily  . multivitamin with minerals  1 tablet Oral QPM  . potassium chloride  10 mEq Oral Daily  . [START ON 10/03/2013] predniSONE  40 mg Oral Q breakfast  . sodium chloride  3 mL Intravenous Q12H  . sodium chloride  3 mL Intravenous Q12H  . verapamil  120 mg Oral BID   Continuous Infusions:    Charlynne Cousins  [facility] Pager [phone]. If 8PM-8AM, please contact night-coverage at [email or web address],  password [facility] 10/02/2013, 8:11 AM  LOS: 1 day

## 2013-10-02 NOTE — Progress Notes (Signed)
Report given to receiving RN. Patient is lying in bed, no verbal complaints and no sign or symptoms of distress or discomfort.

## 2013-10-02 NOTE — Progress Notes (Signed)
  Echocardiogram 2D Echocardiogram has been performed.  Mauricio Po 10/02/2013, 4:01 PM

## 2013-10-03 LAB — GLUCOSE, CAPILLARY
GLUCOSE-CAPILLARY: 96 mg/dL (ref 70–99)
GLUCOSE-CAPILLARY: 208 mg/dL — AB (ref 70–99)
Glucose-Capillary: 129 mg/dL — ABNORMAL HIGH (ref 70–99)
Glucose-Capillary: 155 mg/dL — ABNORMAL HIGH (ref 70–99)

## 2013-10-03 LAB — BASIC METABOLIC PANEL
BUN: 36 mg/dL — ABNORMAL HIGH (ref 6–23)
CO2: 26 meq/L (ref 19–32)
Calcium: 8.4 mg/dL (ref 8.4–10.5)
Chloride: 102 mEq/L (ref 96–112)
Creatinine, Ser: 1.14 mg/dL — ABNORMAL HIGH (ref 0.50–1.10)
GFR calc non Af Amer: 42 mL/min — ABNORMAL LOW (ref >90)
GFR, EST AFRICAN AMERICAN: 49 mL/min — AB (ref >90)
Glucose, Bld: 86 mg/dL (ref 70–99)
Potassium: 4.4 mEq/L (ref 3.7–5.3)
SODIUM: 140 meq/L (ref 137–147)

## 2013-10-03 MED ORDER — FAMOTIDINE 20 MG PO TABS
20.0000 mg | ORAL_TABLET | Freq: Once | ORAL | Status: AC
  Administered 2013-10-03: 20 mg via ORAL
  Filled 2013-10-03: qty 1

## 2013-10-03 MED ORDER — LORAZEPAM 0.5 MG PO TABS
0.5000 mg | ORAL_TABLET | Freq: Once | ORAL | Status: AC
  Administered 2013-10-03: 0.5 mg via ORAL
  Filled 2013-10-03: qty 1

## 2013-10-03 NOTE — Progress Notes (Signed)
Patient alert and oriented x4.  Patient experienced a bloody nose this morning, stating "my nose was so dry and I went to scratch it and it started bleeding".  Held pressure on nose until bleeding ceased and added humidifier to patient's oxygen.  Patient states she has no additional questions or concerns at this time.  Will continue to monitor.

## 2013-10-03 NOTE — Progress Notes (Signed)
SATURATION QUALIFICATIONS: (This note is used to comply with regulatory documentation for home oxygen)  Patient Saturations on Room Air at Rest = 96%  Patient Saturations on Room Air while Ambulating = 87%   Patient ambulated in hallway and oxygen saturation remained in 93-96% range for approximately 34ft on room air, then dropped to 87-89%.  Patient reported mild shortness of breath at this time.  Patient rested, and oxygen saturation rose to 93%-95% on room air for the return walk to the room.

## 2013-10-03 NOTE — Progress Notes (Signed)
[facility] PROGRESS NOTE Interim History: 80 y.o. female  With a history of COPD, bronchitis and 2014, breast cancer status post radiation, that presents to the emergency room for shortness of breath. Patient had arrived by EMS as she had gone to a local fire station she was not feeling well. At local fire station, her oxygen saturation was found to be 78%. She was placed on 2 L of nasal cannula, and upon coming to the emergency department was found to have an oxygen saturation 97%. Upon movement in the emergency department with nasal cannula, patient didexperience some desaturation  Filed Weights   10/01/13 1439 10/02/13 0500 10/03/13 0500  Weight: 78.6 kg (173 lb 4.5 oz) 79.7 kg (175 lb 11.3 oz) 81.3 kg (179 lb 3.7 oz)        Intake/Output Summary (Last 24 hours) at 10/03/13 0850 Last data filed at 10/03/13 0843  Gross per 24 hour  Intake    960 ml  Output   1750 ml  Net   -790 ml     Assessment/Plan: Respiratory failure, acute/Acute exacerbation of chronic obstructive pulmonary disease (COPD) - No JVD on Physical exam, moderate air movement, wheezing, start oral steroids. No orthopnea. - Start doxycycline. Cont Inhalers. - CT showed mild edema, but renal function up with lasix, Chloride <100 and mouth is dry. - Unlikely HF, most likely COPD exacerbation. - Gentle IV hydration , renal fialure improved.  Hypertension: - BP well controlled. - resume home meds.  AKI (acute kidney injury) - Multifactorial due to lasix, ACE-I and NSAID's. - D/c NSAID's, - Now resolved.   Hyperglycemia: - unclear if DM, HBgA1c, 6.1. - BG high due to steroids.  Code Status: full Family Communication: none  Disposition Plan: inpatient   Consultants:  none  Procedures: ECHO: pending  Antibiotics:  Doxycycline 1.17.2014  HPI/Subjective: Relates SOB is improved. Still feels SOB when walking to bathroom.  Objective: Filed Vitals:   10/02/13 1957 10/03/13 0136 10/03/13 0500  10/03/13 0724  BP: 133/47 143/58 166/67   Pulse: 88 77 80   Temp: 98.3 F (36.8 C) 98.4 F (36.9 C) 98.3 F (36.8 C)   TempSrc: Oral Oral Oral   Resp: 20 20 20    Height:      Weight:   81.3 kg (179 lb 3.7 oz)   SpO2: 92%  94% 97%     Exam:  General: Alert, awake, oriented x3, in no acute distress.  HEENT: No bruits, no goiter. NO JVD Heart: Regular rate and rhythm, without murmurs, rubs, gallops.  Lungs: moderate air movement, bilateral air movement.  Abdomen: Soft, nontender, nondistended, positive bowel sounds.    Data Reviewed: Basic Metabolic Panel:  Recent Labs Lab 09/27/13 0423 10/01/13 1115 10/01/13 1614 10/02/13 0440 10/03/13 0525  NA 141 139  --  139 140  K 3.8 3.9  --  4.2 4.4  CL 98 96  --  98 102  CO2 32 30  --  25 26  GLUCOSE 106* 107*  --  160* 86  BUN 27* 30*  --  41* 36*  CREATININE 1.34* 1.32* 1.38* 1.47* 1.14*  CALCIUM 12.2* 10.7*  --  10.1 8.4  MG  --   --  2.1  --   --   PHOS  --   --  2.6  --   --    Liver Function Tests: No results found for this basename: AST, ALT, ALKPHOS, BILITOT, PROT, ALBUMIN,  in the last 168 hours No results found for  this basename: LIPASE, AMYLASE,  in the last 168 hours No results found for this basename: AMMONIA,  in the last 168 hours CBC:  Recent Labs Lab 09/27/13 0423 10/01/13 1115 10/01/13 1614 10/02/13 0440  WBC 9.4 11.7* 9.0 10.1  NEUTROABS 6.4 8.7*  --   --   HGB 12.2 13.3 13.2 13.2  HCT 37.2 39.8 40.6 39.7  MCV 94.7 93.6 96.2 93.0  PLT 181 223 215 205   Cardiac Enzymes:  Recent Labs Lab 09/27/13 0424 09/27/13 0707  TROPONINI <0.30 <0.30   BNP (last 3 results)  Recent Labs  10/01/13 1116  PROBNP 1521.0*   CBG:  Recent Labs Lab 10/02/13 1204 10/02/13 1606 10/02/13 2110 10/03/13 0707  GLUCAP 123* 144* 140* 96    No results found for this or any previous visit (from the past 240 hour(s)).   Studies: Dg Chest 2 View  10/01/2013   CLINICAL DATA:  80 year old female with  shortness of Breath increasing. Initial encounter.  EXAM: CHEST  2 VIEW  COMPARISON:  09/27/2013 and earlier.  FINDINGS: Stable large lung volumes. Stable cardiomegaly and mediastinal contours. Visualized tracheal air column is within normal limits. No pneumothorax. No pulmonary edema, pleural effusion or consolidation. Chronic increased interstitial markings are stable. Stable right breast surgical clip. No acute osseous abnormality identified.  IMPRESSION: No acute cardiopulmonary abnormality.   Electronically Signed   By: [name]   On: 10/01/2013 12:13   Ct Angio Chest Pe W/cm &/or Wo Cm  10/01/2013   CLINICAL DATA:  80 year old female with shortness of breath. Hypoxia. Initial encounter. History of breast cancer.  EXAM: CT ANGIOGRAPHY CHEST WITH CONTRAST  TECHNIQUE: Multidetector CT imaging of the chest was performed using the standard protocol during bolus administration of intravenous contrast. Multiplanar CT image reconstructions including MIPs were obtained to evaluate the vascular anatomy.  CONTRAST:  39mL OMNIPAQUE IOHEXOL 350 MG/ML SOLN  COMPARISON:  Chest radiographs from the same day and earlier. CT Abdomen and Pelvis 09/24/2009.  FINDINGS: Good contrast bolus timing in the pulmonary arterial tree.  No focal filling defect identified in the pulmonary arterial tree to suggest the presence of acute pulmonary embolism.  Cardiomegaly, progressed. No pericardial effusion. No pleural effusion. Coronary artery calcified atherosclerosis. Aortic calcified atherosclerosis. Tortuous great vessels. No mediastinal lymphadenopathy. No axillary lymphadenopathy. No internal mammary lymphadenopathy.  Major airways are patent. Dependent pulmonary atelectasis. Additional bilateral ground-glass opacity, superimposed on upper lobe centrilobular emphysema. Mild smooth septal thickening. No consolidation.  12 mm thyroid nodule at the isthmus. No follow-up necessary ; this This follows ACR consensus guidelines:  Managing Incidental Thyroid Nodules Detected on Imaging: White Paper of the ACR Incidental Thyroid Findings Committee. J AM Coll Radiol, Published On-line: July 17, 2013.  Chronic polycystic liver disease appears stable since 2011. The entire liver is not visible. The gallbladder is surgically absent as before. Chronic right adrenal low-density lesion (benign adenoma) is stable. Left adrenal gland thickening is stable compatible with hyperplasia. At the left T10 lateral paraspinal soft tissue near the costovertebral junction there is a stable small indistinct soft tissue nodule, favor venous varicosity. Other visualized upper abdominal viscera are stable. Calcified atherosclerosis of the abdominal aorta.  No suspicious osseous lesion identified. There is a mild T7 compression fracture, appears to be chronic with mild endplate sclerosis. No acute osseous abnormality identified.  Review of the MIP images confirms the above findings.  IMPRESSION: 1.  No evidence of acute pulmonary embolus. 2. Cardiomegaly, increased since 2011. Ground-glass opacity in  the lungs suggestive of pulmonary edema superimposed on emphysema. 3. Stable visualized upper abdominal viscera, including polycystic liver disease and right adrenal adenoma. 4. Atherosclerosis. 5. Chronic appearing mild T7 compression fracture.   Electronically Signed   By: [name]   On: 10/01/2013 14:48    Scheduled Meds: . aspirin EC  81 mg Oral QPM  . benazepril  40 mg Oral Daily  . doxycycline  100 mg Oral Q12H  . enoxaparin (LOVENOX) injection  30 mg Subcutaneous Q24H  . furosemide  40 mg Oral Daily  . ipratropium-albuterol  3 mL Nebulization TID  . metoprolol succinate  25 mg Oral Daily  . multivitamin with minerals  1 tablet Oral QPM  . potassium chloride  10 mEq Oral Daily  . predniSONE  40 mg Oral Q breakfast  . verapamil  120 mg Oral BID   Continuous Infusions: . sodium chloride 75 mL/hr at 10/02/13 [place],  Wandalee Klang  [facility] Pager [phone]. If 8PM-8AM, please contact night-coverage at [email or web address], password [facility] 10/03/2013, 8:50 AM  LOS: 2 days

## 2013-10-03 NOTE — Progress Notes (Signed)
Patient alert and oriented x4.  Patient having frequent PACs on tele; MD notified.

## 2013-10-04 LAB — GLUCOSE, CAPILLARY
GLUCOSE-CAPILLARY: 156 mg/dL — AB (ref 70–99)
Glucose-Capillary: 93 mg/dL (ref 70–99)

## 2013-10-04 MED ORDER — TIOTROPIUM BROMIDE MONOHYDRATE 18 MCG IN CAPS: 18.0000 ug | ORAL_CAPSULE | Freq: Every day | RESPIRATORY_TRACT | Status: AC

## 2013-10-04 MED ORDER — ALBUTEROL SULFATE HFA 108 (90 BASE) MCG/ACT IN AERS: 2.0000 | INHALATION_SPRAY | Freq: Four times a day (QID) | RESPIRATORY_TRACT | Status: AC | PRN

## 2013-10-04 MED ORDER — PREDNISONE 10 MG PO TABS: ORAL_TABLET | ORAL | Status: DC

## 2013-10-04 MED ORDER — DOXYCYCLINE HYCLATE 100 MG PO TABS: 100.0000 mg | ORAL_TABLET | Freq: Two times a day (BID) | ORAL | Status: DC

## 2013-10-04 MED ORDER — ENOXAPARIN SODIUM 40 MG/0.4ML ~~LOC~~ SOLN
40.0000 mg | SUBCUTANEOUS | Status: DC
  Filled 2013-10-04: qty 0.4

## 2013-10-04 NOTE — Progress Notes (Signed)
SATURATION QUALIFICATIONS: (This note is used to comply with regulatory documentation for home oxygen)  Patient Saturations on Room Air at Rest = 99%  Patient Saturations on Room Air while Ambulating = 95%   Patient ambulated from room to nurse's station and her oxygen saturation remained in the 95-99% range on room air.  Patient reported some shortness of breath, but it resolved with rest.

## 2013-10-04 NOTE — Progress Notes (Signed)
Patient discharged to home with family.  IV removed prior to discharge; IV site clean, dry, and intact.  Discharge education discussed with patient and patient's son-in-law.  Patient and son-in-law voiced understanding of discharge instructions, education, and medications.

## 2013-10-04 NOTE — Discharge Summary (Signed)
Physician Discharge Summary  Adrienne [name] [id number] DOB: 12/09/1923 DOA: 10/01/2013  PCP: Thressa Sheller, MD  Admit date: 10/01/2013 Discharge date: 10/04/2013  Time spent: Less than 30 minutes  Recommendations for Outpatient Follow-up:  1. Dr. Trilby Drummer, PCP in 3 days-to be seen with repeat labs (BMP). 2. Echocardiogram with incidental finding as below ( 5-6 cm irregular septated hypoechoic structure in the left lobe of the liver, that does not appear to be a simple cyst). Recommend outpatient evaluation through PCP, as deemed necessary.  Discharge Diagnoses:  Principal Problem:   Acute exacerbation of chronic obstructive pulmonary disease (COPD) Active Problems:   Hypertension   AKI (acute kidney injury)   COPD (chronic obstructive pulmonary disease)   Diabetes   Elevated brain natriuretic peptide (BNP) level   Respiratory failure, acute   Discharge Condition: Improved & Stable  Diet recommendation: Heart Healthy diet.  Filed Weights   10/02/13 0500 10/03/13 0500 10/04/13 0528  Weight: 79.7 kg (175 lb 11.3 oz) 81.3 kg (179 lb 3.7 oz) 82.237 kg (181 lb 4.8 oz)    History of present illness:  80 y.o. female with a history of COPD-not on home oxygen but uses Spiriva and when necessary inhaler-? Albuterol, bronchitis and 2014, breast cancer status post radiation, who presented to the emergency room for shortness of breath. Patient had arrived by EMS as she had gone to a local fire station she was not feeling well. At local fire station, her oxygen saturation was found to be 78%. She was placed on 2 L of nasal cannula, and upon coming to the emergency department was found to have an oxygen saturation 97%. Upon movement in the emergency department with nasal cannula, patient didexperience some desaturation   Hospital Course:   COPD exacerbation/acute respiratory failure with hypoxia - She was treated with oxygen, bronchodilator nebulizations, steroids and oral  doxycycline. - She did receive a dose of IV Lasix because CT chest showed mild edema. However her clinical picture was most likely consistent with COPD exacerbation. She may have had a small element of acute diastolic CHF which improved after Lasix and currently is clinically euvolemic. Given her recent AKI, will not DC on Lasix- this can be re evaluated during OP follow up with PCP - She has clinically improved. Today she ambulated with oxygen saturations in the mid 90s while of oxygen. - She will be discharged with oral prednisone taper and complete 7 days of total oral doxycycline. She states that she has Spiriva at home that she uses daily and an inhaler? Albuterol that she uses when necessary.  Hypertension - Mildly uncontrolled. Continue home medications and outpatient followup with PCP  Acute kidney injury - Likely secondary to Lasix, ACE inhibitor and NSAIDs. Resolved. Resume home medications except NSAIDs. Followup BMP in 3 days.  Hyperglycemia - Hemoglobin A1c 6.1. Likely precipitated by steroids and should get better with tapering down off steroids.   Polycystic liver disease and right adrenal adenoma-seen on CT. - Outpatient followup as deemed necessary  Consultations:  None  Procedures:  None    Discharge Exam:  Complaints:  Denies dyspnea, cough, chest pain or any other complaints and requesting to go home.  Filed Vitals:   10/03/13 2102 10/04/13 0528 10/04/13 0747 10/04/13 0941  BP: 156/67 167/71  183/80  Pulse: 76 78  87  Temp: 98 F (36.7 C) 97.9 F (36.6 C)    TempSrc: Oral Oral    Resp: 18 18    Height:  Weight:  82.237 kg (181 lb 4.8 oz)    SpO2: 94% 99% 96% 97%    General exam: Elderly female sitting comfortably at edge of bed in no obvious distress. Respiratory system: Clear. No increased work of breathing. Cardiovascular system: S1 & S2 heard, RRR. No JVD, murmurs, gallops, clicks or pedal edema. Telemetry: Sinus rhythm with occasional PACs  and PVCs. Gastrointestinal system: Abdomen is nondistended, soft and nontender. Normal bowel sounds heard. Central nervous system: Alert and oriented. No focal neurological deficits. Extremities: Symmetric 5 x 5 power.  Discharge Instructions      Discharge Orders   Future Orders Complete By Expires   Call MD for:  difficulty breathing, headache or visual disturbances  As directed    Call MD for:  extreme fatigue  As directed    Call MD for:  persistant dizziness or light-headedness  As directed    Diet - low sodium heart healthy  As directed    Increase activity slowly  As directed        Medication List    STOP taking these medications       naproxen sodium 220 MG tablet  Commonly known as:  ANAPROX      TAKE these medications       ALAWAY OP  Apply 1 drop to eye 2 (two) times daily as needed. For itchy eyes     albuterol 108 (90 BASE) MCG/ACT inhaler  Commonly known as:  PROVENTIL HFA;VENTOLIN HFA  Inhale 2 puffs into the lungs every 6 (six) hours as needed for wheezing or shortness of breath.     aspirin EC 81 MG tablet  Take 81 mg by mouth every evening.     benazepril 40 MG tablet  Commonly known as:  LOTENSIN  Take 40 mg by mouth daily.     CALCIUM PO  Take 1 tablet by mouth 2 (two) times daily.     COQ10 PO  Take 10 mLs by mouth daily.     doxycycline 100 MG tablet  Commonly known as:  VIBRA-TABS  Take 1 tablet (100 mg total) by mouth every 12 (twelve) hours.     FISH OIL PO  Take 1 capsule by mouth every evening.     furosemide 40 MG tablet  Commonly known as:  LASIX  Take 40 mg by mouth daily.     metoprolol succinate 50 MG 24 hr tablet  Commonly known as:  TOPROL-XL  Take 25 mg by mouth daily. Take with or immediately following a meal.     multivitamin with minerals Tabs tablet  Take 1 tablet by mouth every evening.     potassium chloride 10 MEQ tablet  Commonly known as:  K-DUR,KLOR-CON  Take 10 mEq by mouth daily.     predniSONE 10  MG tablet  Commonly known as:  DELTASONE  Take 4 tabs daily x3 days, then 3 tabs daily x3 days, then 2 tabs daily x3 days, then 1 tab daily x3 days, then stop     PRESERVISION AREDS PO  Take 1 tablet by mouth 2 (two) times daily.     tiotropium 18 MCG inhalation capsule  Commonly known as:  SPIRIVA HANDIHALER  Place 1 capsule (18 mcg total) into inhaler and inhale daily.     verapamil 120 MG tablet  Commonly known as:  CALAN  Take 120 mg by mouth 2 (two) times daily.     VITAMIN D PO  Take 1 capsule by mouth every evening.  Follow-up Information   Follow up with Thressa Sheller, MD. Schedule an appointment as soon as possible for a visit in 3 days. (To be seen with repeat labs [facility]))    Specialty:  Internal Medicine   Contact information:   [address] [place] [address] [phone]        The results of significant diagnostics from this hospitalization (including imaging, microbiology, ancillary and laboratory) are listed below for reference.    Significant Diagnostic Studies: Dg Chest 2 View  10/01/2013   CLINICAL DATA:  80 year old female with shortness of Breath increasing. Initial encounter.  EXAM: CHEST  2 VIEW  COMPARISON:  09/27/2013 and earlier.  FINDINGS: Stable large lung volumes. Stable cardiomegaly and mediastinal contours. Visualized tracheal air column is within normal limits. No pneumothorax. No pulmonary edema, pleural effusion or consolidation. Chronic increased interstitial markings are stable. Stable right breast surgical clip. No acute osseous abnormality identified.  IMPRESSION: No acute cardiopulmonary abnormality.   Electronically Signed   By: [name]   On: 10/01/2013 12:13   Ct Angio Chest Pe W/cm &/or Wo Cm  10/01/2013   CLINICAL DATA:  80 year old female with shortness of breath. Hypoxia. Initial encounter. History of breast cancer.  EXAM: CT ANGIOGRAPHY CHEST WITH CONTRAST  TECHNIQUE: Multidetector CT imaging of the  chest was performed using the standard protocol during bolus administration of intravenous contrast. Multiplanar CT image reconstructions including MIPs were obtained to evaluate the vascular anatomy.  CONTRAST:  52mL OMNIPAQUE IOHEXOL 350 MG/ML SOLN  COMPARISON:  Chest radiographs from the same day and earlier. CT Abdomen and Pelvis 09/24/2009.  FINDINGS: Good contrast bolus timing in the pulmonary arterial tree.  No focal filling defect identified in the pulmonary arterial tree to suggest the presence of acute pulmonary embolism.  Cardiomegaly, progressed. No pericardial effusion. No pleural effusion. Coronary artery calcified atherosclerosis. Aortic calcified atherosclerosis. Tortuous great vessels. No mediastinal lymphadenopathy. No axillary lymphadenopathy. No internal mammary lymphadenopathy.  Major airways are patent. Dependent pulmonary atelectasis. Additional bilateral ground-glass opacity, superimposed on upper lobe centrilobular emphysema. Mild smooth septal thickening. No consolidation.  12 mm thyroid nodule at the isthmus. No follow-up necessary ; this This follows ACR consensus guidelines: Managing Incidental Thyroid Nodules Detected on Imaging: White Paper of the ACR Incidental Thyroid Findings Committee. J AM Coll Radiol, Published On-line: July 17, 2013.  Chronic polycystic liver disease appears stable since 2011. The entire liver is not visible. The gallbladder is surgically absent as before. Chronic right adrenal low-density lesion (benign adenoma) is stable. Left adrenal gland thickening is stable compatible with hyperplasia. At the left T10 lateral paraspinal soft tissue near the costovertebral junction there is a stable small indistinct soft tissue nodule, favor venous varicosity. Other visualized upper abdominal viscera are stable. Calcified atherosclerosis of the abdominal aorta.  No suspicious osseous lesion identified. There is a mild T7 compression fracture, appears to be chronic with  mild endplate sclerosis. No acute osseous abnormality identified.  Review of the MIP images confirms the above findings.  IMPRESSION: 1.  No evidence of acute pulmonary embolus. 2. Cardiomegaly, increased since 2011. Ground-glass opacity in the lungs suggestive of pulmonary edema superimposed on emphysema. 3. Stable visualized upper abdominal viscera, including polycystic liver disease and right adrenal adenoma. 4. Atherosclerosis. 5. Chronic appearing mild T7 compression fracture.   Electronically Signed   By: [name]   On: 10/01/2013 14:48   Dg Chest Port 1 View  09/27/2013   CLINICAL DATA:  Sudden onset chest  pain.  EXAM: PORTABLE CHEST - 1 VIEW  COMPARISON:  09/20/2013.  FINDINGS: Prominent markings at the bases is a chronic finding. No definite consolidation or edema. The right heart border is less well defined, but this is likely related to rightward rotation. No effusion or pneumothorax. Stable, normal heart size.  IMPRESSION: Stable exam.  No evidence of acute cardiopulmonary disease.   Electronically Signed   By: Jorje Guild M.D.   On: 09/27/2013 03:58    Microbiology: No results found for this or any previous visit (from the past 240 hour(s)).   Labs: Basic Metabolic Panel:  Recent Labs Lab 10/01/13 1115 10/01/13 1614 10/02/13 0440 10/03/13 0525  NA 139  --  139 140  K 3.9  --  4.2 4.4  CL 96  --  98 102  CO2 30  --  25 26  GLUCOSE 107*  --  160* 86  BUN 30*  --  41* 36*  CREATININE 1.32* 1.38* 1.47* 1.14*  CALCIUM 10.7*  --  10.1 8.4  MG  --  2.1  --   --   PHOS  --  2.6  --   --    Liver Function Tests: No results found for this basename: AST, ALT, ALKPHOS, BILITOT, PROT, ALBUMIN,  in the last 168 hours No results found for this basename: LIPASE, AMYLASE,  in the last 168 hours No results found for this basename: AMMONIA,  in the last 168 hours CBC:  Recent Labs Lab 10/01/13 1115 10/01/13 1614 10/02/13 0440  WBC 11.7* 9.0 10.1  NEUTROABS 8.7*  --   --    HGB 13.3 13.2 13.2  HCT 39.8 40.6 39.7  MCV 93.6 96.2 93.0  PLT 223 215 205   Cardiac Enzymes: No results found for this basename: CKTOTAL, CKMB, CKMBINDEX, TROPONINI,  in the last 168 hours BNP: BNP (last 3 results)  Recent Labs  10/01/13 1116  PROBNP 1521.0*   CBG:  Recent Labs Lab 10/03/13 1118 10/03/13 1558 10/03/13 2129 10/04/13 0626 10/04/13 1043  GLUCAP 129* 208* 155* 93 156*    Additional labs: 1. ABG on admission: PH 7.51, PCO2 41, PO2 90, bicarbonate 33 and oxygen saturation 90% 2. Hemoglobin A1c: 6.1 3. TSH: 1.364 4. 2-D echo 10/02/13: Study Conclusions  - Left ventricle: The cavity size was normal. Wall thickness was increased in a pattern of mild LVH. Systolic function was normal. The estimated ejection fraction was in the range of 55% to 60%. Wall motion was normal; there were no regional wall motion abnormalities. Features are consistent with a pseudonormal left ventricular filling pattern, with concomitant abnormal relaxation and increased filling pressure (grade 2 diastolic dysfunction). - Pulmonary arteries: PA peak pressure: 109mm Hg (S). Impressions:  - There is a 5-6 cm irregular septated hypoechoic structure in the left lobe of the liver, that does not appear to be a simple cyst. Dedicated imaging is recommended.     Signed:  Vernell Leep, MD, FACP, FHM. [facility] Pager [phone]  If 7PM-7AM, please contact night-coverage [email or web address] Password [email or web address] 10/04/2013, 3:04 PM

## 2013-10-04 NOTE — Progress Notes (Signed)
Patient evaluated for community based chronic disease management services with [facility] as a benefit of patient's [employer]. Spoke with patient at bedside to explain [facility] services.  Written consents obtained.  Patient needs assistance with her acute management plan for COPD.  She reached out to her PCP regarding her symptoms but was not able to get a same day appointment so in turn she came to the ED for treatment.  Patient will receive a post discharge transition of care call and will be evaluated for monthly home visits for assessments and disease process education.  Left contact information and [facility] literature at bedside. Made Inpatient Case Manager aware that [facility] following. Of note, [facility] Care Management services does not replace or interfere with any services that are arranged by inpatient case management or social work.  For additional questions or referrals please contact Corliss Blacker BSN RN [facility] Liaison at [phone].

## 2013-10-04 NOTE — Progress Notes (Signed)
Inpatient Diabetes Program Recommendations  AACE/ADA: New Consensus Statement on Inpatient Glycemic Control (2013)  Target Ranges:  Prepandial:   less than 140 mg/dL      Peak postprandial:   less than 180 mg/dL (1-2 hours)      Critically ill patients:  140 - 180 mg/dL   Reason for Visit: Hx DM  Results for DELPHINA, SCHUM (MRN [id number]) as of 10/04/2013 14:05  Ref. Range 10/03/2013 11:18 10/03/2013 15:58 10/03/2013 21:29 10/04/2013 06:26 10/04/2013 10:43  Glucose-Capillary Latest Range: 70-99 mg/dL 129 (H) 208 (H) 155 (H) 93 156 (H)    Inpatient Diabetes Program Recommendations Correction (SSI): Add Novolog sensitive tidwc  Note: Will follow while inpatient. Thank you. Lorenda Peck, RD, LDN, CDE Inpatient Diabetes Coordinator [phone]

## 2013-10-04 NOTE — Evaluation (Signed)
Physical Therapy Evaluation Patient Details Name: ATHEENA SPANO MRN: [id number] DOB: 30-Aug-1924 Today's Date: 10/04/2013 Time: 2458-0998 PT Time Calculation (min): 29 min  PT Assessment / Plan / Recommendation History of Present Illness    With a history of COPD, bronchitis and 2014, breast cancer status post radiation, that presents to the emergency room for shortness of breath. Patient had arrived by EMS as she had gone to a local fire station she was not feeling well. At local fire station, her oxygen saturation was found to be 78%. She was placed on 2 L of nasal cannula, and upon coming to the emergency department was found to have an oxygen saturation 97%. Upon movement in the emergency department with nasal cannula, patient didexperience some desaturation   Clinical Impression  Pt admitted with above. Pt currently with functional limitations due to the deficits listed below (see PT Problem List). Pt will need to use her RW at all times initially upon d/c and pt will need HHPT f/u. Pt in agreement.   Pt did not desat with ambulation on RA.  Pt will benefit from skilled PT to increase their independence and safety with mobility to allow discharge to the venue listed below.    PT Assessment  Patient needs continued PT services    Follow Up Recommendations  Home health PT;Supervision - Intermittent                Equipment Recommendations  None recommended by PT         Frequency Min 3X/week    Precautions / Restrictions Precautions Precautions: Fall Restrictions Weight Bearing Restrictions: No   Pertinent Vitals/Pain Pt sats documented in separate note.  Did not go below 92% on RA.  DOE 3/4 with ambulation.  Other VSS, no pain      Mobility  Bed Mobility Overal bed mobility: Independent Transfers Overall transfer level: Needs assistance Equipment used: None Transfers: Sit to/from Stand Sit to Stand: Supervision General transfer comment: Pt was settled on the bed  sitting at end of session and pt wanted to go to bathroom therefore PT guarded pt to ambulate to bathroom.  Pt cleaned herself.   Ambulation/Gait Ambulation/Gait assistance: Min guard;Supervision Ambulation Distance (Feet): 175 Feet Assistive device: None Gait Pattern/deviations: Step-through pattern;Decreased stride length Gait velocity interpretation: <1.8 ft/sec, indicative of risk for recurrent falls General Gait Details: Pt with fair safety in the room.  does reach for furniture but is not grossly unsteady.  Pt encouraged to use RW for safety upon intial d/c home with pt in agrrement.           PT Diagnosis: Generalized weakness  PT Problem List: Decreased activity tolerance;Decreased balance;Decreased mobility;Decreased knowledge of use of DME;Decreased safety awareness PT Treatment Interventions: DME instruction;Gait training;Functional mobility training;Therapeutic activities;Therapeutic exercise;Balance training;Patient/family education     PT Goals(Current goals can be found in the care plan section) Acute Rehab PT Goals Patient Stated Goal: to go home PT Goal Formulation: With patient Time For Goal Achievement: 10/11/13 Potential to Achieve Goals: Good  Visit Information  Last PT Received On: 10/04/13 Assistance Needed: +1       Prior [place] expects to be discharged to:: Private residence Living Arrangements: Alone Available Help at Discharge: Family;Available PRN/intermittently Type of Home: House Home Access: Ramped entrance Home Layout: One level Home Equipment: Cane - single point;Walker - 2 wheels;Bedside commode;Shower seat;Wheelchair - manual Prior Function Level of Independence: Independent Communication Communication: No difficulties;HOH    Cognition  Cognition Arousal/Alertness: Awake/alert Behavior During Therapy: WFL for tasks assessed/performed Overall Cognitive Status: Within Functional Limits for tasks assessed     Extremity/Trunk Assessment Upper Extremity Assessment Upper Extremity Assessment: Defer to OT evaluation Lower Extremity Assessment Lower Extremity Assessment: Generalized weakness   Balance Balance Overall balance assessment: Needs assistance Sitting-balance support: Feet supported;No upper extremity supported Sitting balance-Leahy Scale: Good Standing balance support: No upper extremity supported;During functional activity Standing balance-Leahy Scale: Fair Standing balance comment: Overall does fairly well with static stance.  Cannot accept challenges which is why RW is recommended initially.   High level balance activites: Direction changes;Turns;Sudden stops High Level Balance Comments: Activities above with min guard assist without device.    End of Session PT - End of Session Equipment Utilized During Treatment: Gait belt Activity Tolerance: Patient limited by fatigue Patient left: in bed;with call bell/phone within reach Nurse Communication: Mobility status       INGOLD,Arinze Rivadeneira 10/04/2013, 1:33 PM [facility] Acute Rehabilitation [phone] (pager)

## 2013-10-04 NOTE — Progress Notes (Signed)
SATURATION QUALIFICATIONS: (This note is used to comply with regulatory documentation for home oxygen)  Patient Saturations on Room Air at Rest = 96%  Patient Saturations on Room Air while Ambulating = 92%  Patient did not desat on room air with ambulation.  Does not need home O2.    Will need HHPT f/u.  Full note to follow.   Thanks. [facility] Acute Rehabilitation [phone] (pager)

## 2013-10-14 ENCOUNTER — Inpatient Hospital Stay (HOSPITAL_COMMUNITY)
Admission: EM | Admit: 2013-10-14 | Discharge: 2013-10-17 | DRG: 392 | Disposition: A | Discharge status: Home / Self Care | Payer: Medicare Other | Attending: Internal Medicine | Admitting: Internal Medicine

## 2013-10-14 ENCOUNTER — Encounter (HOSPITAL_COMMUNITY): Payer: Self-pay | Admitting: Emergency Medicine

## 2013-10-14 ENCOUNTER — Emergency Department (HOSPITAL_COMMUNITY): Payer: Medicare Other

## 2013-10-14 DIAGNOSIS — E279 Disorder of adrenal gland, unspecified: Secondary | ICD-10-CM | POA: Diagnosis present

## 2013-10-14 DIAGNOSIS — Z79899 Other long term (current) drug therapy: Secondary | ICD-10-CM

## 2013-10-14 DIAGNOSIS — Z880 Allergy status to penicillin: Secondary | ICD-10-CM

## 2013-10-14 DIAGNOSIS — Z853 Personal history of malignant neoplasm of breast: Secondary | ICD-10-CM

## 2013-10-14 DIAGNOSIS — Z87891 Personal history of nicotine dependence: Secondary | ICD-10-CM

## 2013-10-14 DIAGNOSIS — Z887 Allergy status to serum and vaccine status: Secondary | ICD-10-CM

## 2013-10-14 DIAGNOSIS — Q446 Cystic disease of liver: Secondary | ICD-10-CM

## 2013-10-14 DIAGNOSIS — D72829 Elevated white blood cell count, unspecified: Secondary | ICD-10-CM | POA: Diagnosis present

## 2013-10-14 DIAGNOSIS — M129 Arthropathy, unspecified: Secondary | ICD-10-CM | POA: Diagnosis present

## 2013-10-14 DIAGNOSIS — R197 Diarrhea, unspecified: Secondary | ICD-10-CM | POA: Diagnosis present

## 2013-10-14 DIAGNOSIS — Z91040 Latex allergy status: Secondary | ICD-10-CM

## 2013-10-14 DIAGNOSIS — T380X5A Adverse effect of glucocorticoids and synthetic analogues, initial encounter: Secondary | ICD-10-CM | POA: Diagnosis present

## 2013-10-14 DIAGNOSIS — Z923 Personal history of irradiation: Secondary | ICD-10-CM

## 2013-10-14 DIAGNOSIS — Z7982 Long term (current) use of aspirin: Secondary | ICD-10-CM

## 2013-10-14 DIAGNOSIS — I10 Essential (primary) hypertension: Secondary | ICD-10-CM | POA: Diagnosis present

## 2013-10-14 DIAGNOSIS — J449 Chronic obstructive pulmonary disease, unspecified: Secondary | ICD-10-CM | POA: Diagnosis present

## 2013-10-14 DIAGNOSIS — R5383 Other fatigue: Secondary | ICD-10-CM

## 2013-10-14 DIAGNOSIS — E119 Type 2 diabetes mellitus without complications: Secondary | ICD-10-CM | POA: Diagnosis present

## 2013-10-14 DIAGNOSIS — J4489 Other specified chronic obstructive pulmonary disease: Secondary | ICD-10-CM | POA: Diagnosis present

## 2013-10-14 DIAGNOSIS — R5381 Other malaise: Secondary | ICD-10-CM | POA: Diagnosis present

## 2013-10-14 DIAGNOSIS — R112 Nausea with vomiting, unspecified: Principal | ICD-10-CM | POA: Diagnosis present

## 2013-10-14 DIAGNOSIS — R531 Weakness: Secondary | ICD-10-CM

## 2013-10-14 DIAGNOSIS — E860 Dehydration: Secondary | ICD-10-CM | POA: Diagnosis present

## 2013-10-14 LAB — CBC WITH DIFFERENTIAL/PLATELET
Basophils Absolute: 0 10*3/uL (ref 0.0–0.1)
Basophils Relative: 0 % (ref 0–1)
EOS ABS: 0 10*3/uL (ref 0.0–0.7)
Eosinophils Relative: 0 % (ref 0–5)
HCT: 37.3 % (ref 36.0–46.0)
HEMOGLOBIN: 12.3 g/dL (ref 12.0–15.0)
LYMPHS ABS: 1.3 10*3/uL (ref 0.7–4.0)
LYMPHS PCT: 8 % — AB (ref 12–46)
MCH: 31 pg (ref 26.0–34.0)
MCHC: 33 g/dL (ref 30.0–36.0)
MCV: 94 fL (ref 78.0–100.0)
MONOS PCT: 7 % (ref 3–12)
Monocytes Absolute: 1.1 10*3/uL — ABNORMAL HIGH (ref 0.1–1.0)
NEUTROS PCT: 84 % — AB (ref 43–77)
Neutro Abs: 12.9 10*3/uL — ABNORMAL HIGH (ref 1.7–7.7)
PLATELETS: 156 10*3/uL (ref 150–400)
RBC: 3.97 MIL/uL (ref 3.87–5.11)
RDW: 16.2 % — ABNORMAL HIGH (ref 11.5–15.5)
WBC: 15.3 10*3/uL — AB (ref 4.0–10.5)

## 2013-10-14 LAB — COMPREHENSIVE METABOLIC PANEL
ALT: 40 U/L — ABNORMAL HIGH (ref 0–35)
AST: 20 U/L (ref 0–37)
Albumin: 2.5 g/dL — ABNORMAL LOW (ref 3.5–5.2)
Alkaline Phosphatase: 70 U/L (ref 39–117)
BUN: 24 mg/dL — ABNORMAL HIGH (ref 6–23)
CALCIUM: 7.9 mg/dL — AB (ref 8.4–10.5)
CO2: 20 mEq/L (ref 19–32)
CREATININE: 0.88 mg/dL (ref 0.50–1.10)
Chloride: 102 mEq/L (ref 96–112)
GFR calc non Af Amer: 57 mL/min — ABNORMAL LOW (ref >90)
GFR, EST AFRICAN AMERICAN: 67 mL/min — AB (ref >90)
GLUCOSE: 140 mg/dL — AB (ref 70–99)
Potassium: 3.9 mEq/L (ref 3.7–5.3)
SODIUM: 136 meq/L — AB (ref 137–147)
Total Bilirubin: 0.3 mg/dL (ref 0.3–1.2)
Total Protein: 5.7 g/dL — ABNORMAL LOW (ref 6.0–8.3)

## 2013-10-14 LAB — LIPASE, BLOOD: Lipase: 19 U/L (ref 11–59)

## 2013-10-14 MED ORDER — IOHEXOL 300 MG/ML  SOLN
100.0000 mL | Freq: Once | INTRAMUSCULAR | Status: AC | PRN
  Administered 2013-10-14: 100 mL via INTRAVENOUS

## 2013-10-14 MED ORDER — SODIUM CHLORIDE 0.9 % IV SOLN
INTRAVENOUS | Status: DC
  Administered 2013-10-14: 23:00:00 via INTRAVENOUS

## 2013-10-14 MED ORDER — ONDANSETRON HCL 4 MG/2ML IJ SOLN
4.0000 mg | Freq: Once | INTRAMUSCULAR | Status: AC
  Administered 2013-10-14: 4 mg via INTRAVENOUS
  Filled 2013-10-14: qty 2

## 2013-10-14 NOTE — ED Notes (Signed)
Bed: BO17 Expected date:  Expected time:  Means of arrival:  Comments: EMS 80yo N/V/D x 3 days

## 2013-10-14 NOTE — ED Notes (Signed)
Pt out of room for CT 

## 2013-10-14 NOTE — ED Notes (Signed)
Pt brought in by EMS, complain of n/v/d X 3days. Per EMS report, family called because they noticed pt was getting confused. Pt is oriented x3 at this time, states she was seen 2 weeks ago for same symptoms.

## 2013-10-14 NOTE — ED Notes (Addendum)
EKG given to EDP, Docherty,MD. For review.

## 2013-10-14 NOTE — ED Provider Notes (Signed)
CSN: [id number]     Arrival date & time 10/14/13  2127 History   First MD Initiated Contact with Patient 10/14/13 2128     Chief Complaint  Patient presents with  . Nausea  . Emesis  . Diarrhea   (Consider location/radiation/quality/duration/timing/severity/associated sxs/prior Treatment) HPI  Level 5- Caveat - AMS  PCP: [name] With a history of COPD, bronchitis and 2014, breast cancer status post radiation,  Patient brought to the ED by EMS after having 3  Days of nausea, vomiting and diarrhea. The family called EMS because they feel as though she is confused. She an episode of hypoxia  and was admitted two weeks ago. While in the ED she was started on Doxycycline. She was dsicharge from the hospital on 10/01/2013 in stable condition.   The patient denies abdominal pains but hasan episode of diarrhea while I am interviewing patient. She is mildly confused. When  I ask her questions she says, "im okay but Ive made a mess". Pt having bowel urgency and vomiting.  Past Medical History  Diagnosis Date  . COPD (chronic obstructive pulmonary disease)   . Arthritis   . Cancer   . Breast cancer     right  . Hypertension   . AKI (acute kidney injury) 10/01/2013  . Shortness of breath   . Diabetes mellitus without complication     TYPE 2   Past Surgical History  Procedure Laterality Date  . Laparoscopic hysterectomy    . Appendectomy    . Laparoscopic cholecystectomy    . Breast surgery Right lumpectomy   History reviewed. No pertinent family history. History  Substance Use Topics  . Smoking status: Former Smoker    Quit date: 02/20/1980  . Smokeless tobacco: Never Used  . Alcohol Use: No   OB History   Grav Para Term Preterm Abortions TAB SAB Ect Mult Living                 Review of Systems Level 5- Caveat - AMS   Allergies  Tetanus toxoids; Latex; and Penicillins  Home Medications   Current Outpatient Rx  Name  Route  Sig  Dispense  Refill  . albuterol  (PROVENTIL HFA;VENTOLIN HFA) 108 (90 BASE) MCG/ACT inhaler   Inhalation   Inhale 2 puffs into the lungs every 6 (six) hours as needed for wheezing or shortness of breath.   1 Inhaler   0   . albuterol (PROVENTIL) (2.5 MG/3ML) 0.083% nebulizer solution   Nebulization   Take 2.5 mg by nebulization every 6 (six) hours.         Marland Kitchen aspirin EC 81 MG tablet   Oral   Take 81 mg by mouth every evening.         . benazepril (LOTENSIN) 40 MG tablet   Oral   Take 40 mg by mouth daily.         . budesonide (PULMICORT) 0.5 MG/2ML nebulizer solution   Nebulization   Take 0.5 mg by nebulization 2 (two) times daily.         Marland Kitchen CALCIUM PO   Oral   Take 1 tablet by mouth 2 (two) times daily.         . Cholecalciferol (VITAMIN D PO)   Oral   Take 1 capsule by mouth every evening.         . Coenzyme Q10 (COQ10 PO)   Oral   Take 10 mLs by mouth daily.         Marland Kitchen  furosemide (LASIX) 40 MG tablet   Oral   Take 40 mg by mouth daily.          Marland Kitchen Ketotifen Fumarate (ALAWAY OP)   Ophthalmic   Apply 1 drop to eye 2 (two) times daily as needed. For itchy eyes         . metoprolol succinate (TOPROL-XL) 50 MG 24 hr tablet   Oral   Take 25 mg by mouth daily. Take with or immediately following a meal.         . Multiple Vitamin (MULTIVITAMIN WITH MINERALS) TABS   Oral   Take 1 tablet by mouth every evening.         . Multiple Vitamins-Minerals (PRESERVISION AREDS PO)   Oral   Take 1 tablet by mouth 2 (two) times daily.         . Omega-3 Fatty Acids (FISH OIL PO)   Oral   Take 1 capsule by mouth every evening.         . potassium chloride (K-DUR,KLOR-CON) 10 MEQ tablet   Oral   Take 10 mEq by mouth daily.         . predniSONE (DELTASONE) 10 MG tablet      Take 4 tabs daily x3 days, then 3 tabs daily x3 days, then 2 tabs daily x3 days, then 1 tab daily x3 days, then stop   30 tablet   0   . sertraline (ZOLOFT) 50 MG tablet   Oral   Take 50 mg by mouth daily.           Marland Kitchen tiotropium (SPIRIVA HANDIHALER) 18 MCG inhalation capsule   Inhalation   Place 1 capsule (18 mcg total) into inhaler and inhale daily.   30 capsule   0   . verapamil (CALAN) 120 MG tablet   Oral   Take 60 mg by mouth 2 (two) times daily.          Marland Kitchen doxycycline (VIBRA-TABS) 100 MG tablet   Oral   Take 1 tablet (100 mg total) by mouth every 12 (twelve) hours.   9 tablet   0    BP 209/73  Pulse 83  Temp(Src) 97.5 F (36.4 C) (Oral)  Resp 18  SpO2 98% Physical Exam  Nursing note and vitals reviewed. Constitutional: She appears well-developed and well-nourished. No distress.  HENT:  Head: Normocephalic and atraumatic.  Eyes: Pupils are equal, round, and reactive to light.  Neck: Normal range of motion. Neck supple.  Cardiovascular: Normal rate and regular rhythm.   Pulmonary/Chest: Effort normal.  Abdominal: Soft. She exhibits distension. Bowel sounds are increased. There is generalized tenderness. There is guarding. There is no tenderness at McBurney's point and negative Murphy's sign.  Brown watery diarrhea, uncontrollable Exam is limited by large omentum.   Neurological: She is alert.  Skin: Skin is warm and dry.    ED Course  Procedures (including critical care time) Labs Review Labs Reviewed  COMPREHENSIVE METABOLIC PANEL - Abnormal; Notable for the following:    Sodium 136 (*)    Glucose, Bld 140 (*)    BUN 24 (*)    Calcium 7.9 (*)    Total Protein 5.7 (*)    Albumin 2.5 (*)    ALT 40 (*)    GFR calc non Af Amer 57 (*)    GFR calc Af Amer 67 (*)    All other components within normal limits  CBC WITH DIFFERENTIAL - Abnormal; Notable for the following:  WBC 15.3 (*)    RDW 16.2 (*)    Neutrophils Relative % 84 (*)    Neutro Abs 12.9 (*)    Lymphocytes Relative 8 (*)    Monocytes Absolute 1.1 (*)    All other components within normal limits  STOOL CULTURE  CLOSTRIDIUM DIFFICILE BY PCR  LIPASE, BLOOD  URINALYSIS, ROUTINE W REFLEX  MICROSCOPIC  OCCULT BLOOD X 1 CARD TO LAB, STOOL   Imaging Review Ct Abdomen Pelvis W Contrast  10/15/2013   CLINICAL DATA:  Nausea, vomiting, diarrhea, weakness.  EXAM: CT ABDOMEN AND PELVIS WITH CONTRAST  TECHNIQUE: Multidetector CT imaging of the abdomen and pelvis was performed using the standard protocol following bolus administration of intravenous contrast.  CONTRAST:  186mL OMNIPAQUE IOHEXOL 300 MG/ML  SOLN  COMPARISON:  09/24/2009  FINDINGS: Heart size upper normal to mildly enlarged. Mild peripheral reticular opacities/scarring. Coronary artery and/or aortic valvular calcifications. Tiny hiatal hernia.  Multiple hepatic cystic foci, some of which have increased in size. As index, central dominant cyst measuring 4.2 x 5.1 cm, 4.2 x 4.6 cm on the prior. No solid or complex enhancing components. No appreciable abnormality of the pancreas, spleen. Bilateral adrenal nodules are similar to prior and favored to reflect adenoma on the prior. Indeterminate postcontrast on today's exam.  Nonobstructing lower pole left renal stone. Bilateral renal cysts including parapelvic cysts on the left. No hydroureteronephrosis.  Extensive colonic diverticulosis. There is an area of thickening along the sigmoid colon on image 67 of series 2, nonspecific. The anterior abdominal wall and portions of the anterior peritoneum are excluded from the field of view. No overt colitis. Appendix not identified. No right lower quadrant inflammation. Small bowel loops are of normal course and caliber. No free intraperitoneal air or fluid. No lymphadenopathy.  Advanced atherosclerotic disease of the aorta and branch vessels without aneurysmal dilatation. Small ventral hernia containing fat and vessels.  Thin walled bladder.  Absent uterus.  No adnexal mass.  Multilevel degenerative changes. Osteopenia. Postoperative changes of the lower lumbar spine. Anterolisthesis of L4 on L5 is similar to prior.  IMPRESSION: No acute abdominopelvic  process identified by CT.  Focal thickening along a segment of sigmoid colon may be accentuated by nondistention. Consider colonoscopy to exclude a mass.  Hepatic and renal cysts as above.   Electronically Signed   By: Carlos Levering M.D.   On: 10/15/2013 00:11    EKG Interpretation    Date/Time:  Thursday October 14 2013 22:27:36 EST Ventricular Rate:  83 PR Interval:  161 QRS Duration: 82 QT Interval:  367 QTC Calculation: 431 R Axis:   17 Text Interpretation:  Sinus rhythm RSR' in V1 or V2, probably normal variant No significant change since last tracing Confirmed by [place] [phone]) on 10/14/2013 10:32:28 PM            MDM   1. Weakness   2. Nausea vomiting and diarrhea    Patient work-up is essentially unremarkable. CT scan shows no acute changes. Her blood work is actually better than her baseline Stool cultures and c.dif cultures are pending.  Patient lives alone and says she feels weak, pt and family members would feel more comfortable if she stayed in the hospital.  Admitted to [facility], Team 8, inpatient, medsurg    Linus Mako, PA-C 10/15/13 [phone]

## 2013-10-15 ENCOUNTER — Encounter (HOSPITAL_COMMUNITY): Payer: Self-pay | Admitting: Internal Medicine

## 2013-10-15 DIAGNOSIS — E119 Type 2 diabetes mellitus without complications: Secondary | ICD-10-CM | POA: Diagnosis present

## 2013-10-15 DIAGNOSIS — E860 Dehydration: Secondary | ICD-10-CM | POA: Diagnosis present

## 2013-10-15 DIAGNOSIS — R197 Diarrhea, unspecified: Secondary | ICD-10-CM

## 2013-10-15 DIAGNOSIS — R5381 Other malaise: Secondary | ICD-10-CM

## 2013-10-15 DIAGNOSIS — R531 Weakness: Secondary | ICD-10-CM | POA: Diagnosis present

## 2013-10-15 DIAGNOSIS — J449 Chronic obstructive pulmonary disease, unspecified: Secondary | ICD-10-CM

## 2013-10-15 DIAGNOSIS — R112 Nausea with vomiting, unspecified: Secondary | ICD-10-CM

## 2013-10-15 DIAGNOSIS — R5383 Other fatigue: Secondary | ICD-10-CM

## 2013-10-15 DIAGNOSIS — I10 Essential (primary) hypertension: Secondary | ICD-10-CM

## 2013-10-15 LAB — COMPREHENSIVE METABOLIC PANEL
ALBUMIN: 2.5 g/dL — AB (ref 3.5–5.2)
ALT: 37 U/L — ABNORMAL HIGH (ref 0–35)
AST: 19 U/L (ref 0–37)
Alkaline Phosphatase: 70 U/L (ref 39–117)
BUN: 20 mg/dL (ref 6–23)
CO2: 21 mEq/L (ref 19–32)
CREATININE: 0.9 mg/dL (ref 0.50–1.10)
Calcium: 7.6 mg/dL — ABNORMAL LOW (ref 8.4–10.5)
Chloride: 103 mEq/L (ref 96–112)
GFR calc Af Amer: 65 mL/min — ABNORMAL LOW (ref >90)
GFR calc non Af Amer: 56 mL/min — ABNORMAL LOW (ref >90)
Glucose, Bld: 110 mg/dL — ABNORMAL HIGH (ref 70–99)
Potassium: 3.7 mEq/L (ref 3.7–5.3)
Sodium: 136 mEq/L — ABNORMAL LOW (ref 137–147)
TOTAL PROTEIN: 5.3 g/dL — AB (ref 6.0–8.3)
Total Bilirubin: 0.3 mg/dL (ref 0.3–1.2)

## 2013-10-15 LAB — CBC WITH DIFFERENTIAL/PLATELET
Basophils Absolute: 0 10*3/uL (ref 0.0–0.1)
Basophils Relative: 0 % (ref 0–1)
EOS PCT: 1 % (ref 0–5)
Eosinophils Absolute: 0.1 10*3/uL (ref 0.0–0.7)
HEMATOCRIT: 37.7 % (ref 36.0–46.0)
HEMOGLOBIN: 12.5 g/dL (ref 12.0–15.0)
LYMPHS ABS: 2 10*3/uL (ref 0.7–4.0)
LYMPHS PCT: 13 % (ref 12–46)
MCH: 31 pg (ref 26.0–34.0)
MCHC: 33.2 g/dL (ref 30.0–36.0)
MCV: 93.5 fL (ref 78.0–100.0)
MONO ABS: 1.6 10*3/uL — AB (ref 0.1–1.0)
MONOS PCT: 10 % (ref 3–12)
NEUTROS ABS: 11.7 10*3/uL — AB (ref 1.7–7.7)
Neutrophils Relative %: 76 % (ref 43–77)
Platelets: 175 10*3/uL (ref 150–400)
RBC: 4.03 MIL/uL (ref 3.87–5.11)
RDW: 16.3 % — ABNORMAL HIGH (ref 11.5–15.5)
WBC: 15.3 10*3/uL — AB (ref 4.0–10.5)

## 2013-10-15 LAB — LACTIC ACID, PLASMA: LACTIC ACID, VENOUS: 1.5 mmol/L (ref 0.5–2.2)

## 2013-10-15 LAB — URINALYSIS, ROUTINE W REFLEX MICROSCOPIC
Bilirubin Urine: NEGATIVE
Glucose, UA: NEGATIVE mg/dL
HGB URINE DIPSTICK: NEGATIVE
Ketones, ur: NEGATIVE mg/dL
LEUKOCYTES UA: NEGATIVE
Nitrite: NEGATIVE
PH: 6.5 (ref 5.0–8.0)
PROTEIN: NEGATIVE mg/dL
SPECIFIC GRAVITY, URINE: 1.028 (ref 1.005–1.030)
Urobilinogen, UA: 1 mg/dL (ref 0.0–1.0)

## 2013-10-15 LAB — TSH: TSH: 1.506 u[IU]/mL (ref 0.350–4.500)

## 2013-10-15 LAB — GLUCOSE, CAPILLARY
Glucose-Capillary: 109 mg/dL — ABNORMAL HIGH (ref 70–99)
Glucose-Capillary: 114 mg/dL — ABNORMAL HIGH (ref 70–99)
Glucose-Capillary: 101 mg/dL — ABNORMAL HIGH (ref 70–99)
Glucose-Capillary: 87 mg/dL (ref 70–99)

## 2013-10-15 LAB — TROPONIN I: Troponin I: <0.3 ng/mL (ref <0.30)

## 2013-10-15 LAB — OCCULT BLOOD X 1 CARD TO LAB, STOOL: Fecal Occult Bld: NEGATIVE

## 2013-10-15 MED ORDER — ASPIRIN EC 81 MG PO TBEC
81.0000 mg | DELAYED_RELEASE_TABLET | Freq: Every evening | ORAL | Status: DC
  Administered 2013-10-15 – 2013-10-16 (×2): 81 mg via ORAL
  Filled 2013-10-15 (×3): qty 1

## 2013-10-15 MED ORDER — METOPROLOL SUCCINATE ER 25 MG PO TB24
25.0000 mg | ORAL_TABLET | Freq: Every day | ORAL | Status: DC
  Administered 2013-10-15 – 2013-10-17 (×3): 25 mg via ORAL
  Filled 2013-10-15 (×3): qty 1

## 2013-10-15 MED ORDER — ONDANSETRON HCL 4 MG/2ML IJ SOLN
4.0000 mg | Freq: Four times a day (QID) | INTRAMUSCULAR | Status: DC | PRN
  Administered 2013-10-15: 4 mg via INTRAVENOUS
  Filled 2013-10-15: qty 2

## 2013-10-15 MED ORDER — ENOXAPARIN SODIUM 40 MG/0.4ML ~~LOC~~ SOLN
40.0000 mg | SUBCUTANEOUS | Status: DC
  Administered 2013-10-15 – 2013-10-17 (×3): 40 mg via SUBCUTANEOUS
  Filled 2013-10-15 (×3): qty 0.4

## 2013-10-15 MED ORDER — SODIUM CHLORIDE 0.9 % IV SOLN
INTRAVENOUS | Status: AC
  Administered 2013-10-15: 22:00:00 via INTRAVENOUS

## 2013-10-15 MED ORDER — OMEGA-3-ACID ETHYL ESTERS 1 G PO CAPS
1.0000 g | ORAL_CAPSULE | Freq: Every day | ORAL | Status: DC
  Administered 2013-10-15 – 2013-10-17 (×3): 1 g via ORAL
  Filled 2013-10-15 (×3): qty 1

## 2013-10-15 MED ORDER — FAMOTIDINE 20 MG PO TABS
20.0000 mg | ORAL_TABLET | Freq: Two times a day (BID) | ORAL | Status: DC | PRN
  Administered 2013-10-15 – 2013-10-16 (×2): 20 mg via ORAL
  Filled 2013-10-15 (×5): qty 1

## 2013-10-15 MED ORDER — ADULT MULTIVITAMIN W/MINERALS CH
1.0000 | ORAL_TABLET | Freq: Every evening | ORAL | Status: DC
  Administered 2013-10-15 – 2013-10-16 (×2): 1 via ORAL
  Filled 2013-10-15 (×3): qty 1

## 2013-10-15 MED ORDER — TIOTROPIUM BROMIDE MONOHYDRATE 18 MCG IN CAPS
18.0000 ug | ORAL_CAPSULE | Freq: Every day | RESPIRATORY_TRACT | Status: DC
  Administered 2013-10-15 – 2013-10-17 (×3): 18 ug via RESPIRATORY_TRACT
  Filled 2013-10-15: qty 5

## 2013-10-15 MED ORDER — VERAPAMIL HCL 120 MG PO TABS
60.0000 mg | ORAL_TABLET | Freq: Two times a day (BID) | ORAL | Status: DC
  Administered 2013-10-15 – 2013-10-17 (×6): 60 mg via ORAL
  Filled 2013-10-15 (×7): qty 0.5

## 2013-10-15 MED ORDER — SERTRALINE HCL 50 MG PO TABS
50.0000 mg | ORAL_TABLET | Freq: Every day | ORAL | Status: DC
Start: 2013-10-15 — End: 2013-10-17
  Administered 2013-10-15 – 2013-10-17 (×3): 50 mg via ORAL
  Filled 2013-10-15 (×3): qty 1

## 2013-10-15 MED ORDER — INSULIN ASPART 100 UNIT/ML ~~LOC~~ SOLN: 0.0000 [IU] | Freq: Three times a day (TID) | SUBCUTANEOUS | Status: DC

## 2013-10-15 MED ORDER — SODIUM CHLORIDE 0.9 % IV SOLN
INTRAVENOUS | Status: AC
  Administered 2013-10-15: 01:00:00 via INTRAVENOUS

## 2013-10-15 MED ORDER — ALBUTEROL SULFATE (2.5 MG/3ML) 0.083% IN NEBU: 3.0000 mL | INHALATION_SOLUTION | Freq: Four times a day (QID) | RESPIRATORY_TRACT | Status: DC | PRN

## 2013-10-15 MED ORDER — DIPHENHYDRAMINE HCL 12.5 MG/5ML PO ELIX
12.5000 mg | ORAL_SOLUTION | Freq: Once | ORAL | Status: AC
  Administered 2013-10-15: 12.5 mg via ORAL
  Filled 2013-10-15: qty 5

## 2013-10-15 MED ORDER — ACETAMINOPHEN 650 MG RE SUPP: 650.0000 mg | Freq: Four times a day (QID) | RECTAL | Status: DC | PRN

## 2013-10-15 MED ORDER — ALBUTEROL SULFATE (2.5 MG/3ML) 0.083% IN NEBU
2.5000 mg | INHALATION_SOLUTION | Freq: Four times a day (QID) | RESPIRATORY_TRACT | Status: DC
  Administered 2013-10-15 – 2013-10-16 (×7): 2.5 mg via RESPIRATORY_TRACT
  Filled 2013-10-15 (×7): qty 3

## 2013-10-15 MED ORDER — HYDRALAZINE HCL 20 MG/ML IJ SOLN: 10.0000 mg | INTRAMUSCULAR | Status: DC | PRN

## 2013-10-15 MED ORDER — BENAZEPRIL HCL 40 MG PO TABS
40.0000 mg | ORAL_TABLET | Freq: Every day | ORAL | Status: DC
  Administered 2013-10-15 – 2013-10-17 (×3): 40 mg via ORAL
  Filled 2013-10-15 (×3): qty 1

## 2013-10-15 MED ORDER — ACETAMINOPHEN 325 MG PO TABS
650.0000 mg | ORAL_TABLET | Freq: Four times a day (QID) | ORAL | Status: DC | PRN
  Administered 2013-10-16: 650 mg via ORAL
  Filled 2013-10-15: qty 2

## 2013-10-15 MED ORDER — BUDESONIDE 0.5 MG/2ML IN SUSP
0.5000 mg | Freq: Two times a day (BID) | RESPIRATORY_TRACT | Status: DC
  Administered 2013-10-15 – 2013-10-17 (×5): 0.5 mg via RESPIRATORY_TRACT
  Filled 2013-10-15 (×10): qty 2

## 2013-10-15 MED ORDER — ONDANSETRON HCL 4 MG PO TABS: 4.0000 mg | ORAL_TABLET | Freq: Four times a day (QID) | ORAL | Status: DC | PRN

## 2013-10-15 NOTE — H&P (Signed)
[facility] History and Physical  Adrienne [name] AYT:[id number] DOB: [birth year] DOA: 10/14/2013  Referring physician: ER physician. PCP: Thressa Sheller, MD   Chief Complaint: Nausea vomiting and diarrhea and weakness.  HPI: Adrienne [name] is a 80 y.o. female with history of COPD he was recently admitted for COPD exacerbation and was discharged on January 19 started experiencing nausea vomiting and diarrhea over the last 2 days. Patient states that she had multiple episodes of diarrhea which was watery nonbloody. She denies any abdominal pain. Patient states that she has been unable to keep anything because of the nausea vomiting. In the ER patient had CT abdomen and pelvis which did not show anything acute except for focal thickening along the sigmoid colon which may be secondary to nondistention and colonoscopy were recommended. Since patient has been feeling weak dehydrated and has been having multiple episodes of diarrhea and vomiting has been admitted for further management. Patient otherwise denies any chest pain or shortness of breath fever chills. Patient was on antibiotics for COPD exacerbation during the recent admission.   Review of Systems: As presented in the history of presenting illness, rest negative.  Past Medical History  Diagnosis Date  . COPD (chronic obstructive pulmonary disease)   . Arthritis   . Cancer   . Breast cancer     right  . Hypertension   . AKI (acute kidney injury) 10/01/2013  . Shortness of breath   . Diabetes mellitus without complication     TYPE 2   Past Surgical History  Procedure Laterality Date  . Laparoscopic hysterectomy    . Appendectomy    . Laparoscopic cholecystectomy    . Breast surgery Right lumpectomy   Social History:  reports that she quit smoking about 33 years ago. She has never used smokeless tobacco. She reports that she does not drink alcohol or use illicit drugs. Where does patient live home. Can patient  participate in ADLs? Yes.  Allergies  Allergen Reactions  . Tetanus Toxoids Swelling  . Latex Rash  . Penicillins Rash    Family History: History reviewed. No pertinent family history.    Prior to Admission medications   Medication Sig Start Date End Date Taking? Authorizing Provider  albuterol (PROVENTIL HFA;VENTOLIN HFA) 108 (90 BASE) MCG/ACT inhaler Inhale 2 puffs into the lungs every 6 (six) hours as needed for wheezing or shortness of breath. 10/04/13  Yes Modena Jansky, MD  albuterol (PROVENTIL) (2.5 MG/3ML) 0.083% nebulizer solution Take 2.5 mg by nebulization every 6 (six) hours.   Yes Historical Provider, MD  aspirin EC 81 MG tablet Take 81 mg by mouth every evening.   Yes Historical Provider, MD  benazepril (LOTENSIN) 40 MG tablet Take 40 mg by mouth daily.   Yes Historical Provider, MD  budesonide (PULMICORT) 0.5 MG/2ML nebulizer solution Take 0.5 mg by nebulization 2 (two) times daily.   Yes Historical Provider, MD  CALCIUM PO Take 1 tablet by mouth 2 (two) times daily.   Yes Historical Provider, MD  Cholecalciferol (VITAMIN D PO) Take 1 capsule by mouth every evening.   Yes Historical Provider, MD  Coenzyme Q10 (COQ10 PO) Take 10 mLs by mouth daily.   Yes Historical Provider, MD  furosemide (LASIX) 40 MG tablet Take 40 mg by mouth daily.    Yes Historical Provider, MD  Ketotifen Fumarate (ALAWAY OP) Apply 1 drop to eye 2 (two) times daily as needed. For itchy eyes   Yes Historical Provider, MD  metoprolol succinate (TOPROL-XL) 50  MG 24 hr tablet Take 25 mg by mouth daily. Take with or immediately following a meal.   Yes Historical Provider, MD  Multiple Vitamin (MULTIVITAMIN WITH MINERALS) TABS Take 1 tablet by mouth every evening.   Yes Historical Provider, MD  Multiple Vitamins-Minerals (PRESERVISION AREDS PO) Take 1 tablet by mouth 2 (two) times daily.   Yes Historical Provider, MD  Omega-3 Fatty Acids (FISH OIL PO) Take 1 capsule by mouth every evening.   Yes Historical  Provider, MD  potassium chloride (K-DUR,KLOR-CON) 10 MEQ tablet Take 10 mEq by mouth daily.   Yes Historical Provider, MD  predniSONE (DELTASONE) 10 MG tablet Take 4 tabs daily x3 days, then 3 tabs daily x3 days, then 2 tabs daily x3 days, then 1 tab daily x3 days, then stop 10/04/13  Yes Modena Jansky, MD  sertraline (ZOLOFT) 50 MG tablet Take 50 mg by mouth daily.  10/07/13  Yes Historical Provider, MD  tiotropium (SPIRIVA HANDIHALER) 18 MCG inhalation capsule Place 1 capsule (18 mcg total) into inhaler and inhale daily. 10/04/13  Yes Modena Jansky, MD  verapamil (CALAN) 120 MG tablet Take 60 mg by mouth 2 (two) times daily.    Yes Historical Provider, MD  doxycycline (VIBRA-TABS) 100 MG tablet Take 1 tablet (100 mg total) by mouth every 12 (twelve) hours. 10/04/13   Modena Jansky, MD    Physical Exam: Filed Vitals:   10/14/13 2333 10/15/13 0057 10/15/13 0125 10/15/13 0303  BP: 209/73 171/84 182/76   Pulse:  86 92   Temp:   98 F (36.7 C)   TempSrc:   Oral   Resp:  14 16   Height:   5\' 4"  (1.626 m)   Weight:   80.922 kg (178 lb 6.4 oz)   SpO2:  99% 96% 97%     General:  Well-developed and nourished.  Eyes: Anicteric no pallor.  ENT: No discharge from the ears eyes nose mouth.  Neck: No mass felt.  Cardiovascular: S1-S2 heard.  Respiratory: No rhonchi or crepitations.  Abdomen: Soft nontender bowel sounds present no guarding rigidity.  Skin: No rash.  Musculoskeletal: No edema.  Psychiatric: Appears normal.  Neurologic: Alert awake oriented to time place and person. Moves all extremities.  Labs on Admission:  Basic Metabolic Panel:  Recent Labs Lab 10/14/13 2140  NA 136*  K 3.9  CL 102  CO2 20  GLUCOSE 140*  BUN 24*  CREATININE 0.88  CALCIUM 7.9*   Liver Function Tests:  Recent Labs Lab 10/14/13 2140  AST 20  ALT 40*  ALKPHOS 70  BILITOT 0.3  PROT 5.7*  ALBUMIN 2.5*    Recent Labs Lab 10/14/13 2140  LIPASE 19   No results found for  this basename: AMMONIA,  in the last 168 hours CBC:  Recent Labs Lab 10/14/13 2140  WBC 15.3*  NEUTROABS 12.9*  HGB 12.3  HCT 37.3  MCV 94.0  PLT 156   Cardiac Enzymes: No results found for this basename: CKTOTAL, CKMB, CKMBINDEX, TROPONINI,  in the last 168 hours  BNP (last 3 results)  Recent Labs  10/01/13 1116  PROBNP 1521.0*   CBG: No results found for this basename: GLUCAP,  in the last 168 hours  Radiological Exams on Admission: Ct Abdomen Pelvis W Contrast  10/15/2013   CLINICAL DATA:  Nausea, vomiting, diarrhea, weakness.  EXAM: CT ABDOMEN AND PELVIS WITH CONTRAST  TECHNIQUE: Multidetector CT imaging of the abdomen and pelvis was performed using the standard protocol following  bolus administration of intravenous contrast.  CONTRAST:  147mL OMNIPAQUE IOHEXOL 300 MG/ML  SOLN  COMPARISON:  09/24/2009  FINDINGS: Heart size upper normal to mildly enlarged. Mild peripheral reticular opacities/scarring. Coronary artery and/or aortic valvular calcifications. Tiny hiatal hernia.  Multiple hepatic cystic foci, some of which have increased in size. As index, central dominant cyst measuring 4.2 x 5.1 cm, 4.2 x 4.6 cm on the prior. No solid or complex enhancing components. No appreciable abnormality of the pancreas, spleen. Bilateral adrenal nodules are similar to prior and favored to reflect adenoma on the prior. Indeterminate postcontrast on today's exam.  Nonobstructing lower pole left renal stone. Bilateral renal cysts including parapelvic cysts on the left. No hydroureteronephrosis.  Extensive colonic diverticulosis. There is an area of thickening along the sigmoid colon on image 67 of series 2, nonspecific. The anterior abdominal wall and portions of the anterior peritoneum are excluded from the field of view. No overt colitis. Appendix not identified. No right lower quadrant inflammation. Small bowel loops are of normal course and caliber. No free intraperitoneal air or fluid. No  lymphadenopathy.  Advanced atherosclerotic disease of the aorta and branch vessels without aneurysmal dilatation. Small ventral hernia containing fat and vessels.  Thin walled bladder.  Absent uterus.  No adnexal mass.  Multilevel degenerative changes. Osteopenia. Postoperative changes of the lower lumbar spine. Anterolisthesis of L4 on L5 is similar to prior.  IMPRESSION: No acute abdominopelvic process identified by CT.  Focal thickening along a segment of sigmoid colon may be accentuated by nondistention. Consider colonoscopy to exclude a mass.  Hepatic and renal cysts as above.   Electronically Signed   By: Carlos Levering M.D.   On: 10/15/2013 00:11     Assessment/Plan Principal Problem:   Nausea vomiting and diarrhea Active Problems:   Hypertension   COPD (chronic obstructive pulmonary disease)   Weakness   Dehydration   DM (diabetes mellitus)   1. Nausea vomiting and diarrhea - at this time patient has been placed on IV fluids. Check stool for C. difficile PCR and stool culture. CT abdomen and pelvis did not show any acute except for focal thickening of sigmoid colon which may be secondary to nondistention and colonoscopy was recommended. Patient is presently afebrile. 2. Dehydration and weakness - continue with hydration. Hold Lasix. Closely follow intake output. 3. Leukocytosis - patient is afebrile abdomen appears benign on exam. Closely follow CBC. 4. Hypertension - at this time in addition patient's home medications. Holding Lasix due to dehydration. I am adding when necessary IV hydralazine for systolic blood pressure more than 160 5. Hyperglycemia - probably secondary to steroids recently used. Recent hemoglobin A1c was 6.1. Closely follow CBGs with 6. COPD - presently not wheezing. Continue inhalers. 7. Polycystic liver disease adrenal nodules concerning for adrenal adenoma - further workup as outpatient. Due to the uncontrolled blood pressure I will order plasma metanephrines  which needs to be followed.  I have reviewed patient's old charts and labs.  Code Status: Full code.  Family Communication: None.  Disposition Plan: Admit to inpatient.    KAKRAKANDY,ARSHAD N. [facility] Pager [phone].  If 7PM-7AM, please contact night-coverage [email or web address] Password [email or web address] 10/15/2013, 3:08 AM

## 2013-10-15 NOTE — Progress Notes (Signed)
[facility] PROGRESS NOTE  Adrienne [name] MWU:[id number] DOB: 1923-09-28 DOA: 10/14/2013 PCP: Thressa Sheller, MD  Assessment/Plan: Nausea vomiting and diarrhea  - Resolved continue IV fluids. Check stool for C. difficile PCR and stool culture.  -CT abdomen and pelvis did not show any acute except for focal thickening of sigmoid colon which may be secondary to nondistention and colonoscopy was recommended. Consult GI in the a.m. -Start patient on clear liquids + banana -  Dehydration and weakness  - continue with hydration. Hold Lasix.  -Strict I and O.  -Daily a.m. Weights  Leukocytosis  - patient is afebrile abdomen appears benign on exam. Negative bands or left shift, continue to monitor closely  Hypertension  - Continue holding patient's home medications.  -PRN IV hydralazine for systolic blood pressure more than 160 -Due to the uncontrolled blood pressure check plasma metanephrines   Hyperglycemia  - probably secondary to steroids -hemoglobin A1c was 6.1. Closely follow CBGs with  COPD  - presently not wheezing. Continue inhalers.  Polycystic liver disease adrenal nodules - concerning for adrenal adenoma,  further workup as outpatient.   Code Status: Full Family Communication:  Disposition Plan: Resolution of N./V./D.   Consultants:    Procedures:   Antibiotics:   HPI/Subjective: Adrienne [name] is a 80 y.o. WF PMHx COPD he was recently admitted for COPD exacerbation and was discharged on January 19 started experiencing nausea vomiting and diarrhea over the last 2 days. Patient states that she had multiple episodes of diarrhea which was watery nonbloody. She denies any abdominal pain. Patient states that she has been unable to keep anything because of the nausea vomiting. In the ER patient had CT abdomen and pelvis which did not show anything acute except for focal thickening along the sigmoid colon which may be secondary to nondistention and  colonoscopy were recommended. Since patient has been feeling weak dehydrated and has been having multiple episodes of diarrhea and vomiting has been admitted for further management. Patient otherwise denies any chest pain or shortness of breath fever chills. Patient was on antibiotics for COPD exacerbation during the recent admission. 1/30 patient is hungry and requests to eat   Objective: Filed Vitals:   10/15/13 0303 10/15/13 0613 10/15/13 1000 10/15/13 1429  BP:  148/79  138/82  Pulse:  122  70  Temp:  97.7 F (36.5 C)  97.8 F (36.6 C)  TempSrc:  Oral  Oral  Resp:  15  16  Height:      Weight:      SpO2: 97% 97% 98% 98%    Intake/Output Summary (Last 24 hours) at 10/15/13 1632 Last data filed at 10/15/13 1141  Gross per 24 hour  Intake    120 ml  Output    500 ml  Net   -380 ml   Filed Weights   10/15/13 0125  Weight: 80.922 kg (178 lb 6.4 oz)    Exam:  General: Well-developed and nourished.  Neck: No mass felt.  Cardiovascular: S1-S2 heard.  Respiratory: No rhonchi or crepitations.  Abdomen: Soft nontender bowel sounds present no guarding rigidity.  Skin: No rash.  Musculoskeletal: No edema.   Data Reviewed: Basic Metabolic Panel:  Recent Labs Lab 10/14/13 2140 10/15/13 0310  NA 136* 136*  K 3.9 3.7  CL 102 103  CO2 20 21  GLUCOSE 140* 110*  BUN 24* 20  CREATININE 0.88 0.90  CALCIUM 7.9* 7.6*   Liver Function Tests:  Recent Labs Lab 10/14/13 2140 10/15/13 0310  AST 20 19  ALT 40* 37*  ALKPHOS 70 70  BILITOT 0.3 0.3  PROT 5.7* 5.3*  ALBUMIN 2.5* 2.5*    Recent Labs Lab 10/14/13 2140  LIPASE 19   No results found for this basename: AMMONIA,  in the last 168 hours CBC:  Recent Labs Lab 10/14/13 2140 10/15/13 0310  WBC 15.3* 15.3*  NEUTROABS 12.9* 11.7*  HGB 12.3 12.5  HCT 37.3 37.7  MCV 94.0 93.5  PLT 156 175   Cardiac Enzymes:  Recent Labs Lab 10/15/13 0310  TROPONINI <0.30   BNP (last 3 results)  Recent Labs   10/01/13 1116  PROBNP 1521.0*   CBG:  Recent Labs Lab 10/15/13 0737 10/15/13 1232  GLUCAP 109* 114*    No results found for this or any previous visit (from the past 240 hour(s)).   Studies: Ct Abdomen Pelvis W Contrast  10/15/2013   CLINICAL DATA:  Nausea, vomiting, diarrhea, weakness.  EXAM: CT ABDOMEN AND PELVIS WITH CONTRAST  TECHNIQUE: Multidetector CT imaging of the abdomen and pelvis was performed using the standard protocol following bolus administration of intravenous contrast.  CONTRAST:  182mL OMNIPAQUE IOHEXOL 300 MG/ML  SOLN  COMPARISON:  09/24/2009  FINDINGS: Heart size upper normal to mildly enlarged. Mild peripheral reticular opacities/scarring. Coronary artery and/or aortic valvular calcifications. Tiny hiatal hernia.  Multiple hepatic cystic foci, some of which have increased in size. As index, central dominant cyst measuring 4.2 x 5.1 cm, 4.2 x 4.6 cm on the prior. No solid or complex enhancing components. No appreciable abnormality of the pancreas, spleen. Bilateral adrenal nodules are similar to prior and favored to reflect adenoma on the prior. Indeterminate postcontrast on today's exam.  Nonobstructing lower pole left renal stone. Bilateral renal cysts including parapelvic cysts on the left. No hydroureteronephrosis.  Extensive colonic diverticulosis. There is an area of thickening along the sigmoid colon on image 67 of series 2, nonspecific. The anterior abdominal wall and portions of the anterior peritoneum are excluded from the field of view. No overt colitis. Appendix not identified. No right lower quadrant inflammation. Small bowel loops are of normal course and caliber. No free intraperitoneal air or fluid. No lymphadenopathy.  Advanced atherosclerotic disease of the aorta and branch vessels without aneurysmal dilatation. Small ventral hernia containing fat and vessels.  Thin walled bladder.  Absent uterus.  No adnexal mass.  Multilevel degenerative changes. Osteopenia.  Postoperative changes of the lower lumbar spine. Anterolisthesis of L4 on L5 is similar to prior.  IMPRESSION: No acute abdominopelvic process identified by CT.  Focal thickening along a segment of sigmoid colon may be accentuated by nondistention. Consider colonoscopy to exclude a mass.  Hepatic and renal cysts as above.   Electronically Signed   By: Carlos Levering M.D.   On: 10/15/2013 00:11    Scheduled Meds: . albuterol  2.5 mg Nebulization Q6H  . aspirin EC  81 mg Oral QPM  . benazepril  40 mg Oral Daily  . budesonide  0.5 mg Nebulization BID  . enoxaparin (LOVENOX) injection  40 mg Subcutaneous Q24H  . insulin aspart  0-9 Units Subcutaneous TID WC  . metoprolol succinate  25 mg Oral Daily  . multivitamin with minerals  1 tablet Oral QPM  . omega-3 acid ethyl esters  1 g Oral Daily  . sertraline  50 mg Oral Daily  . tiotropium  18 mcg Inhalation Daily  . verapamil  60 mg Oral BID   Continuous Infusions: . sodium chloride  Principal Problem:   Nausea vomiting and diarrhea Active Problems:   Hypertension   COPD (chronic obstructive pulmonary disease)   Weakness   Dehydration   DM (diabetes mellitus)    Time spent: 40 minutes    WOODS, CURTIS, J  [facility] Pager [phone]. If 7PM-7AM, please contact night-coverage at [email or web address], password [facility] 10/15/2013, 4:32 PM  LOS: 1 day

## 2013-10-15 NOTE — Progress Notes (Signed)
Pt sats 98% on 1L nasal cannula.  Placed on room air and notified RN Josph Macho.

## 2013-10-15 NOTE — ED Notes (Signed)
C-diff by PCR not yet collected, pt has not had another BM. Clicked off by mistake.

## 2013-10-16 LAB — GLUCOSE, CAPILLARY
GLUCOSE-CAPILLARY: 80 mg/dL (ref 70–99)
GLUCOSE-CAPILLARY: 124 mg/dL — AB (ref 70–99)
Glucose-Capillary: 84 mg/dL (ref 70–99)
Glucose-Capillary: 80 mg/dL (ref 70–99)

## 2013-10-16 LAB — COMPREHENSIVE METABOLIC PANEL
ALBUMIN: 2.1 g/dL — AB (ref 3.5–5.2)
ALK PHOS: 56 U/L (ref 39–117)
ALT: 26 U/L (ref 0–35)
AST: 15 U/L (ref 0–37)
BUN: 10 mg/dL (ref 6–23)
CALCIUM: 7.1 mg/dL — AB (ref 8.4–10.5)
CO2: 22 mEq/L (ref 19–32)
Chloride: 109 mEq/L (ref 96–112)
Creatinine, Ser: 0.88 mg/dL (ref 0.50–1.10)
GFR calc non Af Amer: 57 mL/min — ABNORMAL LOW (ref >90)
GFR, EST AFRICAN AMERICAN: 67 mL/min — AB (ref >90)
Glucose, Bld: 82 mg/dL (ref 70–99)
Potassium: 3.7 mEq/L (ref 3.7–5.3)
Sodium: 139 mEq/L (ref 137–147)
TOTAL PROTEIN: 4.5 g/dL — AB (ref 6.0–8.3)
Total Bilirubin: 0.4 mg/dL (ref 0.3–1.2)

## 2013-10-16 LAB — CBC WITH DIFFERENTIAL/PLATELET
BASOS PCT: 0 % (ref 0–1)
Basophils Absolute: 0 10*3/uL (ref 0.0–0.1)
EOS PCT: 4 % (ref 0–5)
Eosinophils Absolute: 0.5 10*3/uL (ref 0.0–0.7)
HEMATOCRIT: 31.4 % — AB (ref 36.0–46.0)
HEMOGLOBIN: 10.2 g/dL — AB (ref 12.0–15.0)
Lymphocytes Relative: 24 % (ref 12–46)
Lymphs Abs: 2.7 10*3/uL (ref 0.7–4.0)
MCH: 31 pg (ref 26.0–34.0)
MCHC: 32.5 g/dL (ref 30.0–36.0)
MCV: 95.4 fL (ref 78.0–100.0)
MONO ABS: 1.1 10*3/uL — AB (ref 0.1–1.0)
MONOS PCT: 10 % (ref 3–12)
NEUTROS ABS: 7 10*3/uL (ref 1.7–7.7)
Neutrophils Relative %: 63 % (ref 43–77)
Platelets: 150 10*3/uL (ref 150–400)
RBC: 3.29 MIL/uL — ABNORMAL LOW (ref 3.87–5.11)
RDW: 16.6 % — AB (ref 11.5–15.5)
WBC: 11.2 10*3/uL — ABNORMAL HIGH (ref 4.0–10.5)

## 2013-10-16 LAB — MAGNESIUM: Magnesium: 1.9 mg/dL (ref 1.5–2.5)

## 2013-10-16 MED ORDER — PANTOPRAZOLE SODIUM 40 MG PO TBEC
40.0000 mg | DELAYED_RELEASE_TABLET | Freq: Every day | ORAL | Status: DC
  Administered 2013-10-16 – 2013-10-17 (×2): 40 mg via ORAL
  Filled 2013-10-16 (×2): qty 1

## 2013-10-16 NOTE — Progress Notes (Addendum)
[facility] PROGRESS NOTE  Adrienne [name] [id number] DOB: 08/08/1924 DOA: 10/14/2013 PCP: Thressa Sheller, MD  Assessment/Plan: Nausea vomiting and diarrhea  - Resolved continue IV fluids.  - C. difficile PCR and stool culture. Pending -CT abdomen and pelvis did not show any acute except for focal thickening of sigmoid colon which may be secondary to nondistention and colonoscopy was recommended. - Consult  GI on 2/2  -Start patient on full liquid + banana -GI pathogen panel by PCR pending  Dehydration and weakness  - continue with hydration.  -Continue to  Hold Lasix.  -Strict I and O.  -Daily a.m. Weights. 1/31 weight= 81.6 kg  Leukocytosis  - Trending down  -patient is afebrile abdomen appears benign on exam. Negative bands or left shift, continue to monitor closely  Hypertension  -  Continue home meds except for Lasix -PRN IV hydralazine for systolic blood pressure more than 160 -Due to the uncontrolled blood pressure check plasma metanephrines (pending)  Hyperglycemia  - probably secondary to steroids -hemoglobin A1c was 6.1. Closely follow CBGs with  COPD  - presently not wheezing. Continue inhalers.  Polycystic liver disease adrenal nodules - concerning for adrenal adenoma,  further workup as outpatient.   Code Status: Full Family Communication:  Disposition Plan: Resolution of N./V./D.   Consultants:    Procedures: CT abdomen pelvis with contrast 10/14/2013 No acute abdominopelvic process identified by CT.  Focal thickening along a segment of sigmoid colon may be accentuated  by nondistention. Consider colonoscopy to exclude a mass.  Hepatic and renal cysts as above.    Antibiotics:   HPI/Subjective: Adrienne [name] is a 80 y.o. WF PMHx COPD he was recently admitted for COPD exacerbation and was discharged on January 19 started experiencing nausea vomiting and diarrhea over the last 2 days. Patient states that she had multiple  episodes of diarrhea which was watery nonbloody. She denies any abdominal pain. Patient states that she has been unable to keep anything because of the nausea vomiting. In the ER patient had CT abdomen and pelvis which did not show anything acute except for focal thickening along the sigmoid colon which may be secondary to nondistention and colonoscopy were recommended. Since patient has been feeling weak dehydrated and has been having multiple episodes of diarrhea and vomiting has been admitted for further management. Patient otherwise denies any chest pain or shortness of breath fever chills. Patient was on antibiotics for COPD exacerbation during the recent admission. 1/30 patient is hungry and requests to eat 1/31 patient said consumed clear liquids and banana without any adverse symptoms.   Objective: Filed Vitals:   10/15/13 1957 10/15/13 2202 10/16/13 0619 10/16/13 0839  BP:  148/60 169/59   Pulse:  58 52   Temp:  97.7 F (36.5 C) 97.9 F (36.6 C)   TempSrc:  Oral Oral   Resp:  16 16   Height:      Weight:   81.647 kg (180 lb)   SpO2: 96% 94% 97% 91%    Intake/Output Summary (Last 24 hours) at 10/16/13 1415 Last data filed at 10/16/13 0842  Gross per 24 hour  Intake    480 ml  Output   1350 ml  Net   -870 ml   Filed Weights   10/15/13 0125 10/16/13 0619  Weight: 80.922 kg (178 lb 6.4 oz) 81.647 kg (180 lb)    Exam:  General: A./O. x4, NAD, Well-developed and nourished.  Neck: Negative JVD, No mass felt.  Cardiovascular: Regular rhythm  and rate, negative murmurs rubs or gallops, DP/PT pulse +1 bilateral .  Respiratory: Clear to auscultation bilateral, No rhonchi or crepitations.  Abdomen: Soft nontender bowel sounds present no guarding rigidity.  Skin: No rash.  Musculoskeletal: No edema.   Data Reviewed: Basic Metabolic Panel:  Recent Labs Lab 10/14/13 2140 10/15/13 0310 10/16/13 0400  NA 136* 136* 139  K 3.9 3.7 3.7  CL 102 103 109  CO2 20 21 22   GLUCOSE  140* 110* 82  BUN 24* 20 10  CREATININE 0.88 0.90 0.88  CALCIUM 7.9* 7.6* 7.1*  MG  --   --  1.9   Liver Function Tests:  Recent Labs Lab 10/14/13 2140 10/15/13 0310 10/16/13 0400  AST 20 19 15   ALT 40* 37* 26  ALKPHOS 70 70 56  BILITOT 0.3 0.3 0.4  PROT 5.7* 5.3* 4.5*  ALBUMIN 2.5* 2.5* 2.1*    Recent Labs Lab 10/14/13 2140  LIPASE 19   No results found for this basename: AMMONIA,  in the last 168 hours CBC:  Recent Labs Lab 10/14/13 2140 10/15/13 0310 10/16/13 0400  WBC 15.3* 15.3* 11.2*  NEUTROABS 12.9* 11.7* 7.0  HGB 12.3 12.5 10.2*  HCT 37.3 37.7 31.4*  MCV 94.0 93.5 95.4  PLT 156 175 150   Cardiac Enzymes:  Recent Labs Lab 10/15/13 0310  TROPONINI <0.30   BNP (last 3 results)  Recent Labs  10/01/13 1116  PROBNP 1521.0*   CBG:  Recent Labs Lab 10/15/13 1232 10/15/13 1637 10/15/13 2200 10/16/13 0756 10/16/13 1209  GLUCAP 114* 101* 87 84 80    Recent Results (from the past 240 hour(s))  STOOL CULTURE     Status: None   Collection Time    10/14/13 10:56 PM      Result Value Range Status   Specimen Description STOOL   Final   Special Requests Normal   Final   Culture     Final   Value: NO SUSPICIOUS COLONIES, CONTINUING TO HOLD     Performed at [facility]   Report Status PENDING   Incomplete  STOOL CULTURE     Status: None   Collection Time    10/15/13  8:30 AM      Result Value Range Status   Specimen Description STOOL   Final   Special Requests NONE   Final   Culture     Final   Value: Culture reincubated for better growth     Performed at [facility]   Report Status PENDING   Incomplete     Studies: Ct Abdomen Pelvis W Contrast  10/15/2013   CLINICAL DATA:  Nausea, vomiting, diarrhea, weakness.  EXAM: CT ABDOMEN AND PELVIS WITH CONTRAST  TECHNIQUE: Multidetector CT imaging of the abdomen and pelvis was performed using the standard protocol following bolus administration of intravenous contrast.   CONTRAST:  134mL OMNIPAQUE IOHEXOL 300 MG/ML  SOLN  COMPARISON:  09/24/2009  FINDINGS: Heart size upper normal to mildly enlarged. Mild peripheral reticular opacities/scarring. Coronary artery and/or aortic valvular calcifications. Tiny hiatal hernia.  Multiple hepatic cystic foci, some of which have increased in size. As index, central dominant cyst measuring 4.2 x 5.1 cm, 4.2 x 4.6 cm on the prior. No solid or complex enhancing components. No appreciable abnormality of the pancreas, spleen. Bilateral adrenal nodules are similar to prior and favored to reflect adenoma on the prior. Indeterminate postcontrast on today's exam.  Nonobstructing lower pole left renal stone. Bilateral renal cysts including parapelvic cysts  on the left. No hydroureteronephrosis.  Extensive colonic diverticulosis. There is an area of thickening along the sigmoid colon on image 67 of series 2, nonspecific. The anterior abdominal wall and portions of the anterior peritoneum are excluded from the field of view. No overt colitis. Appendix not identified. No right lower quadrant inflammation. Small bowel loops are of normal course and caliber. No free intraperitoneal air or fluid. No lymphadenopathy.  Advanced atherosclerotic disease of the aorta and branch vessels without aneurysmal dilatation. Small ventral hernia containing fat and vessels.  Thin walled bladder.  Absent uterus.  No adnexal mass.  Multilevel degenerative changes. Osteopenia. Postoperative changes of the lower lumbar spine. Anterolisthesis of L4 on L5 is similar to prior.  IMPRESSION: No acute abdominopelvic process identified by CT.  Focal thickening along a segment of sigmoid colon may be accentuated by nondistention. Consider colonoscopy to exclude a mass.  Hepatic and renal cysts as above.   Electronically Signed   By: Carlos Levering M.D.   On: 10/15/2013 00:11    Scheduled Meds: . albuterol  2.5 mg Nebulization Q6H  . aspirin EC  81 mg Oral QPM  . benazepril  40  mg Oral Daily  . budesonide  0.5 mg Nebulization BID  . enoxaparin (LOVENOX) injection  40 mg Subcutaneous Q24H  . insulin aspart  0-9 Units Subcutaneous TID WC  . metoprolol succinate  25 mg Oral Daily  . multivitamin with minerals  1 tablet Oral QPM  . omega-3 acid ethyl esters  1 g Oral Daily  . sertraline  50 mg Oral Daily  . tiotropium  18 mcg Inhalation Daily  . verapamil  60 mg Oral BID   Continuous Infusions:    Principal Problem:   Nausea vomiting and diarrhea Active Problems:   Hypertension   COPD (chronic obstructive pulmonary disease)   Weakness   Dehydration   DM (diabetes mellitus)    Time spent: 40 minutes    WOODS, CURTIS, J  [facility] Pager [phone]. If 7PM-7AM, please contact night-coverage at [email or web address], password [facility] 10/16/2013, 2:15 PM  LOS: 2 days

## 2013-10-17 LAB — COMPREHENSIVE METABOLIC PANEL
ALT: 36 U/L — AB (ref 0–35)
AST: 18 U/L (ref 0–37)
Albumin: 2.2 g/dL — ABNORMAL LOW (ref 3.5–5.2)
Alkaline Phosphatase: 64 U/L (ref 39–117)
BILIRUBIN TOTAL: 0.3 mg/dL (ref 0.3–1.2)
BUN: 5 mg/dL — ABNORMAL LOW (ref 6–23)
CALCIUM: 7.7 mg/dL — AB (ref 8.4–10.5)
CHLORIDE: 105 meq/L (ref 96–112)
CO2: 25 mEq/L (ref 19–32)
CREATININE: 0.85 mg/dL (ref 0.50–1.10)
GFR calc Af Amer: 69 mL/min — ABNORMAL LOW (ref >90)
GFR calc non Af Amer: 60 mL/min — ABNORMAL LOW (ref >90)
Glucose, Bld: 94 mg/dL (ref 70–99)
Potassium: 3.7 mEq/L (ref 3.7–5.3)
SODIUM: 137 meq/L (ref 137–147)
Total Protein: 4.8 g/dL — ABNORMAL LOW (ref 6.0–8.3)

## 2013-10-17 LAB — CBC WITH DIFFERENTIAL/PLATELET
Basophils Absolute: 0 10*3/uL (ref 0.0–0.1)
Basophils Relative: 0 % (ref 0–1)
EOS PCT: 3 % (ref 0–5)
Eosinophils Absolute: 0.3 10*3/uL (ref 0.0–0.7)
HCT: 33.1 % — ABNORMAL LOW (ref 36.0–46.0)
Hemoglobin: 10.6 g/dL — ABNORMAL LOW (ref 12.0–15.0)
LYMPHS ABS: 2.3 10*3/uL (ref 0.7–4.0)
Lymphocytes Relative: 23 % (ref 12–46)
MCH: 30.6 pg (ref 26.0–34.0)
MCHC: 32 g/dL (ref 30.0–36.0)
MCV: 95.7 fL (ref 78.0–100.0)
Monocytes Absolute: 0.8 10*3/uL (ref 0.1–1.0)
Monocytes Relative: 8 % (ref 3–12)
Neutro Abs: 6.7 10*3/uL (ref 1.7–7.7)
Neutrophils Relative %: 66 % (ref 43–77)
Platelets: 141 10*3/uL — ABNORMAL LOW (ref 150–400)
RBC: 3.46 MIL/uL — AB (ref 3.87–5.11)
RDW: 16.5 % — ABNORMAL HIGH (ref 11.5–15.5)
WBC: 10.1 10*3/uL (ref 4.0–10.5)

## 2013-10-17 LAB — GLUCOSE, CAPILLARY
GLUCOSE-CAPILLARY: 105 mg/dL — AB (ref 70–99)
Glucose-Capillary: 102 mg/dL — ABNORMAL HIGH (ref 70–99)

## 2013-10-17 LAB — MAGNESIUM: Magnesium: 1.9 mg/dL (ref 1.5–2.5)

## 2013-10-17 MED ORDER — METOPROLOL SUCCINATE ER 25 MG PO TB24
25.0000 mg | ORAL_TABLET | Freq: Once | ORAL | Status: AC
  Administered 2013-10-17: 25 mg via ORAL
  Filled 2013-10-17: qty 1

## 2013-10-17 MED ORDER — ALBUTEROL SULFATE (2.5 MG/3ML) 0.083% IN NEBU
2.5000 mg | INHALATION_SOLUTION | Freq: Four times a day (QID) | RESPIRATORY_TRACT | Status: DC
  Administered 2013-10-17: 2.5 mg via RESPIRATORY_TRACT
  Filled 2013-10-17 (×2): qty 3

## 2013-10-17 NOTE — ED Provider Notes (Signed)
Medical screening examination/treatment/procedure(s) were conducted as a shared visit with non-physician practitioner(s) and myself.  I personally evaluated the patient during the encounter.  Pt is 80 y.o. female presenting w/ AMS, n/v/d.  SHe is HDS on PE.  CT unremarkable.  Given advanced age, poor resources at home, I feel pt unsafe to be d/c at this time. [facility] will admit.     Neta Ehlers, MD 10/17/13 1500

## 2013-10-17 NOTE — Discharge Summary (Signed)
Physician Discharge Summary  Adrienne [name] AST:[id number] DOB: [birth year] DOA: 10/14/2013  PCP: Thayer Headings, MD  Admit date: 10/14/2013 Discharge date: 10/17/2013  Time spent: 40 minutes   Recommendations for Outpatient Follow-up:   Nausea vomiting and diarrhea  - Resolved continue IV fluids.  - C. difficile PCR and stool culture. Pending  -CT abdomen and pelvis did not show any acute except for focal thickening of sigmoid colon which may be secondary to nondistention and colonoscopy was recommended.  - Will refer patient to GI for outpatient workup.  Patient states had a colonoscopy approximately 3 years ago however unable to locate records in Epic to support patient's statement. Refer [facility]  Dehydration and weakness  - Resolved  - Will restart home dosage Lasix upon discharge -Daily a.m. Weights. 2/1 weight= 81.6 kg   Leukocytosis  - Resolved  -patient is afebrile abdomen appears benign on exam. Negative bands or left shift, continue to monitor closely   Hypertension  -Increased metoprolol XL to 50 mg daily  - Restart all other home medications -Due to the uncontrolled blood pressure check plasma metanephrines (pending). Patient to followup with PCP   Hyperglycemia  - Resolved   COPD  - presently not wheezing. Continue inhalers.   Polycystic liver disease adrenal nodules  - concerning for adrenal adenoma, further workup as outpatient.    Discharge Diagnoses:  Principal Problem:   Nausea vomiting and diarrhea Active Problems:   Hypertension   COPD (chronic obstructive pulmonary disease)   Weakness   Dehydration   DM (diabetes mellitus)   Discharge Condition: Stable  Diet recommendation: ADA  Filed Weights   10/15/13 0125 10/16/13 0619 10/17/13 0625  Weight: 80.922 kg (178 lb 6.4 oz) 81.647 kg (180 lb) 81.602 kg (179 lb 14.4 oz)    History of present illness:  Adrienne [name] is a 80 y.o. WF PMHx COPD he was recently admitted for COPD  exacerbation and was discharged on January 19 started experiencing nausea vomiting and diarrhea over the last 2 days. Patient states that she had multiple episodes of diarrhea which was watery nonbloody. She denies any abdominal pain. Patient states that she has been unable to keep anything because of the nausea vomiting. In the ER patient had CT abdomen and pelvis which did not show anything acute except for focal thickening along the sigmoid colon which may be secondary to nondistention and colonoscopy were recommended. Since patient has been feeling weak dehydrated and has been having multiple episodes of diarrhea and vomiting has been admitted for further management. Patient otherwise denies any chest pain or shortness of breath fever chills. Patient was on antibiotics for COPD exacerbation during the recent admission. 1/30 patient is hungry and requests to eat 1/31 patient said consumed clear liquids and banana without any adverse symptoms. 2/1 patient states has been eating regular foods without any adverse signs or symptoms. States ready for discharge. Patient promised that she would followup with any GI position we refer her to as well as with her PCP for outpatient workup of adrenal nodule and polycystic liver.  Consultants:    Procedures:  CT abdomen pelvis with contrast 10/14/2013  No acute abdominopelvic process identified by CT.  Focal thickening along a segment of sigmoid colon may be accentuated  by nondistention. Consider colonoscopy to exclude a mass.  Hepatic and renal cysts as above.    Antibiotics:     Discharge Exam: Filed Vitals:   10/16/13 2032 10/16/13 2124 10/17/13 0625 10/17/13 0849  BP:  189/51 174/67   Pulse:  73 84   Temp:  97.9 F (36.6 C) 98.4 F (36.9 C)   TempSrc:  Oral Oral   Resp:  18 18   Height:      Weight:   81.602 kg (179 lb 14.4 oz)   SpO2: 94% 94% 96% 97%   General: A./O. x4, NAD, Well-developed and nourished.  Neck: Negative JVD, No mass  felt.  Cardiovascular: Regular rhythm and rate, negative murmurs rubs or gallops, DP/PT pulse +1 bilateral .  Respiratory: Clear to auscultation bilateral, No rhonchi or crepitations.  Abdomen: Soft nontender bowel sounds present no guarding rigidity.  Musculoskeletal: No edema.  Discharge Instructions   Future Appointments Provider Department Dept Phone   11/04/2013 2:45 PM Deneise Lever, MD [facility] [phone]       Medication List    ASK your doctor about these medications       ALAWAY OP  Apply 1 drop to eye 2 (two) times daily as needed. For itchy eyes     albuterol (2.5 MG/3ML) 0.083% nebulizer solution  Commonly known as:  PROVENTIL  Take 2.5 mg by nebulization every 6 (six) hours.     albuterol 108 (90 BASE) MCG/ACT inhaler  Commonly known as:  PROVENTIL HFA;VENTOLIN HFA  Inhale 2 puffs into the lungs every 6 (six) hours as needed for wheezing or shortness of breath.     aspirin EC 81 MG tablet  Take 81 mg by mouth every evening.     benazepril 40 MG tablet  Commonly known as:  LOTENSIN  Take 40 mg by mouth daily.     budesonide 0.5 MG/2ML nebulizer solution  Commonly known as:  PULMICORT  Take 0.5 mg by nebulization 2 (two) times daily.     CALCIUM PO  Take 1 tablet by mouth 2 (two) times daily.     COQ10 PO  Take 10 mLs by mouth daily.     doxycycline 100 MG tablet  Commonly known as:  VIBRA-TABS  Take 1 tablet (100 mg total) by mouth every 12 (twelve) hours.     FISH OIL PO  Take 1 capsule by mouth every evening.     furosemide 40 MG tablet  Commonly known as:  LASIX  Take 40 mg by mouth daily.     metoprolol succinate 50 MG 24 hr tablet  Commonly known as:  TOPROL-XL  Take 25 mg by mouth daily. Take with or immediately following a meal.     multivitamin with minerals Tabs tablet  Take 1 tablet by mouth every evening.     potassium chloride 10 MEQ tablet  Commonly known as:  K-DUR,KLOR-CON  Take 10 mEq by mouth daily.      predniSONE 10 MG tablet  Commonly known as:  DELTASONE  Take 4 tabs daily x3 days, then 3 tabs daily x3 days, then 2 tabs daily x3 days, then 1 tab daily x3 days, then stop     PRESERVISION AREDS PO  Take 1 tablet by mouth 2 (two) times daily.     sertraline 50 MG tablet  Commonly known as:  ZOLOFT  Take 50 mg by mouth daily.     tiotropium 18 MCG inhalation capsule  Commonly known as:  SPIRIVA HANDIHALER  Place 1 capsule (18 mcg total) into inhaler and inhale daily.     verapamil 120 MG tablet  Commonly known as:  CALAN  Take 60 mg by mouth 2 (two) times daily.  VITAMIN D PO  Take 1 capsule by mouth every evening.       Allergies  Allergen Reactions  . Tetanus Toxoids Swelling  . Latex Rash  . Penicillins Rash      The results of significant diagnostics from this hospitalization (including imaging, microbiology, ancillary and laboratory) are listed below for reference.    Significant Diagnostic Studies: Dg Chest 2 View  10/01/2013   CLINICAL DATA:  80 year old female with shortness of Breath increasing. Initial encounter.  EXAM: CHEST  2 VIEW  COMPARISON:  09/27/2013 and earlier.  FINDINGS: Stable large lung volumes. Stable cardiomegaly and mediastinal contours. Visualized tracheal air column is within normal limits. No pneumothorax. No pulmonary edema, pleural effusion or consolidation. Chronic increased interstitial markings are stable. Stable right breast surgical clip. No acute osseous abnormality identified.  IMPRESSION: No acute cardiopulmonary abnormality.   Electronically Signed   By: [name]   On: 10/01/2013 12:13   Ct Angio Chest Pe W/cm &/or Wo Cm  10/01/2013   CLINICAL DATA:  80 year old female with shortness of breath. Hypoxia. Initial encounter. History of breast cancer.  EXAM: CT ANGIOGRAPHY CHEST WITH CONTRAST  TECHNIQUE: Multidetector CT imaging of the chest was performed using the standard protocol during bolus administration of intravenous  contrast. Multiplanar CT image reconstructions including MIPs were obtained to evaluate the vascular anatomy.  CONTRAST:  31mL OMNIPAQUE IOHEXOL 350 MG/ML SOLN  COMPARISON:  Chest radiographs from the same day and earlier. CT Abdomen and Pelvis 09/24/2009.  FINDINGS: Good contrast bolus timing in the pulmonary arterial tree.  No focal filling defect identified in the pulmonary arterial tree to suggest the presence of acute pulmonary embolism.  Cardiomegaly, progressed. No pericardial effusion. No pleural effusion. Coronary artery calcified atherosclerosis. Aortic calcified atherosclerosis. Tortuous great vessels. No mediastinal lymphadenopathy. No axillary lymphadenopathy. No internal mammary lymphadenopathy.  Major airways are patent. Dependent pulmonary atelectasis. Additional bilateral ground-glass opacity, superimposed on upper lobe centrilobular emphysema. Mild smooth septal thickening. No consolidation.  12 mm thyroid nodule at the isthmus. No follow-up necessary ; this This follows ACR consensus guidelines: Managing Incidental Thyroid Nodules Detected on Imaging: White Paper of the ACR Incidental Thyroid Findings Committee. J AM Coll Radiol, Published On-line: July 17, 2013.  Chronic polycystic liver disease appears stable since 2011. The entire liver is not visible. The gallbladder is surgically absent as before. Chronic right adrenal low-density lesion (benign adenoma) is stable. Left adrenal gland thickening is stable compatible with hyperplasia. At the left T10 lateral paraspinal soft tissue near the costovertebral junction there is a stable small indistinct soft tissue nodule, favor venous varicosity. Other visualized upper abdominal viscera are stable. Calcified atherosclerosis of the abdominal aorta.  No suspicious osseous lesion identified. There is a mild T7 compression fracture, appears to be chronic with mild endplate sclerosis. No acute osseous abnormality identified.  Review of the MIP images  confirms the above findings.  IMPRESSION: 1.  No evidence of acute pulmonary embolus. 2. Cardiomegaly, increased since 2011. Ground-glass opacity in the lungs suggestive of pulmonary edema superimposed on emphysema. 3. Stable visualized upper abdominal viscera, including polycystic liver disease and right adrenal adenoma. 4. Atherosclerosis. 5. Chronic appearing mild T7 compression fracture.   Electronically Signed   By: [name]   On: 10/01/2013 14:48   Ct Abdomen Pelvis W Contrast  10/15/2013   CLINICAL DATA:  Nausea, vomiting, diarrhea, weakness.  EXAM: CT ABDOMEN AND PELVIS WITH CONTRAST  TECHNIQUE: Multidetector CT imaging of the abdomen and pelvis  was performed using the standard protocol following bolus administration of intravenous contrast.  CONTRAST:  15mL OMNIPAQUE IOHEXOL 300 MG/ML  SOLN  COMPARISON:  09/24/2009  FINDINGS: Heart size upper normal to mildly enlarged. Mild peripheral reticular opacities/scarring. Coronary artery and/or aortic valvular calcifications. Tiny hiatal hernia.  Multiple hepatic cystic foci, some of which have increased in size. As index, central dominant cyst measuring 4.2 x 5.1 cm, 4.2 x 4.6 cm on the prior. No solid or complex enhancing components. No appreciable abnormality of the pancreas, spleen. Bilateral adrenal nodules are similar to prior and favored to reflect adenoma on the prior. Indeterminate postcontrast on today's exam.  Nonobstructing lower pole left renal stone. Bilateral renal cysts including parapelvic cysts on the left. No hydroureteronephrosis.  Extensive colonic diverticulosis. There is an area of thickening along the sigmoid colon on image 67 of series 2, nonspecific. The anterior abdominal wall and portions of the anterior peritoneum are excluded from the field of view. No overt colitis. Appendix not identified. No right lower quadrant inflammation. Small bowel loops are of normal course and caliber. No free intraperitoneal air or fluid. No  lymphadenopathy.  Advanced atherosclerotic disease of the aorta and branch vessels without aneurysmal dilatation. Small ventral hernia containing fat and vessels.  Thin walled bladder.  Absent uterus.  No adnexal mass.  Multilevel degenerative changes. Osteopenia. Postoperative changes of the lower lumbar spine. Anterolisthesis of L4 on L5 is similar to prior.  IMPRESSION: No acute abdominopelvic process identified by CT.  Focal thickening along a segment of sigmoid colon may be accentuated by nondistention. Consider colonoscopy to exclude a mass.  Hepatic and renal cysts as above.   Electronically Signed   By: Carlos Levering M.D.   On: 10/15/2013 00:11   Dg Chest Port 1 View  09/27/2013   CLINICAL DATA:  Sudden onset chest pain.  EXAM: PORTABLE CHEST - 1 VIEW  COMPARISON:  09/20/2013.  FINDINGS: Prominent markings at the bases is a chronic finding. No definite consolidation or edema. The right heart border is less well defined, but this is likely related to rightward rotation. No effusion or pneumothorax. Stable, normal heart size.  IMPRESSION: Stable exam.  No evidence of acute cardiopulmonary disease.   Electronically Signed   By: Jorje Guild M.D.   On: 09/27/2013 03:58    Microbiology: Recent Results (from the past 240 hour(s))  STOOL CULTURE     Status: None   Collection Time    10/14/13 10:56 PM      Result Value Range Status   Specimen Description STOOL   Final   Special Requests Normal   Final   Culture     Final   Value: NO SUSPICIOUS COLONIES, CONTINUING TO HOLD     Performed at [facility]   Report Status PENDING   Incomplete  STOOL CULTURE     Status: None   Collection Time    10/15/13  8:30 AM      Result Value Range Status   Specimen Description STOOL   Final   Special Requests NONE   Final   Culture     Final   Value: Culture reincubated for better growth     Performed at [facility]   Report Status PENDING   Incomplete     Labs: Basic Metabolic  Panel:  Recent Labs Lab 10/14/13 2140 10/15/13 0310 10/16/13 0400 10/17/13 0404  NA 136* 136* 139 137  K 3.9 3.7 3.7 3.7  CL 102 103 109  105  CO2 20 21 22 25   GLUCOSE 140* 110* 82 94  BUN 24* 20 10 5*  CREATININE 0.88 0.90 0.88 0.85  CALCIUM 7.9* 7.6* 7.1* 7.7*  MG  --   --  1.9 1.9   Liver Function Tests:  Recent Labs Lab 10/14/13 2140 10/15/13 0310 10/16/13 0400 10/17/13 0404  AST 20 19 15 18   ALT 40* 37* 26 36*  ALKPHOS 70 70 56 64  BILITOT 0.3 0.3 0.4 0.3  PROT 5.7* 5.3* 4.5* 4.8*  ALBUMIN 2.5* 2.5* 2.1* 2.2*    Recent Labs Lab 10/14/13 2140  LIPASE 19   No results found for this basename: AMMONIA,  in the last 168 hours CBC:  Recent Labs Lab 10/14/13 2140 10/15/13 0310 10/16/13 0400 10/17/13 0404  WBC 15.3* 15.3* 11.2* 10.1  NEUTROABS 12.9* 11.7* 7.0 6.7  HGB 12.3 12.5 10.2* 10.6*  HCT 37.3 37.7 31.4* 33.1*  MCV 94.0 93.5 95.4 95.7  PLT 156 175 150 141*   Cardiac Enzymes:  Recent Labs Lab 10/15/13 0310  TROPONINI <0.30   BNP: BNP (last 3 results)  Recent Labs  10/01/13 1116  PROBNP 1521.0*   CBG:  Recent Labs Lab 10/16/13 1209 10/16/13 1658 10/16/13 2123 10/17/13 0754 10/17/13 1230  GLUCAP 80 80 124* 105* 102*       Signed:  Dia Crawford, MD [facility] [phone] pager

## 2013-10-17 NOTE — Progress Notes (Signed)
Patient discharged home, copies of all discharge medications and instructions reviewed and questions answered. Patient to be assisted to vehicle by wheelchair.

## 2013-10-18 LAB — STOOL CULTURE: SPECIAL REQUESTS: NORMAL

## 2013-10-19 LAB — STOOL CULTURE

## 2013-10-19 LAB — METANEPHRINES, PLASMA
NORMETANEPHRINE FREE: 131 pg/mL (ref <=148)
Total Metanephrines-Plasma: 131 pg/mL (ref <=205)

## 2013-11-04 ENCOUNTER — Institutional Professional Consult (permissible substitution): Payer: Medicare Other | Admitting: Internal Medicine

## 2013-12-03 ENCOUNTER — Encounter: Payer: Self-pay | Admitting: Internal Medicine

## 2013-12-03 ENCOUNTER — Other Ambulatory Visit (INDEPENDENT_AMBULATORY_CARE_PROVIDER_SITE_OTHER): Payer: Medicare Other

## 2013-12-03 ENCOUNTER — Ambulatory Visit (INDEPENDENT_AMBULATORY_CARE_PROVIDER_SITE_OTHER): Payer: Medicare Other | Admitting: Internal Medicine

## 2013-12-03 VITALS — BP 110/54 | HR 85 | Ht 64.0 in | Wt 193.4 lb

## 2013-12-03 DIAGNOSIS — R0600 Dyspnea, unspecified: Secondary | ICD-10-CM

## 2013-12-03 DIAGNOSIS — R0609 Other forms of dyspnea: Secondary | ICD-10-CM

## 2013-12-03 DIAGNOSIS — J449 Chronic obstructive pulmonary disease, unspecified: Secondary | ICD-10-CM

## 2013-12-03 DIAGNOSIS — R0989 Other specified symptoms and signs involving the circulatory and respiratory systems: Secondary | ICD-10-CM

## 2013-12-03 DIAGNOSIS — I509 Heart failure, unspecified: Secondary | ICD-10-CM

## 2013-12-03 LAB — BASIC METABOLIC PANEL
BUN: 16 mg/dL (ref 6–23)
CALCIUM: 9.8 mg/dL (ref 8.4–10.5)
CO2: 32 mEq/L (ref 19–32)
Chloride: 100 mEq/L (ref 96–112)
Creatinine, Ser: 1 mg/dL (ref 0.4–1.2)
GFR: 56.95 mL/min — AB (ref >60.00)
GLUCOSE: 113 mg/dL — AB (ref 70–99)
Potassium: 3.7 mEq/L (ref 3.5–5.1)
Sodium: 139 mEq/L (ref 135–145)

## 2013-12-03 LAB — BRAIN NATRIURETIC PEPTIDE: Pro B Natriuretic peptide (BNP): 626 pg/mL — ABNORMAL HIGH (ref 0.0–100.0)

## 2013-12-03 NOTE — Progress Notes (Signed)
Subjective:    Patient ID: Adrienne [name], female    DOB: [birth year], 80 y.o.   MRN: [id number]  HPI 12/03/13- 70 yoF former smoker Referred by [name] for increased SOB and increase in fatigue and weakness. Son-in-law here [facility] November and January for acute exacerbation of COPD, 1/16-19/ 2015 for diarrhea. O2 2L/ sleep/ Advanced. Persistent dyspnea since November at least. Not aware of heart disease, but has seen Dr.[name]. Little cough. Ankles swell. CT chest had shown edema without pulmonary embolism. Denies history of myocardial infarction, anemia or ventilator dependence. Has had pneumonia. Now taking Lasix 1/2x40 mg tablet twice daily and son-in-law asks about increasing to a whole tablet. Using nebulizer 3 or 4 times daily with transient relief.  Today she was actively complaining of burning discomfort in her left eye after injection earlier by her ophthalmologist. We are getting her out quickly so she can get back there for assessment  this evening before the weekend.  CXR 10/01/13-IMPRESSION:  No acute cardiopulmonary abnormality.  Electronically Signed  By: [name]  On: 10/01/2013 12:13 CTa chest 10/01/13 IMPRESSION:  1. No evidence of acute pulmonary embolus.  2. Cardiomegaly, increased since 2011. Ground-glass opacity in the  lungs suggestive of pulmonary edema superimposed on emphysema.  3. Stable visualized upper abdominal viscera, including polycystic  liver disease and right adrenal adenoma.  4. Atherosclerosis.  5. Chronic appearing mild T7 compression fracture.  Electronically Signed  By: [name]  On: 10/01/2013 14:48  Prior to Admission medications   Medication Sig Start Date End Date Taking? Authorizing Provider  albuterol (PROVENTIL HFA;VENTOLIN HFA) 108 (90 BASE) MCG/ACT inhaler Inhale 2 puffs into the lungs every 6 (six) hours as needed for wheezing or shortness of breath. 10/04/13  Yes Modena Jansky, MD  albuterol (PROVENTIL)  (2.5 MG/3ML) 0.083% nebulizer solution Take 2.5 mg by nebulization every 6 (six) hours.   Yes Historical Provider, MD  aspirin EC 81 MG tablet Take 81 mg by mouth every evening.   Yes Historical Provider, MD  benazepril (LOTENSIN) 40 MG tablet Take 40 mg by mouth daily.   Yes Historical Provider, MD  budesonide (PULMICORT) 0.5 MG/2ML nebulizer solution Take 0.5 mg by nebulization 2 (two) times daily.   Yes Historical Provider, MD  Cholecalciferol (VITAMIN D PO) Take 1 capsule by mouth every evening.   Yes Historical Provider, MD  Coenzyme Q10 (COQ10 PO) Take 10 mLs by mouth daily.   Yes Historical Provider, MD  furosemide (LASIX) 40 MG tablet Take 40 mg by mouth 2 (two) times daily.    Yes Historical Provider, MD  Ketotifen Fumarate (ALAWAY OP) Apply 1 drop to eye 2 (two) times daily as needed. For itchy eyes   Yes Historical Provider, MD  Magnesium 250 MG TABS Take 1 tablet by mouth daily.   Yes Historical Provider, MD  metoprolol succinate (TOPROL-XL) 50 MG 24 hr tablet Take 25 mg by mouth daily. Take with or immediately following a meal.   Yes Historical Provider, MD  Multiple Vitamin (MULTIVITAMIN WITH MINERALS) TABS Take 1 tablet by mouth every evening.   Yes Historical Provider, MD  Multiple Vitamins-Minerals (PRESERVISION AREDS) CAPS Take 1 capsule by mouth 2 (two) times daily.   Yes Historical Provider, MD  Omega-3 Fatty Acids (FISH OIL PO) Take 1 capsule by mouth every evening.   Yes Historical Provider, MD  potassium chloride (K-DUR,KLOR-CON) 10 MEQ tablet Take 10 mEq by mouth daily.   Yes Historical Provider, MD  sertraline (ZOLOFT) 50 MG tablet Take 50 mg by mouth daily.  10/07/13  Yes Historical Provider, MD  tiotropium (SPIRIVA HANDIHALER) 18 MCG inhalation capsule Place 1 capsule (18 mcg total) into inhaler and inhale daily. 10/04/13  Yes Modena Jansky, MD  verapamil (CALAN) 120 MG tablet Take 60 mg by mouth 2 (two) times daily.    Yes Historical Provider, MD  CALCIUM PO Take 1  tablet by mouth 2 (two) times daily.    Historical Provider, MD   Past Medical History  Diagnosis Date  . COPD (chronic obstructive pulmonary disease)   . Arthritis   . Cancer   . Breast cancer     right  . Hypertension   . AKI (acute kidney injury) 10/01/2013  . Shortness of breath   . Diabetes mellitus without complication     TYPE 2   Past Surgical History  Procedure Laterality Date  . Laparoscopic hysterectomy    . Appendectomy    . Laparoscopic cholecystectomy    . Breast surgery Right lumpectomy   Family History  Problem Relation Age of Onset  . Lung cancer Father   . Heart disease Brother    History   Social History  . Marital Status: Married    Spouse Name: N/A    Number of Children: N/A  . Years of Education: N/A   Occupational History  . retired    Social History Main Topics  . Smoking status: Former Smoker -- 2.00 packs/day for 30 years    Types: Cigarettes    Quit date: 02/20/1980  . Smokeless tobacco: Never Used  . Alcohol Use: No  . Drug Use: No  . Sexual Activity: No   Other Topics Concern  . Not on file   Social History Narrative  . No narrative on file     Review of Systems  Constitutional: Positive for unexpected weight change. Negative for fever.  HENT: Positive for congestion and nosebleeds. Negative for dental problem, ear pain, postnasal drip, rhinorrhea, sinus pressure, sneezing, sore throat and trouble swallowing.   Eyes: Positive for redness. Negative for itching.  Respiratory: Positive for shortness of breath. Negative for cough, chest tightness and wheezing.   Cardiovascular: Positive for leg swelling. Negative for palpitations.  Gastrointestinal: Negative for nausea and vomiting.  Genitourinary: Negative for dysuria.  Musculoskeletal: Negative for joint swelling.  Skin: Negative for rash.  Neurological: Negative for headaches.  Hematological: Does not bruise/bleed easily.  Psychiatric/Behavioral: Negative for dysphoric  mood. The patient is not nervous/anxious.       Objective:   Physical Exam OBJ- Physical Exam- +rubbing at left eye General- Alert, Oriented, Affect-appropriate, Distress- +complaining of discomfort left eye after           injection Skin- rash-none, lesions- none, excoriation- none Lymphadenopathy- none Head- atraumatic            Eyes- Gross vision intact, PERRLA, conjunctivae and secretions clear            Ears- Hearing, canals-normal            Nose- Clear, no-Septal dev, mucus, polyps, erosion, perforation             Throat- Mallampati II , mucosa clear , drainage- none, tonsils- atrophic Neck- flexible , trachea midline, no stridor , thyroid nl, carotid no bruit Chest - symmetrical excursion , unlabored           Heart/CV- RRR occ skip , no murmur , no gallop  , no rub,  nl s1 s2                           - JVD+1cm , edema+3+, stasis changes- none, varices- none           Lung- clear to P&A, wheeze- none, cough- none , dullness-none, rub- none           Chest wall-  Abd- tender-no, distended-no, bowel sounds-present, HSM- no Br/ Gen/ Rectal- Not done, not indicated Extrem- cyanosis- none, clubbing, none, atrophy- none, strength- nl Neuro- grossly intact to observation     Assessment & Plan:

## 2013-12-03 NOTE — Patient Instructions (Addendum)
Continue present meds  Increase lasix to 1 whole tab twice daily

## 2013-12-05 DIAGNOSIS — I5032 Chronic diastolic (congestive) heart failure: Secondary | ICD-10-CM | POA: Insufficient documentation

## 2013-12-05 NOTE — Assessment & Plan Note (Signed)
Indicated by chest CT, with ankle edema. She does not have cardiology followup pending-that can be considered by her primary physician's if appropriate. Plan-try to reduce some body water to subtract this component from her dyspnea. BMET, BNP ordered.

## 2013-12-05 NOTE — Assessment & Plan Note (Signed)
She is probably back at realistic baseline after repeated acute exacerbations during the winter. Pulmonary symptoms overlap underlying congestive heart failure also demonstrated on CT. Plan-will first try to subtract any pulmonary edema by allowing increase Lasix to 40 mg twice daily, lab BNP, BMET

## 2013-12-15 ENCOUNTER — Telehealth: Payer: Self-pay | Admitting: Internal Medicine

## 2013-12-15 NOTE — Telephone Encounter (Signed)
Notes Recorded by Lu Duffel on 12/15/2013 at 11:18 AM Attempted to call x1 LMTCB ------  Notes Recorded by Deneise Lever, MD on 12/04/2013 at 6:30 PM Labs- at least mild, chronic fluid overload/ mild heart failure. Better than 2 months ago. Normal kidney function  Pt and daughter advised. [place], CMA

## 2014-01-07 ENCOUNTER — Encounter: Payer: Self-pay | Admitting: Internal Medicine

## 2014-01-07 ENCOUNTER — Ambulatory Visit (INDEPENDENT_AMBULATORY_CARE_PROVIDER_SITE_OTHER): Payer: Medicare Other | Admitting: Internal Medicine

## 2014-01-07 VITALS — BP 136/64 | HR 78 | Ht 64.0 in | Wt 184.0 lb

## 2014-01-07 DIAGNOSIS — J449 Chronic obstructive pulmonary disease, unspecified: Secondary | ICD-10-CM

## 2014-01-07 DIAGNOSIS — N189 Chronic kidney disease, unspecified: Secondary | ICD-10-CM

## 2014-01-07 NOTE — Patient Instructions (Signed)
We can continue present meds. I'm glad the increase in your lasix diuretic dose has helped you breathe better.  Please call as needed

## 2014-01-07 NOTE — Progress Notes (Signed)
Subjective:    Patient ID: Adrienne [name], female    DOB: [birth year], 80 y.o.   MRN: [id number]  HPI 12/03/13- 61 yoF former smoker Referred by Dr. Alyson [name] for increased SOB and increase in fatigue and weakness. Son-in-law here [facility] November and January for acute exacerbation of COPD, 1/16-19/ 2015 for diarrhea. O2 2L/ sleep/ [facility]. Persistent dyspnea since November at least. Not aware of heart disease, but has seen Dr.Paula [name]. Little cough. Ankles swell. CT chest had shown edema without pulmonary embolism. Denies history of myocardial infarction, anemia or ventilator dependence. Has had pneumonia. Now taking Lasix 1/2x40 mg tablet twice daily and son-in-law asks about increasing to a whole tablet. Using nebulizer 3 or 4 times daily with transient relief. Today she was actively complaining of burning discomfort in her left eye after injection earlier by her ophthalmologist. We are getting her out quickly so she can get back there for assessment  this evening before the weekend. CXR 10/01/13-IMPRESSION:  No acute cardiopulmonary abnormality.  Electronically Signed  By: Adrienne [name] M.D.  On: 10/01/2013 12:13 CTa chest 10/01/13 IMPRESSION:  1. No evidence of acute pulmonary embolus.  2. Cardiomegaly, increased since 2011. Ground-glass opacity in the  lungs suggestive of pulmonary edema superimposed on emphysema.  3. Stable visualized upper abdominal viscera, including polycystic  liver disease and right adrenal adenoma.  4. Atherosclerosis.  5. Chronic appearing mild T7 compression fracture.  Electronically Signed  By: Adrienne [name] M.D.  On: 10/01/2013 14:48  01/07/14- 89 yoF former smoker Referred by Dr. Alyson [name] for increased SOB and increase in fatigue and weakness., COPD, complicated by CHF,  DM, CKD                               Son-in-law here FOLLOWS FOR: pt c/o SOB with exertion, has a hard time clearing throat this morning.   Is on 2LPM 02 at night/ [facility] Today she  says breathing is pretty good. Admits some daily dry cough.She is taking benazepril  -discussed ACE inhibitors. Nothing acute. Using rescue inhaler about once a week and nebulizer machine 3 or 4 times a day. Continues Lasix 40 mg twice daily and Spiriva once daily.  ROS-see HPI Constitutional:   No-   weight loss, night sweats, fevers, chills, fatigue, lassitude. HEENT:   No-  headaches, difficulty swallowing, tooth/dental problems, sore throat,       No-  sneezing, itching, ear ache, nasal congestion, post nasal drip,  CV:  No-   chest pain, orthopnea, PND, swelling in lower extremities, anasarca,                                  dizziness, palpitations Resp: No-   shortness of breath with exertion or at rest.              No-   productive cough,  +non-productive cough,  No- coughing up of blood.              No-   change in color of mucus.  No- wheezing.   Skin: No-   rash or lesions. GI:  No-   heartburn, indigestion, abdominal pain, nausea, vomiting,  GU:  MS:  No-   joint pain or swelling.   Neuro-     nothing unusual Psych:  No- change in mood or affect. No depression or anxiety.  No  memory loss.    Objective:   Physical Exam OBJ- Physical Exam General- Alert, Oriented, Affect-appropriate, Distress- none acute Skin- rash-none, lesions- none, excoriation- none Lymphadenopathy- none Head- atraumatic            Eyes- Gross vision intact, PERRLA, conjunctivae and secretions clear            Ears- Hearing, canals-normal            Nose- Clear, no-Septal dev, mucus, polyps, erosion, perforation             Throat- Mallampati II , mucosa clear , drainage- none, tonsils- atrophic Neck- flexible , trachea midline, no stridor , thyroid nl, carotid no bruit Chest - symmetrical excursion , unlabored           Heart/CV- RRR occ skip , no murmur , no gallop  , no rub, nl s1 s2                           - JVD-none , edema-none, stasis changes- none, varices- none           Lung- clear to P&A,  wheeze- none, cough- none , dullness-none, rub- none           Chest wall-  Abd- tender-no, distended-no, bowel sounds-present, HSM- no Br/ Gen/ Rectal- Not done, not indicated Extrem- cyanosis- none, clubbing, none, atrophy- none, strength- nl Neuro- grossly intact to observation     Assessment & Plan:

## 2014-02-04 DIAGNOSIS — N189 Chronic kidney disease, unspecified: Secondary | ICD-10-CM | POA: Insufficient documentation

## 2014-02-04 NOTE — Assessment & Plan Note (Signed)
Fluid overload responded to diuresis

## 2014-02-04 NOTE — Assessment & Plan Note (Signed)
Stable COPD with dyspnea significantly improved after diuresis removed excess volume associated with peripheral edema and neck vein distention.

## 2014-05-06 ENCOUNTER — Emergency Department (HOSPITAL_COMMUNITY): Payer: Medicare Other

## 2014-05-06 ENCOUNTER — Observation Stay (HOSPITAL_COMMUNITY)
Admission: EM | Admit: 2014-05-06 | Discharge: 2014-05-08 | Disposition: A | Discharge status: Home / Self Care | Payer: Medicare Other | Attending: Internal Medicine | Admitting: Internal Medicine

## 2014-05-06 ENCOUNTER — Encounter (HOSPITAL_COMMUNITY): Payer: Self-pay | Admitting: Emergency Medicine

## 2014-05-06 DIAGNOSIS — J4489 Other specified chronic obstructive pulmonary disease: Secondary | ICD-10-CM | POA: Insufficient documentation

## 2014-05-06 DIAGNOSIS — Y92009 Unspecified place in unspecified non-institutional (private) residence as the place of occurrence of the external cause: Secondary | ICD-10-CM | POA: Diagnosis not present

## 2014-05-06 DIAGNOSIS — Z853 Personal history of malignant neoplasm of breast: Secondary | ICD-10-CM | POA: Insufficient documentation

## 2014-05-06 DIAGNOSIS — J449 Chronic obstructive pulmonary disease, unspecified: Secondary | ICD-10-CM | POA: Diagnosis not present

## 2014-05-06 DIAGNOSIS — Y9301 Activity, walking, marching and hiking: Secondary | ICD-10-CM | POA: Diagnosis not present

## 2014-05-06 DIAGNOSIS — J9621 Acute and chronic respiratory failure with hypoxia: Secondary | ICD-10-CM

## 2014-05-06 DIAGNOSIS — J438 Other emphysema: Secondary | ICD-10-CM

## 2014-05-06 DIAGNOSIS — J441 Chronic obstructive pulmonary disease with (acute) exacerbation: Secondary | ICD-10-CM

## 2014-05-06 DIAGNOSIS — D72829 Elevated white blood cell count, unspecified: Secondary | ICD-10-CM | POA: Diagnosis not present

## 2014-05-06 DIAGNOSIS — Z7982 Long term (current) use of aspirin: Secondary | ICD-10-CM | POA: Diagnosis not present

## 2014-05-06 DIAGNOSIS — I10 Essential (primary) hypertension: Secondary | ICD-10-CM | POA: Diagnosis not present

## 2014-05-06 DIAGNOSIS — J9601 Acute respiratory failure with hypoxia: Secondary | ICD-10-CM

## 2014-05-06 DIAGNOSIS — Z90710 Acquired absence of both cervix and uterus: Secondary | ICD-10-CM | POA: Diagnosis not present

## 2014-05-06 DIAGNOSIS — Z9981 Dependence on supplemental oxygen: Secondary | ICD-10-CM | POA: Insufficient documentation

## 2014-05-06 DIAGNOSIS — E119 Type 2 diabetes mellitus without complications: Secondary | ICD-10-CM | POA: Diagnosis not present

## 2014-05-06 DIAGNOSIS — S0101XA Laceration without foreign body of scalp, initial encounter: Secondary | ICD-10-CM

## 2014-05-06 DIAGNOSIS — E860 Dehydration: Secondary | ICD-10-CM

## 2014-05-06 DIAGNOSIS — R112 Nausea with vomiting, unspecified: Secondary | ICD-10-CM

## 2014-05-06 DIAGNOSIS — R531 Weakness: Secondary | ICD-10-CM

## 2014-05-06 DIAGNOSIS — IMO0002 Reserved for concepts with insufficient information to code with codable children: Secondary | ICD-10-CM | POA: Diagnosis not present

## 2014-05-06 DIAGNOSIS — T400X1A Poisoning by opium, accidental (unintentional), initial encounter: Secondary | ICD-10-CM | POA: Insufficient documentation

## 2014-05-06 DIAGNOSIS — S7000XA Contusion of unspecified hip, initial encounter: Secondary | ICD-10-CM

## 2014-05-06 DIAGNOSIS — D320 Benign neoplasm of cerebral meninges: Secondary | ICD-10-CM | POA: Insufficient documentation

## 2014-05-06 DIAGNOSIS — N179 Acute kidney failure, unspecified: Secondary | ICD-10-CM

## 2014-05-06 DIAGNOSIS — R7989 Other specified abnormal findings of blood chemistry: Secondary | ICD-10-CM

## 2014-05-06 DIAGNOSIS — J9620 Acute and chronic respiratory failure, unspecified whether with hypoxia or hypercapnia: Secondary | ICD-10-CM | POA: Diagnosis not present

## 2014-05-06 DIAGNOSIS — R197 Diarrhea, unspecified: Secondary | ICD-10-CM

## 2014-05-06 DIAGNOSIS — M129 Arthropathy, unspecified: Secondary | ICD-10-CM | POA: Diagnosis not present

## 2014-05-06 DIAGNOSIS — W010XXA Fall on same level from slipping, tripping and stumbling without subsequent striking against object, initial encounter: Secondary | ICD-10-CM | POA: Insufficient documentation

## 2014-05-06 DIAGNOSIS — W19XXXA Unspecified fall, initial encounter: Secondary | ICD-10-CM

## 2014-05-06 DIAGNOSIS — Z79899 Other long term (current) drug therapy: Secondary | ICD-10-CM | POA: Diagnosis not present

## 2014-05-06 DIAGNOSIS — Z9089 Acquired absence of other organs: Secondary | ICD-10-CM | POA: Insufficient documentation

## 2014-05-06 DIAGNOSIS — Z87891 Personal history of nicotine dependence: Secondary | ICD-10-CM | POA: Diagnosis not present

## 2014-05-06 DIAGNOSIS — R55 Syncope and collapse: Secondary | ICD-10-CM | POA: Diagnosis present

## 2014-05-06 DIAGNOSIS — M549 Dorsalgia, unspecified: Secondary | ICD-10-CM | POA: Insufficient documentation

## 2014-05-06 NOTE — ED Notes (Signed)
The pt arrived by gems from home.  The pt has frequent fLLS BUT TODAY WHILE SHE WAS ALONE SHE FELL X 2.  SHE DOES NOT REMEMBER THE FALLS.  PAIN AND BRUUISE RT TEMPLE  AREA.  SHE IS ALSO C/O SOME THORACIC BACK PAIN.  ALERT NO DISTRESS.  ALERT AND ORIENTED.  SKIN WARM AND DRY

## 2014-05-06 NOTE — ED Provider Notes (Signed)
CSN: [id number]     Arrival date & time 05/06/14  2312 History   First MD Initiated Contact with Patient 05/06/14 2320     Chief Complaint  Patient presents with  . Fall     (Consider location/radiation/quality/duration/timing/severity/associated sxs/prior Treatment) HPI  This 80 yo woman  With COPD, HTN & DM2 comes in from the house where she lives with her daughter and family. The patient lost her balance while ambulating with her walker and fell. The family came home around 2100 to find her on kitchen floor. She was arousable and the family helped her back to seated then standing position on her rollator Family estimates that the patient fell around 1800.   Patient is without pain at this time. However, she has a laceration to her right scalp which daughter irrigated PTA. Dtr says that the patient has exhibited no acute mental status changes. The patient is not anticoagulated.   Past Medical History  Diagnosis Date  . COPD (chronic obstructive pulmonary disease)   . Arthritis   . Cancer   . Breast cancer     right  . Hypertension   . AKI (acute kidney injury) 10/01/2013  . Shortness of breath   . Diabetes mellitus without complication     TYPE 2   Past Surgical History  Procedure Laterality Date  . Laparoscopic hysterectomy    . Appendectomy    . Laparoscopic cholecystectomy    . Breast surgery Right lumpectomy   Family History  Problem Relation Age of Onset  . Lung cancer Father   . Heart disease Brother    History  Substance Use Topics  . Smoking status: Former Smoker -- 2.00 packs/day for 30 years    Types: Cigarettes    Quit date: 02/20/1980  . Smokeless tobacco: Never Used  . Alcohol Use: No   OB History   Grav Para Term Preterm Abortions TAB SAB Ect Mult Living                 Review of Systems  10 point review of symptoms obtained and is negative with the exceptions of symptoms noted abov.e   Allergies  Tetanus toxoids; Latex; and  Penicillins  Home Medications   Prior to Admission medications   Medication Sig Start Date End Date Taking? Authorizing Provider  albuterol (PROVENTIL HFA;VENTOLIN HFA) 108 (90 BASE) MCG/ACT inhaler Inhale 2 puffs into the lungs every 6 (six) hours as needed for wheezing or shortness of breath. 10/04/13   Modena Jansky, MD  albuterol (PROVENTIL) (2.5 MG/3ML) 0.083% nebulizer solution Take 2.5 mg by nebulization every 6 (six) hours.    Historical Provider, MD  aspirin EC 81 MG tablet Take 81 mg by mouth every evening.    Historical Provider, MD  benazepril (LOTENSIN) 40 MG tablet Take 40 mg by mouth daily.    Historical Provider, MD  budesonide (PULMICORT) 0.5 MG/2ML nebulizer solution Take 0.5 mg by nebulization 2 (two) times daily.    Historical Provider, MD  CALCIUM PO Take 1 tablet by mouth daily.     Historical Provider, MD  Cholecalciferol (VITAMIN D PO) Take 1 capsule by mouth every evening.    Historical Provider, MD  Coenzyme Q10 (COQ10 PO) Take 10 mLs by mouth daily.    Historical Provider, MD  furosemide (LASIX) 40 MG tablet Take 40 mg by mouth 2 (two) times daily.     Historical Provider, MD  Ketotifen Fumarate (ALAWAY OP) Apply 1 drop to eye 2 (two) times  daily as needed. Both eyes For itchy eyes    Historical Provider, MD  Magnesium 250 MG TABS Take 1 tablet by mouth daily.    Historical Provider, MD  metoprolol succinate (TOPROL-XL) 50 MG 24 hr tablet Take 25 mg by mouth daily. Take with or immediately following a meal.    Historical Provider, MD  Multiple Vitamin (MULTIVITAMIN WITH MINERALS) TABS Take 1 tablet by mouth every evening.    Historical Provider, MD  Multiple Vitamins-Minerals (PRESERVISION AREDS) CAPS Take 1 capsule by mouth 2 (two) times daily.    Historical Provider, MD  Omega-3 Fatty Acids (FISH OIL PO) Take 1 capsule by mouth every evening.    Historical Provider, MD  potassium chloride (K-DUR,KLOR-CON) 10 MEQ tablet Take 10 mEq by mouth daily.    Historical  Provider, MD  sertraline (ZOLOFT) 50 MG tablet Take 50 mg by mouth daily.  10/07/13   Historical Provider, MD  tiotropium (SPIRIVA HANDIHALER) 18 MCG inhalation capsule Place 1 capsule (18 mcg total) into inhaler and inhale daily. 10/04/13   Modena Jansky, MD  verapamil (CALAN) 120 MG tablet Take 60 mg by mouth 2 (two) times daily.     Historical Provider, MD   BP 138/61  Pulse 78  Temp(Src) 98 F (36.7 C)  Resp 20  SpO2 99% Physical Exam  Gen: well nourished and well developed appearing Head: 2cm stellate laceration to right parietal scalp without active bleeding, otherwise - NCAT Ears: normal to inspection Nose: normal to inspection, no epistaxis or drainage Mouth: oral mucsoa is well hydrated appearing, normal posterior oropharynx Neck: [place] J collar in place, no midline ttp.  Chest wall: nontender and normal to inspection.  CV: RRR, no murmur, palpable peripheral pulses Resp: lung sounds are clear to auscultation bilaterally, no wheeing or rhonchi or rales, normal respiratory effort.  Abd: soft, nontender, nondistended Extremities: normal to inspection, FROM without pain - all 4 extremities, All 4 ext are also nontender.  Skin: warm and dry Neuro: CN ii - XII, no focal deficitis, gcs 15.  Psyche; normal affect, cooperative.   ED Course  Procedures (including critical care time) Labs Review  Results for orders placed during the hospital encounter of 05/06/14 (from the past 24 hour(s))  CBG MONITORING, ED     Status: Abnormal   Collection Time    05/07/14  1:40 AM      Result Value Ref Range   Glucose-Capillary 122 (*) 70 - 99 mg/dL   Comment 1 Notify RN     Comment 2 Documented in Chart    BASIC METABOLIC PANEL     Status: Abnormal   Collection Time    05/07/14  1:40 AM      Result Value Ref Range   Sodium 141  137 - 147 mEq/L   Potassium 4.1  3.7 - 5.3 mEq/L   Chloride 99  96 - 112 mEq/L   CO2 28  19 - 32 mEq/L   Glucose, Bld 123 (*) 70 - 99 mg/dL   BUN 24 (*) 6  - 23 mg/dL   Creatinine, Ser 1.04  0.50 - 1.10 mg/dL   Calcium 9.4  8.4 - 10.5 mg/dL   GFR calc non Af Amer 47 (*) >90 mL/min   GFR calc Af Amer 54 (*) >90 mL/min   Anion gap 14  5 - 15  CBC WITH DIFFERENTIAL     Status: Abnormal   Collection Time    05/07/14  1:40 AM  Result Value Ref Range   WBC 16.2 (*) 4.0 - 10.5 K/uL   RBC 4.34  3.87 - 5.11 MIL/uL   Hemoglobin 12.2  12.0 - 15.0 g/dL   HCT 38.0  36.0 - 46.0 %   MCV 87.6  78.0 - 100.0 fL   MCH 28.1  26.0 - 34.0 pg   MCHC 32.1  30.0 - 36.0 g/dL   RDW 19.4 (*) 11.5 - 15.5 %   Platelets 186  150 - 400 K/uL   Neutrophils Relative % 82 (*) 43 - 77 %   Neutro Abs 13.3 (*) 1.7 - 7.7 K/uL   Lymphocytes Relative 9 (*) 12 - 46 %   Lymphs Abs 1.4  0.7 - 4.0 K/uL   Monocytes Relative 9  3 - 12 %   Monocytes Absolute 1.4 (*) 0.1 - 1.0 K/uL   Eosinophils Relative 1  0 - 5 %   Eosinophils Absolute 0.1  0.0 - 0.7 K/uL   Basophils Relative 0  0 - 1 %   Basophils Absolute 0.0  0.0 - 0.1 K/uL  CK     Status: None   Collection Time    05/07/14  1:40 AM      Result Value Ref Range   Total CK 110  7 - 177 U/L  I-STAT TROPOININ, ED     Status: None   Collection Time    05/07/14  1:42 AM      Result Value Ref Range   Troponin i, poc 0.00  0.00 - 0.08 ng/mL   Comment 3            DG Lumbar Spine 2-3 Views (Final result)  Result time: 05/07/14 02:55:07    Final result by Rad Results In Interface (05/07/14 02:55:07)    Narrative:   CLINICAL DATA: 80 year old female with pain after fall. Initial encounter.  EXAM: LUMBAR SPINE - 2-3 VIEW  COMPARISON: Thoracic series from the same day reported separately. CT Abdomen and Pelvis 10/14/2013.  FINDINGS: Normal lumbar segmentation. Stable grade 1 anterolisthesis at L4-L5 with posterior interspinous spacer device at that level. Stable lumbar vertebral height and alignment elsewhere. Lower lumbar posterior element fusion. Aortoiliac calcified atherosclerosis noted. Stable right upper  quadrant surgical clips. Sacrum appears stable.  IMPRESSION: No acute fracture or listhesis identified in the lumbar spine.   Electronically Signed By: [name] On: 05/07/2014 02:55             DG Thoracic Spine W/Swimmers (Final result)  Result time: 05/07/14 02:53:48    Procedure changed from [facility] Thoracic Spine 2 View       Final result by Rad Results In Interface (05/07/14 02:53:48)    Narrative:   CLINICAL DATA: 80 year old female status post fall with pain. Initial encounter.  EXAM: THORACIC SPINE - 2 VIEW + SWIMMERS  COMPARISON: Chest CTA 10/01/2013.  FINDINGS: Osteopenia. Calcified atherosclerosis of the aorta. Normal thoracic segmentation. Stable mild superior in plain deformity at T7, chronic. Stable thoracic vertebral height and alignment. Cervicothoracic junction alignment is within normal limits. Grossly intact visualized upper lumbar levels.  IMPRESSION: No acute fracture or listhesis identified in the thoracic spine.   Electronically Signed By: [name] On: 05/07/2014 02:53             DG Pelvis Portable (Final result)  Result time: 05/07/14 01:22:17    Final result by Rad Results In Interface (05/07/14 01:22:17)    Narrative:   CLINICAL DATA: Multiple falls. Back pain.  EXAM:  PORTABLE PELVIS 1-2 VIEWS  COMPARISON: None.  FINDINGS: There is no evidence of pelvic fracture or diastasis. No other pelvic bone lesions are seen.  L4-5 posterior element fixation.  IMPRESSION: No acute osseous findings.   Electronically Signed By: Jorje Guild M.D. On: 05/07/2014 01:22             DG Chest Port 1 View (Final result)  Result time: 05/07/14 01:24:25    Procedure changed from [facility] Chest 1 View       Final result by Rad Results In Interface (05/07/14 01:24:25)    Narrative:   CLINICAL DATA: 80 year old female status post fall. Initial encounter.  EXAM: PORTABLE CHEST - 1 VIEW  COMPARISON: 10/01/2013 and  earlier.  FINDINGS: Portable AP view at 0100 hrs. Stable lung volumes. No pneumothorax, pulmonary edema, pleural effusion or consolidation. Stable cardiac size and mediastinal contours. Visualized tracheal air column is within normal limits. Stable surgical clip right chest wall.  IMPRESSION: No acute cardiopulmonary abnormality.   Electronically Signed By: [name] On: 05/07/2014 01:24             CT Head Wo Contrast (Final result)  Result time: 05/07/14 00:47:14    Final result by Rad Results In Interface (05/07/14 00:47:14)    Narrative:   CLINICAL DATA: 80 year old female with falls. Pain. Initial encounter.  EXAM: CT HEAD WITHOUT CONTRAST  CT CERVICAL SPINE WITHOUT CONTRAST  TECHNIQUE: Multidetector CT imaging of the head and cervical spine was performed following the standard protocol without intravenous contrast. Multiplanar CT image reconstructions of the cervical spine were also generated.  COMPARISON: None.  FINDINGS: CT HEAD FINDINGS  Visualized paranasal sinuses and mastoids are clear. Calcified atherosclerosis at the skull base. No acute orbits soft tissue abnormality. Right lateral scalp laceration and hematoma, the latter measuring up to 9 mm in thickness. Underlying calvarium intact. No other scalp soft tissue abnormality identified.  Hyperdense rounded extra-axial mass in the left posterior fossa measuring 21 mm diameter compatible with meningioma. Mild mass effect on the left cerebellar hemisphere, no cerebellar edema CT.  Patchy and confluent cerebral white matter hypodensity. Cerebral volume is within normal limits for age. No ventriculomegaly. No midline shift. Basilar cisterns are patent. No acute intracranial hemorrhage identified. No evidence of cortically based acute infarction identified.  CT CERVICAL SPINE FINDINGS  Straightening of cervical lordosis. Visualized skull base is intact. No atlanto-occipital dissociation.  chronic disc and endplate degeneration H0-W2 through C6-C7. Trace anterolisthesis at C4-C5, with associated moderate facet hypertrophy greater on the right. Multifactorial spinal stenosis maximal at C5-C6. Cervicothoracic junction alignment is within normal limits. Bilateral posterior element alignment is within normal limits. No acute cervical spine fracture identified.  Apical lung scarring. Widespread calcified atherosclerosis. Retropharyngeal course of both carotids. Grossly intact visible upper thoracic levels.  IMPRESSION: 1. Right scalp soft tissue injury without underlying fracture. 2. No acute intracranial abnormality. Left cerebellar meningioma, 21 mm diameter. 3. No acute fracture or listhesis identified in the cervical spine. Ligamentous injury is not excluded. 4. Degenerative changes with spinal stenosis maximal at C5-C6.   Electronically Signed By: [name] On: 05/07/2014 00:47             CT Cervical Spine Wo Contrast (Final result)  Result time: 05/07/14 00:47:14    Final result by Rad Results In Interface (05/07/14 00:47:14)    Narrative:   CLINICAL DATA: 80 year old female with falls. Pain. Initial encounter.  EXAM: CT HEAD WITHOUT CONTRAST  CT CERVICAL SPINE WITHOUT CONTRAST  TECHNIQUE: Multidetector CT imaging of the head and cervical spine was performed following the standard protocol without intravenous contrast. Multiplanar CT image reconstructions of the cervical spine were also generated.  COMPARISON: None.  FINDINGS: CT HEAD FINDINGS  Visualized paranasal sinuses and mastoids are clear. Calcified atherosclerosis at the skull base. No acute orbits soft tissue abnormality. Right lateral scalp laceration and hematoma, the latter measuring up to 9 mm in thickness. Underlying calvarium intact. No other scalp soft tissue abnormality identified.  Hyperdense rounded extra-axial mass in the left posterior fossa measuring 21 mm  diameter compatible with meningioma. Mild mass effect on the left cerebellar hemisphere, no cerebellar edema CT.  Patchy and confluent cerebral white matter hypodensity. Cerebral volume is within normal limits for age. No ventriculomegaly. No midline shift. Basilar cisterns are patent. No acute intracranial hemorrhage identified. No evidence of cortically based acute infarction identified.  CT CERVICAL SPINE FINDINGS  Straightening of cervical lordosis. Visualized skull base is intact. No atlanto-occipital dissociation. chronic disc and endplate degeneration I2-L7 through C6-C7. Trace anterolisthesis at C4-C5, with associated moderate facet hypertrophy greater on the right. Multifactorial spinal stenosis maximal at C5-C6. Cervicothoracic junction alignment is within normal limits. Bilateral posterior element alignment is within normal limits. No acute cervical spine fracture identified.  Apical lung scarring. Widespread calcified atherosclerosis. Retropharyngeal course of both carotids. Grossly intact visible upper thoracic levels.  IMPRESSION: 1. Right scalp soft tissue injury without underlying fracture. 2. No acute intracranial abnormality. Left cerebellar meningioma, 21 mm diameter. 3. No acute fracture or listhesis identified in the cervical spine. Ligamentous injury is not excluded. 4. Degenerative changes with spinal stenosis maximal at C5-C6.   Electronically Signed By: [name] On: 05/07/2014 00:47          MDM   DDX: ICH, cervical spine injury, scalp contusion and laceration, rib fx, pelvic fx, anemia, dehydration.   No acute injuries identified on cxr, t spine, l spine, ct head and c spine.    Dtr reported that the patient had an episode of near syncope while sitting up in bed and eating and that she had perioral cyanosis. This was not witnessed by staff. The patient was on the cardiac monitor at the time and there was no dysrythmia. Patient will be  admitted for observation.     Elyn Peers, MD 05/07/14 [phone]

## 2014-05-07 ENCOUNTER — Encounter (HOSPITAL_COMMUNITY): Payer: Self-pay | Admitting: *Deleted

## 2014-05-07 ENCOUNTER — Emergency Department (HOSPITAL_COMMUNITY): Payer: Medicare Other

## 2014-05-07 DIAGNOSIS — I10 Essential (primary) hypertension: Secondary | ICD-10-CM

## 2014-05-07 DIAGNOSIS — J9620 Acute and chronic respiratory failure, unspecified whether with hypoxia or hypercapnia: Secondary | ICD-10-CM

## 2014-05-07 DIAGNOSIS — R55 Syncope and collapse: Secondary | ICD-10-CM

## 2014-05-07 DIAGNOSIS — R0902 Hypoxemia: Secondary | ICD-10-CM

## 2014-05-07 DIAGNOSIS — J438 Other emphysema: Secondary | ICD-10-CM

## 2014-05-07 LAB — I-STAT TROPONIN, ED: Troponin i, poc: 0 ng/mL (ref 0.00–0.08)

## 2014-05-07 LAB — BASIC METABOLIC PANEL
Anion gap: 14 (ref 5–15)
BUN: 24 mg/dL — ABNORMAL HIGH (ref 6–23)
CO2: 28 mEq/L (ref 19–32)
CREATININE: 1.04 mg/dL (ref 0.50–1.10)
Calcium: 9.4 mg/dL (ref 8.4–10.5)
Chloride: 99 mEq/L (ref 96–112)
GFR calc Af Amer: 54 mL/min — ABNORMAL LOW (ref >90)
GFR calc non Af Amer: 47 mL/min — ABNORMAL LOW (ref >90)
GLUCOSE: 123 mg/dL — AB (ref 70–99)
Potassium: 4.1 mEq/L (ref 3.7–5.3)
Sodium: 141 mEq/L (ref 137–147)

## 2014-05-07 LAB — CBG MONITORING, ED: GLUCOSE-CAPILLARY: 122 mg/dL — AB (ref 70–99)

## 2014-05-07 LAB — CBC WITH DIFFERENTIAL/PLATELET
Basophils Absolute: 0 10*3/uL (ref 0.0–0.1)
Basophils Relative: 0 % (ref 0–1)
Eosinophils Absolute: 0.1 10*3/uL (ref 0.0–0.7)
Eosinophils Relative: 1 % (ref 0–5)
HEMATOCRIT: 38 % (ref 36.0–46.0)
Hemoglobin: 12.2 g/dL (ref 12.0–15.0)
LYMPHS PCT: 9 % — AB (ref 12–46)
Lymphs Abs: 1.4 10*3/uL (ref 0.7–4.0)
MCH: 28.1 pg (ref 26.0–34.0)
MCHC: 32.1 g/dL (ref 30.0–36.0)
MCV: 87.6 fL (ref 78.0–100.0)
MONO ABS: 1.4 10*3/uL — AB (ref 0.1–1.0)
MONOS PCT: 9 % (ref 3–12)
Neutro Abs: 13.3 10*3/uL — ABNORMAL HIGH (ref 1.7–7.7)
Neutrophils Relative %: 82 % — ABNORMAL HIGH (ref 43–77)
Platelets: 186 10*3/uL (ref 150–400)
RBC: 4.34 MIL/uL (ref 3.87–5.11)
RDW: 19.4 % — AB (ref 11.5–15.5)
WBC: 16.2 10*3/uL — AB (ref 4.0–10.5)

## 2014-05-07 LAB — URINALYSIS, ROUTINE W REFLEX MICROSCOPIC
BILIRUBIN URINE: NEGATIVE
Glucose, UA: NEGATIVE mg/dL
HGB URINE DIPSTICK: NEGATIVE
Ketones, ur: NEGATIVE mg/dL
Nitrite: NEGATIVE
PH: 5 (ref 5.0–8.0)
Protein, ur: NEGATIVE mg/dL
SPECIFIC GRAVITY, URINE: 1.016 (ref 1.005–1.030)
Urobilinogen, UA: 0.2 mg/dL (ref 0.0–1.0)

## 2014-05-07 LAB — URINE MICROSCOPIC-ADD ON

## 2014-05-07 LAB — CK: Total CK: 110 U/L (ref 7–177)

## 2014-05-07 MED ORDER — TIOTROPIUM BROMIDE MONOHYDRATE 18 MCG IN CAPS
18.0000 ug | ORAL_CAPSULE | Freq: Every day | RESPIRATORY_TRACT | Status: DC
  Administered 2014-05-07 – 2014-05-08 (×2): 18 ug via RESPIRATORY_TRACT
  Filled 2014-05-07: qty 5

## 2014-05-07 MED ORDER — ADULT MULTIVITAMIN W/MINERALS CH
1.0000 | ORAL_TABLET | Freq: Every evening | ORAL | Status: DC
  Administered 2014-05-07: 1 via ORAL
  Filled 2014-05-07 (×2): qty 1

## 2014-05-07 MED ORDER — MAGNESIUM OXIDE 400 (241.3 MG) MG PO TABS
200.0000 mg | ORAL_TABLET | Freq: Every day | ORAL | Status: DC
  Administered 2014-05-07 – 2014-05-08 (×2): 200 mg via ORAL
  Filled 2014-05-07 (×2): qty 0.5

## 2014-05-07 MED ORDER — ALBUTEROL SULFATE (2.5 MG/3ML) 0.083% IN NEBU
2.5000 mg | INHALATION_SOLUTION | Freq: Four times a day (QID) | RESPIRATORY_TRACT | Status: DC
  Administered 2014-05-07 – 2014-05-08 (×4): 2.5 mg via RESPIRATORY_TRACT
  Filled 2014-05-07 (×4): qty 3

## 2014-05-07 MED ORDER — MAGNESIUM 250 MG PO TABS: 1.0000 | ORAL_TABLET | Freq: Every day | ORAL | Status: DC

## 2014-05-07 MED ORDER — HEPARIN SODIUM (PORCINE) 5000 UNIT/ML IJ SOLN
5000.0000 [IU] | Freq: Three times a day (TID) | INTRAMUSCULAR | Status: DC
  Administered 2014-05-07 – 2014-05-08 (×3): 5000 [IU] via SUBCUTANEOUS
  Filled 2014-05-07 (×5): qty 1

## 2014-05-07 MED ORDER — FENTANYL CITRATE 0.05 MG/ML IJ SOLN
50.0000 ug | Freq: Once | INTRAMUSCULAR | Status: AC
  Administered 2014-05-07: 50 ug via INTRAVENOUS
  Filled 2014-05-07: qty 2

## 2014-05-07 MED ORDER — ASPIRIN EC 81 MG PO TBEC
81.0000 mg | DELAYED_RELEASE_TABLET | Freq: Every evening | ORAL | Status: DC
  Administered 2014-05-07: 81 mg via ORAL
  Filled 2014-05-07 (×2): qty 1

## 2014-05-07 MED ORDER — FUROSEMIDE 40 MG PO TABS
40.0000 mg | ORAL_TABLET | Freq: Two times a day (BID) | ORAL | Status: DC
  Administered 2014-05-07 – 2014-05-08 (×2): 40 mg via ORAL
  Filled 2014-05-07 (×4): qty 1

## 2014-05-07 MED ORDER — ONDANSETRON HCL 4 MG PO TABS: 4.0000 mg | ORAL_TABLET | Freq: Four times a day (QID) | ORAL | Status: DC | PRN

## 2014-05-07 MED ORDER — SERTRALINE HCL 50 MG PO TABS
50.0000 mg | ORAL_TABLET | Freq: Every day | ORAL | Status: DC
  Administered 2014-05-07 – 2014-05-08 (×2): 50 mg via ORAL
  Filled 2014-05-07 (×2): qty 1

## 2014-05-07 MED ORDER — OXYCODONE-ACETAMINOPHEN 5-325 MG PO TABS
2.0000 | ORAL_TABLET | Freq: Once | ORAL | Status: AC
  Administered 2014-05-07: 2 via ORAL
  Filled 2014-05-07: qty 2

## 2014-05-07 MED ORDER — METHOCARBAMOL 500 MG PO TABS
500.0000 mg | ORAL_TABLET | Freq: Four times a day (QID) | ORAL | Status: DC | PRN
  Filled 2014-05-07: qty 1

## 2014-05-07 MED ORDER — OXYCODONE HCL 5 MG PO TABS
5.0000 mg | ORAL_TABLET | ORAL | Status: DC | PRN
  Administered 2014-05-07: 5 mg via ORAL
  Filled 2014-05-07: qty 1

## 2014-05-07 MED ORDER — OMEGA-3-ACID ETHYL ESTERS 1 G PO CAPS
1.0000 g | ORAL_CAPSULE | Freq: Every day | ORAL | Status: DC
  Administered 2014-05-07 – 2014-05-08 (×2): 1 g via ORAL
  Filled 2014-05-07 (×2): qty 1

## 2014-05-07 MED ORDER — ONDANSETRON HCL 4 MG/2ML IJ SOLN: 4.0000 mg | Freq: Four times a day (QID) | INTRAMUSCULAR | Status: DC | PRN

## 2014-05-07 MED ORDER — SODIUM CHLORIDE 0.9 % IJ SOLN
3.0000 mL | Freq: Two times a day (BID) | INTRAMUSCULAR | Status: DC
  Administered 2014-05-07 – 2014-05-08 (×3): 3 mL via INTRAVENOUS

## 2014-05-07 MED ORDER — ACETAMINOPHEN 650 MG RE SUPP
650.0000 mg | Freq: Four times a day (QID) | RECTAL | Status: DC | PRN
  Filled 2014-05-07: qty 1

## 2014-05-07 MED ORDER — ACETAMINOPHEN 325 MG PO TABS
650.0000 mg | ORAL_TABLET | Freq: Four times a day (QID) | ORAL | Status: DC | PRN
  Administered 2014-05-08: 650 mg via ORAL
  Filled 2014-05-07: qty 2

## 2014-05-07 MED ORDER — VERAPAMIL HCL 120 MG PO TABS
60.0000 mg | ORAL_TABLET | Freq: Two times a day (BID) | ORAL | Status: DC
  Administered 2014-05-07 – 2014-05-08 (×3): 60 mg via ORAL
  Filled 2014-05-07 (×5): qty 0.5

## 2014-05-07 MED ORDER — METOPROLOL SUCCINATE ER 25 MG PO TB24
25.0000 mg | ORAL_TABLET | Freq: Every day | ORAL | Status: DC
  Administered 2014-05-07 – 2014-05-08 (×2): 25 mg via ORAL
  Filled 2014-05-07 (×2): qty 1

## 2014-05-07 MED ORDER — POTASSIUM CHLORIDE CRYS ER 10 MEQ PO TBCR
10.0000 meq | EXTENDED_RELEASE_TABLET | Freq: Every day | ORAL | Status: DC
  Administered 2014-05-07 – 2014-05-08 (×2): 10 meq via ORAL
  Filled 2014-05-07 (×2): qty 1

## 2014-05-07 MED ORDER — OCUVITE-LUTEIN PO CAPS
1.0000 | ORAL_CAPSULE | Freq: Two times a day (BID) | ORAL | Status: DC
  Administered 2014-05-07 – 2014-05-08 (×3): 1 via ORAL
  Filled 2014-05-07 (×5): qty 1

## 2014-05-07 MED ORDER — BUDESONIDE 0.5 MG/2ML IN SUSP
0.5000 mg | Freq: Two times a day (BID) | RESPIRATORY_TRACT | Status: DC
  Administered 2014-05-07 – 2014-05-08 (×3): 0.5 mg via RESPIRATORY_TRACT
  Filled 2014-05-07 (×6): qty 2

## 2014-05-07 NOTE — ED Notes (Signed)
The pt has a cut to the rt side of her head 1/2 "  Minimal bleeding.  Cleaned with soap and water.  Minimal bleeding.  No loc

## 2014-05-07 NOTE — ED Notes (Signed)
Patient transported to X-ray 

## 2014-05-07 NOTE — ED Notes (Signed)
Abigail, PA at the bedside to fix laceration.

## 2014-05-07 NOTE — ED Notes (Signed)
The pt returned from xray.  Asking for more pain med

## 2014-05-07 NOTE — ED Notes (Signed)
Patient denies pain and is resting comfortably.  

## 2014-05-07 NOTE — Progress Notes (Signed)
Raised area back of head.  Staples intact to right side of head.  Bruising right hip with small skin tear, meteplex applied.  Able move all four extremeties.  Dried blood in hair.

## 2014-05-07 NOTE — Evaluation (Signed)
Physical Therapy Evaluation Patient Details Name: Adrienne [name] MRN: [id number] DOB: [birth year] Today's Date: 05/07/2014   History of Present Illness  80 yo female admitted 05/06/14 after fall  at home, laceration to head. While in ED onset respiratory distres, possibly due to pain meds.   Clinical Impression  Pt c/o back pain after ambulating to BR and back. CNA in and stated pt's BP has been high. Assisted pt back into bed and placed on L side with some back pain relief. CNA to return for BP after pt. Rests a few minutes. Per doc flow sheets BP 139/53. Pt will benefit from PT to address problems listed in note below.    Follow Up Recommendations Home health PT;Supervision/Assistance - 24 hour    Equipment Recommendations  None recommended by PT    Recommendations for Other Services       Precautions / Restrictions Precautions Precautions: Fall Precaution Comments: monitor BP      Mobility  Bed Mobility Overal bed mobility: Needs Assistance Bed Mobility: Sit to Supine       Sit to supine: Mod assist   General bed mobility comments: for legs onto bed   Transfers Overall transfer level: Needs assistance Equipment used: Rolling walker (2 wheeled) Transfers: Sit to/from Stand Sit to Stand: Mod assist         General transfer comment: from low toilet. lifting assist to stand.  Ambulation/Gait Ambulation/Gait assistance: Min assist Ambulation Distance (Feet): 15 Feet (x2) Assistive device: Rolling walker (2 wheeled) Gait Pattern/deviations: Step-through pattern;Decreased step length - right;Decreased step length - left;Trunk flexed     General Gait Details: pt tends to lean onto RW, cues for safety. Pt appeared to be "giving out" due to c/o back pain  Stairs            Wheelchair Mobility    Modified Rankin (Stroke Patients Only)       Balance                                             Pertinent Vitals/Pain Pain  Assessment: 0-10 Pain Score: 7  Pain Location: back pain Pain Descriptors / Indicators: Aching;Cramping Pain Intervention(s): Limited activity within patient's tolerance;Repositioned    Home Living Family/patient expects to be discharged to:: Private residence Living Arrangements: Children Available Help at Discharge: Family;Available PRN/intermittently Type of Home: House Home Access: Ramped entrance     Home Layout: One level Home Equipment: Cane - single point;Bedside commode;Shower seat;Wheelchair - [employer] - 4 wheels;Walker - 2 wheels      Prior Function Level of Independence: Independent               Hand Dominance        Extremity/Trunk Assessment   Upper Extremity Assessment: Generalized weakness           Lower Extremity Assessment: Generalized weakness      Cervical / Trunk Assessment: Kyphotic  Communication   Communication: No difficulties;HOH  Cognition Arousal/Alertness: Awake/alert Behavior During Therapy: WFL for tasks assessed/performed Overall Cognitive Status: Within Functional Limits for tasks assessed                      General Comments      Exercises        Assessment/Plan    PT Assessment Patient needs continued PT services  PT Diagnosis Difficulty walking;Acute  pain   PT Problem List Decreased strength;Decreased activity tolerance;Decreased mobility;Decreased knowledge of precautions;Cardiopulmonary status limiting activity  PT Treatment Interventions DME instruction;Gait training;Functional mobility training;Therapeutic activities;Patient/family education   PT Goals (Current goals can be found in the Care Plan section) Acute Rehab PT Goals Patient Stated Goal: to get back to being independent PT Goal Formulation: With patient Time For Goal Achievement: 05/21/14 Potential to Achieve Goals: Good    Frequency Min 3X/week   Barriers to discharge        [name]-evaluation               End of  Session Equipment Utilized During Treatment: Gait belt Activity Tolerance: Patient limited by pain Patient left: in bed;with call bell/phone within reach           Time: 1535-1550 PT Time Calculation (min): 15 min   Charges:   PT Evaluation $Initial PT Evaluation Tier I: 1 Procedure PT Treatments $Gait Training: 8-22 mins   PT G Codes:          Adrienne [name] 05/07/2014, 4:10 PM

## 2014-05-07 NOTE — ED Notes (Signed)
The pt  Asking for more pain med .  She reports that the last did not help

## 2014-05-07 NOTE — ED Provider Notes (Signed)
I was called and asked to perform this procedure in the Emergency Department. I was not involved in the medical decision making and the care of this patient during their stay int he ED. See procedure note below for details.  LACERATION REPAIR Performed by: Nobie Putnam Authorized by: Nobie Putnam Consent: Verbal consent obtained. Risks and benefits: risks, benefits and alternatives were discussed Consent given by: patient Patient identity confirmed: provided demographic data Prepped and Draped in normal sterile fashion Wound explored, clean with sterile gauze with saline and peroxide  Laceration Location: Right fronto-temporal scalp  Laceration Length: 2.5 cm  No Foreign Bodies seen or palpated  Anesthesia: none  Local anesthetic: none  Anesthetic total: 0 ml  Irrigation method: syringe Amount of cleaning: standard  Skin closure: 3 hair ties secured with dermabond  Number of sutures: none  Technique: Hair apposition   Patient tolerance: Patient tolerated the procedure well with no immediate complications.  Nobie Putnam, DO [place], PGY-2    Nobie Putnam, DO 05/07/14 [phone]

## 2014-05-07 NOTE — ED Notes (Signed)
Pt given sandwhich, approved per dr Cheri Guppy, pt sat up in bed, pt had apneic episode and sats dropped to 80, pt had circum oral cyanosis, laid flat and repositioned and pt recovered from epsidoe, instructed not to eat at this time and pt comfortable at this time, vss.

## 2014-05-07 NOTE — H&P (Signed)
History and Physical  Adrienne [name] HYI:[id number] DOB: Aug 31, 1924 DOA: 05/06/2014   PCP: Thressa Sheller, MD   Chief Complaint: Oxygen desaturation  HPI:  80 year old female with a history of COPD on nighttime oxygen (2L), diabetes mellitus, and hypertension presented to the emergency department on the evening of 05/06/2014 after a mechanical fall. The patient was trying to get something to eat in the kitchen when she slipped and fell while using her walker. She was unable to get up because of pain. There was no syncope. The patient states that she was awake, but ultimately fell asleep on the floor because she could not get up. When her family returned home approximately 3 hours later, the patient was found sleeping, but aroused easily and was in her normal mental state. This history supplemented by the patient's daughter. However, the patient was noted to have blood around her scalp. EMS was activated. The patient has been in her usual state of health without any new complaints. She denied any fevers, chills, chest pain, worsening shortness breath, nausea, vomiting, diarrhea, dysuria. There's been no headaches, focal extremity weakness. In the emergency department, the patient was given 2 separate doses of fentanyl IV. Approximately one to 2 hours after the second dose, the patient was sitting trying to eat a sandwich when she had an apneic episode with oxygen saturation dropping to the 80s. Apparently this was witnessed by the patient's daughter during the time. The patient's daughter stated that she was somnolent for a brief period but never lost consciousness. The patient was repositioned, and her oxygen saturation ultimately improved spontaneously without any other interventions. At the time of my assessment, the patient's oxygen saturation was 97% on 2 L, and the patient did not complain of any shortness of breath or chest discomfort. WBC was noted to be 16.2. BMP was unremarkable. CPK  was 110. EKG shows sinus rhythm without any ST changes. Assessment/Plan: Acute on chronic respiratory -Suspect hypoventilation due to the patient's IV opioids that were given for her pain -Spontaneously improved after repositioning without additional intervention -Patient is normally on 2 L nasal cannula at nighttime at home -Maintained on 2 L for now- -continue home maintenance regimen of albuterol nebulizer every 6 hours and Pulmicort twice a day   leukocytosis -The patient is afebrile and hemodynamically stable -Likely due to the patient's acute medical illness and recent injury from her fall -will not start abx at this time -continue to monitor -UA and CXR are unremarkable -Recheck CBC in the morning COPD -As discussed above -Continue nebulizers -continue Spiriva  Hypertension -Hold Lotensin for now -Continue metoprolol succinate and Calan acute back pain  -X-rays of thoracic, lumbar spine, and pelvis are negative for any fracture or dislocation -Will use oral opioids only  -When necessary muscle relaxer  -PT evaluation  Hyperglycemia/impaired glucose tolerance -Given the patient's age and hemoglobin A1c of 6.1, I will defer any CBG checks or NovoLog insulin at this time -She is not on any home hypoglycemic agents      Past Medical History  Diagnosis Date  . COPD (chronic obstructive pulmonary disease)   . Arthritis   . Cancer   . Breast cancer     right  . Hypertension   . AKI (acute kidney injury) 10/01/2013  . Shortness of breath   . Diabetes mellitus without complication     TYPE 2   Past Surgical History  Procedure Laterality Date  . Laparoscopic hysterectomy    . Appendectomy    .  Laparoscopic cholecystectomy    . Breast surgery Right lumpectomy   Social History:  reports that she quit smoking about 34 years ago. Her smoking use included Cigarettes. She has a 60 pack-year smoking history. She has never used smokeless tobacco. She reports that she does  not drink alcohol or use illicit drugs.   Family History  Problem Relation Age of Onset  . Lung cancer Father   . Heart disease Brother      Allergies  Allergen Reactions  . Tetanus Toxoids Swelling  . Latex Rash  . Penicillins Rash      Prior to Admission medications   Medication Sig Start Date End Date Taking? Authorizing Provider  albuterol (PROVENTIL HFA;VENTOLIN HFA) 108 (90 BASE) MCG/ACT inhaler Inhale 2 puffs into the lungs every 6 (six) hours as needed for wheezing or shortness of breath. 10/04/13   Modena Jansky, MD  albuterol (PROVENTIL) (2.5 MG/3ML) 0.083% nebulizer solution Take 2.5 mg by nebulization every 6 (six) hours.    Historical Provider, MD  aspirin EC 81 MG tablet Take 81 mg by mouth every evening.    Historical Provider, MD  benazepril (LOTENSIN) 40 MG tablet Take 40 mg by mouth daily.    Historical Provider, MD  budesonide (PULMICORT) 0.5 MG/2ML nebulizer solution Take 0.5 mg by nebulization 2 (two) times daily.    Historical Provider, MD  CALCIUM PO Take 1 tablet by mouth daily.     Historical Provider, MD  Cholecalciferol (VITAMIN D PO) Take 1 capsule by mouth every evening.    Historical Provider, MD  Coenzyme Q10 (COQ10 PO) Take 10 mLs by mouth daily.    Historical Provider, MD  furosemide (LASIX) 40 MG tablet Take 40 mg by mouth 2 (two) times daily.     Historical Provider, MD  Ketotifen Fumarate (ALAWAY OP) Apply 1 drop to eye 2 (two) times daily as needed. Both eyes For itchy eyes    Historical Provider, MD  Magnesium 250 MG TABS Take 1 tablet by mouth daily.    Historical Provider, MD  metoprolol succinate (TOPROL-XL) 50 MG 24 hr tablet Take 25 mg by mouth daily. Take with or immediately following a meal.    Historical Provider, MD  Multiple Vitamin (MULTIVITAMIN WITH MINERALS) TABS Take 1 tablet by mouth every evening.    Historical Provider, MD  Multiple Vitamins-Minerals (PRESERVISION AREDS) CAPS Take 1 capsule by mouth 2 (two) times daily.     Historical Provider, MD  Omega-3 Fatty Acids (FISH OIL PO) Take 1 capsule by mouth every evening.    Historical Provider, MD  potassium chloride (K-DUR,KLOR-CON) 10 MEQ tablet Take 10 mEq by mouth daily.    Historical Provider, MD  sertraline (ZOLOFT) 50 MG tablet Take 50 mg by mouth daily.  10/07/13   Historical Provider, MD  tiotropium (SPIRIVA HANDIHALER) 18 MCG inhalation capsule Place 1 capsule (18 mcg total) into inhaler and inhale daily. 10/04/13   Modena Jansky, MD  verapamil (CALAN) 120 MG tablet Take 60 mg by mouth 2 (two) times daily.     Historical Provider, MD    Review of Systems:  Constitutional:  No weight loss, night sweats, Fevers, chills, fatigue.  Head&Eyes: No headache.  No vision loss.  No eye pain or scotoma ENT:  No Difficulty swallowing,Tooth/dental problems,Sore throat,  No ear ache, post nasal drip,  Cardio-vascular:  No chest pain, Orthopnea, PND, swelling in lower extremities,  dizziness, palpitations  GI:  No  abdominal pain, nausea, vomiting, diarrhea, loss of  appetite, hematochezia, melena, heartburn, indigestion, Resp:  No cough. No coughing up of blood .No wheezing.No chest wall deformity  Skin:  no rash or lesions.  GU:  no dysuria, change in color of urine, no urgency or frequency. No flank pain.  Musculoskeletal:  No joint pain or swelling. No decreased range of motion. Psych:  No change in mood or affect. Neurologic: No headache, no dysesthesia, no focal weakness, no vision loss. No syncope  Physical Exam: Filed Vitals:   05/07/14 0155 05/07/14 0340 05/07/14 0555 05/07/14 0816  BP: 132/64 152/84 134/54 146/79  Pulse: 80 79 88   Temp:  98.7 F (37.1 C)    TempSrc:  Oral    Resp: 18 18 18 24   SpO2: 95% 98% 98% 95%   General:  A&O x 3, NAD, nontoxic, pleasant/cooperative Head/Eye: No conjunctival hemorrhage, no icterus, [place]/AT, No nystagmus ENT:  No icterus,  No thrush, good dentition, no pharyngeal exudate Neck:  No masses, no  lymphadenpathy, no bruits CV:  RRR, no rub, no gallop, no S3 Lung:  Diminished breath sounds bilateral, but CTAB, good air movement, no wheeze, no rhonchi Abdomen: soft/NT, +BS, nondistended, no peritoneal signs Ext: No cyanosis, No rashes, No petechiae, No lymphangitis, No edema Neuro: CNII-XII intact, strength 4-/5 in bilateral lower extremities, strength 4/5 in bilateral upper extremities, no dysmetria  Labs on Admission:  Basic Metabolic Panel:  Recent Labs Lab 05/07/14 0140  NA 141  K 4.1  CL 99  CO2 28  GLUCOSE 123*  BUN 24*  CREATININE 1.04  CALCIUM 9.4   Liver Function Tests: No results found for this basename: AST, ALT, ALKPHOS, BILITOT, PROT, ALBUMIN,  in the last 168 hours No results found for this basename: LIPASE, AMYLASE,  in the last 168 hours No results found for this basename: AMMONIA,  in the last 168 hours CBC:  Recent Labs Lab 05/07/14 0140  WBC 16.2*  NEUTROABS 13.3*  HGB 12.2  HCT 38.0  MCV 87.6  PLT 186   Cardiac Enzymes:  Recent Labs Lab 05/07/14 0140  CKTOTAL 110   BNP: No components found with this basename: POCBNP,  CBG:  Recent Labs Lab 05/07/14 0140  GLUCAP 122*    Radiological Exams on Admission: Dg Thoracic Spine W/swimmers  05/07/2014   CLINICAL DATA:  80 year old female status post fall with pain. Initial encounter.  EXAM: THORACIC SPINE - 2 VIEW + SWIMMERS  COMPARISON:  Chest CTA 10/01/2013.  FINDINGS: Osteopenia. Calcified atherosclerosis of the aorta. Normal thoracic segmentation. Stable mild superior in plain deformity at T7, chronic. Stable thoracic vertebral height and alignment. Cervicothoracic junction alignment is within normal limits. Grossly intact visualized upper lumbar levels.  IMPRESSION: No acute fracture or listhesis identified in the thoracic spine.   Electronically Signed   By: [name]   On: 05/07/2014 02:53   Dg Lumbar Spine 2-3 Views  05/07/2014   CLINICAL DATA:  80 year old female with pain after  fall. Initial encounter.  EXAM: LUMBAR SPINE - 2-3 VIEW  COMPARISON:  Thoracic series from the same day reported separately. CT Abdomen and Pelvis 10/14/2013.  FINDINGS: Normal lumbar segmentation. Stable grade 1 anterolisthesis at L4-L5 with posterior interspinous spacer device at that level. Stable lumbar vertebral height and alignment elsewhere. Lower lumbar posterior element fusion. Aortoiliac calcified atherosclerosis noted. Stable right upper quadrant surgical clips. Sacrum appears stable.  IMPRESSION: No acute fracture or listhesis identified in the lumbar spine.   Electronically Signed   By: Lezlie Octave.D.  On: 05/07/2014 02:55   Ct Head Wo Contrast  05/07/2014   CLINICAL DATA:  80 year old female with falls. Pain. Initial encounter.  EXAM: CT HEAD WITHOUT CONTRAST  CT CERVICAL SPINE WITHOUT CONTRAST  TECHNIQUE: Multidetector CT imaging of the head and cervical spine was performed following the standard protocol without intravenous contrast. Multiplanar CT image reconstructions of the cervical spine were also generated.  COMPARISON:  None.  FINDINGS: CT HEAD FINDINGS  Visualized paranasal sinuses and mastoids are clear. Calcified atherosclerosis at the skull base. No acute orbits soft tissue abnormality. Right lateral scalp laceration and hematoma, the latter measuring up to 9 mm in thickness. Underlying calvarium intact. No other scalp soft tissue abnormality identified.  Hyperdense rounded extra-axial mass in the left posterior fossa measuring 21 mm diameter compatible with meningioma. Mild mass effect on the left cerebellar hemisphere, no cerebellar edema CT.  Patchy and confluent cerebral white matter hypodensity. Cerebral volume is within normal limits for age. No ventriculomegaly. No midline shift. Basilar cisterns are patent. No acute intracranial hemorrhage identified. No evidence of cortically based acute infarction identified.  CT CERVICAL SPINE FINDINGS  Straightening of cervical lordosis.  Visualized skull base is intact. No atlanto-occipital dissociation. chronic disc and endplate degeneration J0-K9 through C6-C7. Trace anterolisthesis at C4-C5, with associated moderate facet hypertrophy greater on the right. Multifactorial spinal stenosis maximal at C5-C6. Cervicothoracic junction alignment is within normal limits. Bilateral posterior element alignment is within normal limits. No acute cervical spine fracture identified.  Apical lung scarring. Widespread calcified atherosclerosis. Retropharyngeal course of both carotids. Grossly intact visible upper thoracic levels.  IMPRESSION: 1. Right scalp soft tissue injury without underlying fracture. 2. No acute intracranial abnormality. Left cerebellar meningioma, 21 mm diameter. 3. No acute fracture or listhesis identified in the cervical spine. Ligamentous injury is not excluded. 4. Degenerative changes with spinal stenosis maximal at C5-C6.   Electronically Signed   By: [name]   On: 05/07/2014 00:47   Ct Cervical Spine Wo Contrast  05/07/2014   CLINICAL DATA:  80 year old female with falls. Pain. Initial encounter.  EXAM: CT HEAD WITHOUT CONTRAST  CT CERVICAL SPINE WITHOUT CONTRAST  TECHNIQUE: Multidetector CT imaging of the head and cervical spine was performed following the standard protocol without intravenous contrast. Multiplanar CT image reconstructions of the cervical spine were also generated.  COMPARISON:  None.  FINDINGS: CT HEAD FINDINGS  Visualized paranasal sinuses and mastoids are clear. Calcified atherosclerosis at the skull base. No acute orbits soft tissue abnormality. Right lateral scalp laceration and hematoma, the latter measuring up to 9 mm in thickness. Underlying calvarium intact. No other scalp soft tissue abnormality identified.  Hyperdense rounded extra-axial mass in the left posterior fossa measuring 21 mm diameter compatible with meningioma. Mild mass effect on the left cerebellar hemisphere, no cerebellar edema CT.   Patchy and confluent cerebral white matter hypodensity. Cerebral volume is within normal limits for age. No ventriculomegaly. No midline shift. Basilar cisterns are patent. No acute intracranial hemorrhage identified. No evidence of cortically based acute infarction identified.  CT CERVICAL SPINE FINDINGS  Straightening of cervical lordosis. Visualized skull base is intact. No atlanto-occipital dissociation. chronic disc and endplate degeneration F8-H8 through C6-C7. Trace anterolisthesis at C4-C5, with associated moderate facet hypertrophy greater on the right. Multifactorial spinal stenosis maximal at C5-C6. Cervicothoracic junction alignment is within normal limits. Bilateral posterior element alignment is within normal limits. No acute cervical spine fracture identified.  Apical lung scarring. Widespread calcified atherosclerosis. Retropharyngeal course of both carotids. Grossly  intact visible upper thoracic levels.  IMPRESSION: 1. Right scalp soft tissue injury without underlying fracture. 2. No acute intracranial abnormality. Left cerebellar meningioma, 21 mm diameter. 3. No acute fracture or listhesis identified in the cervical spine. Ligamentous injury is not excluded. 4. Degenerative changes with spinal stenosis maximal at C5-C6.   Electronically Signed   By: [name]   On: 05/07/2014 00:47   Dg Pelvis Portable  05/07/2014   CLINICAL DATA:  Multiple falls.  Back pain.  EXAM: PORTABLE PELVIS 1-2 VIEWS  COMPARISON:  None.  FINDINGS: There is no evidence of pelvic fracture or diastasis. No other pelvic bone lesions are seen.  L4-5 posterior element fixation.  IMPRESSION: No acute osseous findings.   Electronically Signed   By: Jorje Guild M.D.   On: 05/07/2014 01:22   Dg Chest Port 1 View  05/07/2014   CLINICAL DATA:  80 year old female status post fall. Initial encounter.  EXAM: PORTABLE CHEST - 1 VIEW  COMPARISON:  10/01/2013 and earlier.  FINDINGS: Portable AP view at 0100 hrs. Stable lung  volumes. No pneumothorax, pulmonary edema, pleural effusion or consolidation. Stable cardiac size and mediastinal contours. Visualized tracheal air column is within normal limits. Stable surgical clip right chest wall.  IMPRESSION: No acute cardiopulmonary abnormality.   Electronically Signed   By: [name]   On: 05/07/2014 01:24    EKG: Independently reviewed. Sinus, no ST-T    Time spent:60 minutes Code Status:   FULL Family Communication:   Daughter updated on phone   Allyiah Gartner, DO  [facility] Pager [phone]  If 7PM-7AM, please contact night-coverage [email or web address] Password [facility] 05/07/2014, 8:23 AM

## 2014-05-07 NOTE — ED Notes (Signed)
Pt family questionging delays, placed back on oxygen.

## 2014-05-07 NOTE — ED Notes (Signed)
Pain med given 

## 2014-05-08 DIAGNOSIS — W19XXXA Unspecified fall, initial encounter: Secondary | ICD-10-CM

## 2014-05-08 DIAGNOSIS — J9600 Acute respiratory failure, unspecified whether with hypoxia or hypercapnia: Secondary | ICD-10-CM

## 2014-05-08 DIAGNOSIS — J9620 Acute and chronic respiratory failure, unspecified whether with hypoxia or hypercapnia: Secondary | ICD-10-CM | POA: Diagnosis not present

## 2014-05-08 LAB — BASIC METABOLIC PANEL
ANION GAP: 11 (ref 5–15)
BUN: 29 mg/dL — ABNORMAL HIGH (ref 6–23)
CALCIUM: 9.5 mg/dL (ref 8.4–10.5)
CO2: 29 mEq/L (ref 19–32)
CREATININE: 1.04 mg/dL (ref 0.50–1.10)
Chloride: 101 mEq/L (ref 96–112)
GFR calc non Af Amer: 47 mL/min — ABNORMAL LOW (ref >90)
GFR, EST AFRICAN AMERICAN: 54 mL/min — AB (ref >90)
Glucose, Bld: 111 mg/dL — ABNORMAL HIGH (ref 70–99)
Potassium: 3.9 mEq/L (ref 3.7–5.3)
Sodium: 141 mEq/L (ref 137–147)

## 2014-05-08 LAB — CBC
HCT: 32.7 % — ABNORMAL LOW (ref 36.0–46.0)
Hemoglobin: 10.5 g/dL — ABNORMAL LOW (ref 12.0–15.0)
MCH: 28.6 pg (ref 26.0–34.0)
MCHC: 32.1 g/dL (ref 30.0–36.0)
MCV: 89.1 fL (ref 78.0–100.0)
PLATELETS: 180 10*3/uL (ref 150–400)
RBC: 3.67 MIL/uL — ABNORMAL LOW (ref 3.87–5.11)
RDW: 19.6 % — ABNORMAL HIGH (ref 11.5–15.5)
WBC: 10 10*3/uL (ref 4.0–10.5)

## 2014-05-08 MED ORDER — OXYCODONE HCL 5 MG PO TABS: 5.0000 mg | ORAL_TABLET | ORAL | Status: DC | PRN

## 2014-05-08 NOTE — Plan of Care (Signed)
Problem: Phase I Progression Outcomes Goal: O2 sats > or equal 90% or at baseline Outcome: Adequate for Discharge zoxygen saturation 94% on 2L.

## 2014-05-08 NOTE — Progress Notes (Signed)
[facility] PROGRESS NOTE  Assessment/Plan: Acute on chronic respiratory failure: -  Due to opioids. - resolved with stimulation. - have been sating > 90% on RA. - she had a mechanical fall, no LOC or prodromal symptoms. - no events on telemetry.  Leukocytosis  - The patient is afebrile and hemodynamically stable  - Likely due to stress de margination. - UA and CXR are unremarkable  - resolved.  COPD  - cont inhalers.  Hypertension  - Hold Lotensin for now  - Continue metoprolol succinate and Calan   Acute back pain  -X-rays of thoracic, lumbar spine, and pelvis are negative for any fracture or dislocation  -Will use oral opioids only  -When necessary muscle relaxer  -PT evaluation      Code Status: FULL  Family Communication: Daughter updated on phone    Consultants:  none  Procedures:  CT head  Ct neck  CXR  Lumbar spine  Antibiotics:  none  HPI/Subjective: No complains want to go home.  Objective: Filed Vitals:   05/07/14 2212 05/08/14 0534 05/08/14 0727 05/08/14 0730  BP: 164/88 148/47    Pulse: 99 74  74  Temp: 98.3 F (36.8 C) 98.1 F (36.7 C)    TempSrc: Oral Oral    Resp: 16 15  18   Height:  5\' 4"  (1.626 m)    Weight:  86.773 kg (191 lb 4.8 oz)    SpO2: 94% 92% 98%     Intake/Output Summary (Last 24 hours) at 05/08/14 1003 Last data filed at 05/08/14 0954  Gross per 24 hour  Intake    640 ml  Output      0 ml  Net    640 ml   Filed Weights   05/07/14 0940 05/07/14 0946 05/08/14 0534  Weight: 85.4 kg (188 lb 4.4 oz) 85.458 kg (188 lb 6.4 oz) 86.773 kg (191 lb 4.8 oz)    Exam:  General: Alert, awake, oriented x3, in no acute distress.  HEENT: No bruits, no goiter.  Heart: Regular rate and rhythm.  Lungs: Good air movement, clear Abdomen: Soft, nontender, nondistended, positive bowel sounds.  Neuro: Grossly intact, nonfocal.   Data Reviewed: Basic Metabolic Panel:  Recent Labs Lab 05/07/14 0140  05/08/14 0405  NA 141 141  K 4.1 3.9  CL 99 101  CO2 28 29  GLUCOSE 123* 111*  BUN 24* 29*  CREATININE 1.04 1.04  CALCIUM 9.4 9.5   Liver Function Tests: No results found for this basename: AST, ALT, ALKPHOS, BILITOT, PROT, ALBUMIN,  in the last 168 hours No results found for this basename: LIPASE, AMYLASE,  in the last 168 hours No results found for this basename: AMMONIA,  in the last 168 hours CBC:  Recent Labs Lab 05/07/14 0140 05/08/14 0405  WBC 16.2* 10.0  NEUTROABS 13.3*  --   HGB 12.2 10.5*  HCT 38.0 32.7*  MCV 87.6 89.1  PLT 186 180   Cardiac Enzymes:  Recent Labs Lab 05/07/14 0140  CKTOTAL 110   BNP (last 3 results)  Recent Labs  10/01/13 1116 12/03/13 1416  PROBNP 1521.0* 626.0*   CBG:  Recent Labs Lab 05/07/14 0140  GLUCAP 122*    No results found for this or any previous visit (from the past 240 hour(s)).   Studies: Dg Thoracic Spine W/swimmers  05/07/2014   CLINICAL DATA:  80 year old female status post fall with pain. Initial encounter.  EXAM: THORACIC SPINE - 2 VIEW + SWIMMERS  COMPARISON:  Chest  CTA 10/01/2013.  FINDINGS: Osteopenia. Calcified atherosclerosis of the aorta. Normal thoracic segmentation. Stable mild superior in plain deformity at T7, chronic. Stable thoracic vertebral height and alignment. Cervicothoracic junction alignment is within normal limits. Grossly intact visualized upper lumbar levels.  IMPRESSION: No acute fracture or listhesis identified in the thoracic spine.   Electronically Signed   By: [name]   On: 05/07/2014 02:53   Dg Lumbar Spine 2-3 Views  05/07/2014   CLINICAL DATA:  80 year old female with pain after fall. Initial encounter.  EXAM: LUMBAR SPINE - 2-3 VIEW  COMPARISON:  Thoracic series from the same day reported separately. CT Abdomen and Pelvis 10/14/2013.  FINDINGS: Normal lumbar segmentation. Stable grade 1 anterolisthesis at L4-L5 with posterior interspinous spacer device at that level. Stable  lumbar vertebral height and alignment elsewhere. Lower lumbar posterior element fusion. Aortoiliac calcified atherosclerosis noted. Stable right upper quadrant surgical clips. Sacrum appears stable.  IMPRESSION: No acute fracture or listhesis identified in the lumbar spine.   Electronically Signed   By: [name]   On: 05/07/2014 02:55   Ct Head Wo Contrast  05/07/2014   CLINICAL DATA:  80 year old female with falls. Pain. Initial encounter.  EXAM: CT HEAD WITHOUT CONTRAST  CT CERVICAL SPINE WITHOUT CONTRAST  TECHNIQUE: Multidetector CT imaging of the head and cervical spine was performed following the standard protocol without intravenous contrast. Multiplanar CT image reconstructions of the cervical spine were also generated.  COMPARISON:  None.  FINDINGS: CT HEAD FINDINGS  Visualized paranasal sinuses and mastoids are clear. Calcified atherosclerosis at the skull base. No acute orbits soft tissue abnormality. Right lateral scalp laceration and hematoma, the latter measuring up to 9 mm in thickness. Underlying calvarium intact. No other scalp soft tissue abnormality identified.  Hyperdense rounded extra-axial mass in the left posterior fossa measuring 21 mm diameter compatible with meningioma. Mild mass effect on the left cerebellar hemisphere, no cerebellar edema CT.  Patchy and confluent cerebral white matter hypodensity. Cerebral volume is within normal limits for age. No ventriculomegaly. No midline shift. Basilar cisterns are patent. No acute intracranial hemorrhage identified. No evidence of cortically based acute infarction identified.  CT CERVICAL SPINE FINDINGS  Straightening of cervical lordosis. Visualized skull base is intact. No atlanto-occipital dissociation. chronic disc and endplate degeneration Z6-X0 through C6-C7. Trace anterolisthesis at C4-C5, with associated moderate facet hypertrophy greater on the right. Multifactorial spinal stenosis maximal at C5-C6. Cervicothoracic junction  alignment is within normal limits. Bilateral posterior element alignment is within normal limits. No acute cervical spine fracture identified.  Apical lung scarring. Widespread calcified atherosclerosis. Retropharyngeal course of both carotids. Grossly intact visible upper thoracic levels.  IMPRESSION: 1. Right scalp soft tissue injury without underlying fracture. 2. No acute intracranial abnormality. Left cerebellar meningioma, 21 mm diameter. 3. No acute fracture or listhesis identified in the cervical spine. Ligamentous injury is not excluded. 4. Degenerative changes with spinal stenosis maximal at C5-C6.   Electronically Signed   By: [name]   On: 05/07/2014 00:47   Ct Cervical Spine Wo Contrast  05/07/2014   CLINICAL DATA:  80 year old female with falls. Pain. Initial encounter.  EXAM: CT HEAD WITHOUT CONTRAST  CT CERVICAL SPINE WITHOUT CONTRAST  TECHNIQUE: Multidetector CT imaging of the head and cervical spine was performed following the standard protocol without intravenous contrast. Multiplanar CT image reconstructions of the cervical spine were also generated.  COMPARISON:  None.  FINDINGS: CT HEAD FINDINGS  Visualized paranasal sinuses and mastoids are clear.  Calcified atherosclerosis at the skull base. No acute orbits soft tissue abnormality. Right lateral scalp laceration and hematoma, the latter measuring up to 9 mm in thickness. Underlying calvarium intact. No other scalp soft tissue abnormality identified.  Hyperdense rounded extra-axial mass in the left posterior fossa measuring 21 mm diameter compatible with meningioma. Mild mass effect on the left cerebellar hemisphere, no cerebellar edema CT.  Patchy and confluent cerebral white matter hypodensity. Cerebral volume is within normal limits for age. No ventriculomegaly. No midline shift. Basilar cisterns are patent. No acute intracranial hemorrhage identified. No evidence of cortically based acute infarction identified.  CT CERVICAL SPINE  FINDINGS  Straightening of cervical lordosis. Visualized skull base is intact. No atlanto-occipital dissociation. chronic disc and endplate degeneration G5-O0 through C6-C7. Trace anterolisthesis at C4-C5, with associated moderate facet hypertrophy greater on the right. Multifactorial spinal stenosis maximal at C5-C6. Cervicothoracic junction alignment is within normal limits. Bilateral posterior element alignment is within normal limits. No acute cervical spine fracture identified.  Apical lung scarring. Widespread calcified atherosclerosis. Retropharyngeal course of both carotids. Grossly intact visible upper thoracic levels.  IMPRESSION: 1. Right scalp soft tissue injury without underlying fracture. 2. No acute intracranial abnormality. Left cerebellar meningioma, 21 mm diameter. 3. No acute fracture or listhesis identified in the cervical spine. Ligamentous injury is not excluded. 4. Degenerative changes with spinal stenosis maximal at C5-C6.   Electronically Signed   By: [name]   On: 05/07/2014 00:47   Dg Pelvis Portable  05/07/2014   CLINICAL DATA:  Multiple falls.  Back pain.  EXAM: PORTABLE PELVIS 1-2 VIEWS  COMPARISON:  None.  FINDINGS: There is no evidence of pelvic fracture or diastasis. No other pelvic bone lesions are seen.  L4-5 posterior element fixation.  IMPRESSION: No acute osseous findings.   Electronically Signed   By: Jorje Guild M.D.   On: 05/07/2014 01:22   Dg Chest Port 1 View  05/07/2014   CLINICAL DATA:  80 year old female status post fall. Initial encounter.  EXAM: PORTABLE CHEST - 1 VIEW  COMPARISON:  10/01/2013 and earlier.  FINDINGS: Portable AP view at 0100 hrs. Stable lung volumes. No pneumothorax, pulmonary edema, pleural effusion or consolidation. Stable cardiac size and mediastinal contours. Visualized tracheal air column is within normal limits. Stable surgical clip right chest wall.  IMPRESSION: No acute cardiopulmonary abnormality.   Electronically Signed   By:  [name]   On: 05/07/2014 01:24    Scheduled Meds: . albuterol  2.5 mg Nebulization Q6H  . aspirin EC  81 mg Oral QPM  . budesonide  0.5 mg Nebulization BID  . furosemide  40 mg Oral BID  . heparin  5,000 Units Subcutaneous 3 times per day  . magnesium oxide  200 mg Oral Daily  . metoprolol succinate  25 mg Oral Daily  . multivitamin with minerals  1 tablet Oral QPM  . multivitamin-lutein  1 capsule Oral BID  . omega-3 acid ethyl esters  1 g Oral Daily  . potassium chloride  10 mEq Oral Daily  . sertraline  50 mg Oral Daily  . sodium chloride  3 mL Intravenous Q12H  . tiotropium  18 mcg Inhalation Daily  . verapamil  60 mg Oral BID   Continuous Infusions:    Charlynne Cousins  [facility] Pager [phone]. If 8PM-8AM, please contact night-coverage at [email or web address], password [facility] 05/08/2014, 10:03 AM  LOS: 2 days      **Disclaimer: This note may have been dictated  with voice recognition software. Similar sounding words can inadvertently be transcribed and this note may contain transcription errors which may not have been corrected upon publication of note.**

## 2014-05-08 NOTE — Progress Notes (Signed)
Spoke with client's daughter on phone regarding suture care until removal on 8/28.  Discharge instructions gone over with son-in-law with which she stays.  He verbalized understanding.

## 2014-05-08 NOTE — Progress Notes (Signed)
Utilization Review Completed.   Pheonix Clinkscale, RN, BSN Nurse Case Manager  

## 2014-05-08 NOTE — Progress Notes (Signed)
Patient is sleeping peaceful;ly at present time. Denies/complaint of pain/discomfort. Will continue to monitor.  Esperanza Heir, RN

## 2014-05-08 NOTE — Discharge Summary (Signed)
Physician Discharge Summary  Adrienne [name] DXA:[id number] DOB: 1924-09-13 DOA: 05/06/2014  PCP: Thressa Sheller, MD  Admit date: 05/06/2014 Discharge date: 05/08/2014  Time spent: 35 minutes  Recommendations for Outpatient Follow-up:  1. Follow up with PCP.  Discharge Diagnoses:  Active Problems:   Acute on chronic respiratory failure   Discharge Condition: stable  Diet recommendation: heart healthy  Filed Weights   05/07/14 0940 05/07/14 0946 05/08/14 0534  Weight: 85.4 kg (188 lb 4.4 oz) 85.458 kg (188 lb 6.4 oz) 86.773 kg (191 lb 4.8 oz)    History of present illness:  80 year old female with a history of COPD on nighttime oxygen (2L), diabetes mellitus, and hypertension presented to the emergency department on the evening of 05/06/2014 after a mechanical fall. The patient was trying to get something to eat in the kitchen when she slipped and fell while using her walker. She was unable to get up because of pain. There was no syncope. The patient states that she was awake, but ultimately fell asleep on the floor because she could not get up. When her family returned home approximately 3 hours later, the patient was found sleeping, but aroused easily and was in her normal mental state. This history supplemented by the patient's daughter. However, the patient was noted to have blood around her scalp. EMS was activated. The patient has been in her usual state of health without any new complaints. She denied any fevers, chills, chest pain, worsening shortness breath, nausea, vomiting, diarrhea, dysuria. There's been no headaches, focal extremity weakness.   Hospital Course:  Acute on chronic respiratory failure:  - Due to opioids.  - resolved with stimulation. Monitor overnight with no events. - have been sating > 90% on RA.  - she had a mechanical fall, no LOC or prodromal symptoms.  - no events on telemetry.   Leukocytosis  - The patient is afebrile and hemodynamically stable   - Likely due to stress de margination.  - UA and CXR are unremarkable  - resolved.   COPD  - cont inhalers.   Hypertension  - Hold Lotensin for now  - Continue metoprolol succinate and Calan   Acute back pain  -X-rays of thoracic, lumbar spine, and pelvis are negative for any fracture or dislocation  -Will use oral opioids only  -When necessary muscle relaxer  -PT evaluation    Procedures: CT head  Ct neck  CXR  Lumbar spine   Consultations:  none  Discharge Exam: Filed Vitals:   05/08/14 0730  BP:   Pulse: 74  Temp:   Resp: 18    General: see progress note  Discharge Instructions You were cared for by a hospitalist during your hospital stay. If you have any questions about your discharge medications or the care you received while you were in the hospital after you are discharged, you can call the unit and asked to speak with the hospitalist on call if the hospitalist that took care of you is not available. Once you are discharged, your primary care physician will handle any further medical issues. Please note that NO REFILLS for any discharge medications will be authorized once you are discharged, as it is imperative that you return to your primary care physician (or establish a relationship with a primary care physician if you do not have one) for your aftercare needs so that they can reassess your need for medications and monitor your lab values.      Discharge Instructions   Diet - low  sodium heart healthy    Complete by:  As directed      Increase activity slowly    Complete by:  As directed             Medication List         ALAWAY OP  Apply 1 drop to eye 2 (two) times daily as needed. Both eyes For itchy eyes     albuterol (2.5 MG/3ML) 0.083% nebulizer solution  Commonly known as:  PROVENTIL  Take 2.5 mg by nebulization every 6 (six) hours.     albuterol 108 (90 BASE) MCG/ACT inhaler  Commonly known as:  PROVENTIL HFA;VENTOLIN HFA  Inhale 2  puffs into the lungs every 6 (six) hours as needed for wheezing or shortness of breath.     aspirin EC 81 MG tablet  Take 81 mg by mouth every evening.     benazepril 40 MG tablet  Commonly known as:  LOTENSIN  Take 40 mg by mouth daily.     budesonide 0.5 MG/2ML nebulizer solution  Commonly known as:  PULMICORT  Take 0.5 mg by nebulization 2 (two) times daily.     CALCIUM PO  Take 1 tablet by mouth daily.     COQ10 PO  Take 10 mLs by mouth daily.     FISH OIL PO  Take 1 capsule by mouth every evening.     furosemide 40 MG tablet  Commonly known as:  LASIX  Take 40 mg by mouth daily.     Magnesium 250 MG Tabs  Take 1 tablet by mouth daily.     metoprolol succinate 50 MG 24 hr tablet  Commonly known as:  TOPROL-XL  Take 25 mg by mouth daily. Take with or immediately following a meal.     multivitamin with minerals Tabs tablet  Take 1 tablet by mouth every evening.     oxyCODONE 5 MG immediate release tablet  Commonly known as:  Oxy IR/ROXICODONE  Take 1 tablet (5 mg total) by mouth every 4 (four) hours as needed for moderate pain.     potassium chloride 10 MEQ tablet  Commonly known as:  K-DUR,KLOR-CON  Take 10 mEq by mouth daily.     PRESERVISION AREDS Caps  Take 1 capsule by mouth 2 (two) times daily.     sertraline 50 MG tablet  Commonly known as:  ZOLOFT  Take 50 mg by mouth daily.     tiotropium 18 MCG inhalation capsule  Commonly known as:  SPIRIVA HANDIHALER  Place 1 capsule (18 mcg total) into inhaler and inhale daily.     verapamil 120 MG tablet  Commonly known as:  CALAN  Take 60 mg by mouth 2 (two) times daily.     VITAMIN D PO  Take 1 capsule by mouth every evening.       Allergies  Allergen Reactions  . Tetanus Toxoids Swelling  . Latex Rash  . Penicillins Rash      The results of significant diagnostics from this hospitalization (including imaging, microbiology, ancillary and laboratory) are listed below for reference.     Significant Diagnostic Studies: Dg Thoracic Spine W/swimmers  05/07/2014   CLINICAL DATA:  80 year old female status post fall with pain. Initial encounter.  EXAM: THORACIC SPINE - 2 VIEW + SWIMMERS  COMPARISON:  Chest CTA 10/01/2013.  FINDINGS: Osteopenia. Calcified atherosclerosis of the aorta. Normal thoracic segmentation. Stable mild superior in plain deformity at T7, chronic. Stable thoracic vertebral height and alignment. Cervicothoracic junction alignment  is within normal limits. Grossly intact visualized upper lumbar levels.  IMPRESSION: No acute fracture or listhesis identified in the thoracic spine.   Electronically Signed   By: [name]   On: 05/07/2014 02:53   Dg Lumbar Spine 2-3 Views  05/07/2014   CLINICAL DATA:  80 year old female with pain after fall. Initial encounter.  EXAM: LUMBAR SPINE - 2-3 VIEW  COMPARISON:  Thoracic series from the same day reported separately. CT Abdomen and Pelvis 10/14/2013.  FINDINGS: Normal lumbar segmentation. Stable grade 1 anterolisthesis at L4-L5 with posterior interspinous spacer device at that level. Stable lumbar vertebral height and alignment elsewhere. Lower lumbar posterior element fusion. Aortoiliac calcified atherosclerosis noted. Stable right upper quadrant surgical clips. Sacrum appears stable.  IMPRESSION: No acute fracture or listhesis identified in the lumbar spine.   Electronically Signed   By: [name]   On: 05/07/2014 02:55   Ct Head Wo Contrast  05/07/2014   CLINICAL DATA:  80 year old female with falls. Pain. Initial encounter.  EXAM: CT HEAD WITHOUT CONTRAST  CT CERVICAL SPINE WITHOUT CONTRAST  TECHNIQUE: Multidetector CT imaging of the head and cervical spine was performed following the standard protocol without intravenous contrast. Multiplanar CT image reconstructions of the cervical spine were also generated.  COMPARISON:  None.  FINDINGS: CT HEAD FINDINGS  Visualized paranasal sinuses and mastoids are clear. Calcified  atherosclerosis at the skull base. No acute orbits soft tissue abnormality. Right lateral scalp laceration and hematoma, the latter measuring up to 9 mm in thickness. Underlying calvarium intact. No other scalp soft tissue abnormality identified.  Hyperdense rounded extra-axial mass in the left posterior fossa measuring 21 mm diameter compatible with meningioma. Mild mass effect on the left cerebellar hemisphere, no cerebellar edema CT.  Patchy and confluent cerebral white matter hypodensity. Cerebral volume is within normal limits for age. No ventriculomegaly. No midline shift. Basilar cisterns are patent. No acute intracranial hemorrhage identified. No evidence of cortically based acute infarction identified.  CT CERVICAL SPINE FINDINGS  Straightening of cervical lordosis. Visualized skull base is intact. No atlanto-occipital dissociation. chronic disc and endplate degeneration T2-I7 through C6-C7. Trace anterolisthesis at C4-C5, with associated moderate facet hypertrophy greater on the right. Multifactorial spinal stenosis maximal at C5-C6. Cervicothoracic junction alignment is within normal limits. Bilateral posterior element alignment is within normal limits. No acute cervical spine fracture identified.  Apical lung scarring. Widespread calcified atherosclerosis. Retropharyngeal course of both carotids. Grossly intact visible upper thoracic levels.  IMPRESSION: 1. Right scalp soft tissue injury without underlying fracture. 2. No acute intracranial abnormality. Left cerebellar meningioma, 21 mm diameter. 3. No acute fracture or listhesis identified in the cervical spine. Ligamentous injury is not excluded. 4. Degenerative changes with spinal stenosis maximal at C5-C6.   Electronically Signed   By: [name]   On: 05/07/2014 00:47   Ct Cervical Spine Wo Contrast  05/07/2014   CLINICAL DATA:  80 year old female with falls. Pain. Initial encounter.  EXAM: CT HEAD WITHOUT CONTRAST  CT CERVICAL SPINE WITHOUT  CONTRAST  TECHNIQUE: Multidetector CT imaging of the head and cervical spine was performed following the standard protocol without intravenous contrast. Multiplanar CT image reconstructions of the cervical spine were also generated.  COMPARISON:  None.  FINDINGS: CT HEAD FINDINGS  Visualized paranasal sinuses and mastoids are clear. Calcified atherosclerosis at the skull base. No acute orbits soft tissue abnormality. Right lateral scalp laceration and hematoma, the latter measuring up to 9 mm in thickness. Underlying calvarium intact. No  other scalp soft tissue abnormality identified.  Hyperdense rounded extra-axial mass in the left posterior fossa measuring 21 mm diameter compatible with meningioma. Mild mass effect on the left cerebellar hemisphere, no cerebellar edema CT.  Patchy and confluent cerebral white matter hypodensity. Cerebral volume is within normal limits for age. No ventriculomegaly. No midline shift. Basilar cisterns are patent. No acute intracranial hemorrhage identified. No evidence of cortically based acute infarction identified.  CT CERVICAL SPINE FINDINGS  Straightening of cervical lordosis. Visualized skull base is intact. No atlanto-occipital dissociation. chronic disc and endplate degeneration H8-I6 through C6-C7. Trace anterolisthesis at C4-C5, with associated moderate facet hypertrophy greater on the right. Multifactorial spinal stenosis maximal at C5-C6. Cervicothoracic junction alignment is within normal limits. Bilateral posterior element alignment is within normal limits. No acute cervical spine fracture identified.  Apical lung scarring. Widespread calcified atherosclerosis. Retropharyngeal course of both carotids. Grossly intact visible upper thoracic levels.  IMPRESSION: 1. Right scalp soft tissue injury without underlying fracture. 2. No acute intracranial abnormality. Left cerebellar meningioma, 21 mm diameter. 3. No acute fracture or listhesis identified in the cervical spine.  Ligamentous injury is not excluded. 4. Degenerative changes with spinal stenosis maximal at C5-C6.   Electronically Signed   By: [name]   On: 05/07/2014 00:47   Dg Pelvis Portable  05/07/2014   CLINICAL DATA:  Multiple falls.  Back pain.  EXAM: PORTABLE PELVIS 1-2 VIEWS  COMPARISON:  None.  FINDINGS: There is no evidence of pelvic fracture or diastasis. No other pelvic bone lesions are seen.  L4-5 posterior element fixation.  IMPRESSION: No acute osseous findings.   Electronically Signed   By: Jorje Guild M.D.   On: 05/07/2014 01:22   Dg Chest Port 1 View  05/07/2014   CLINICAL DATA:  80 year old female status post fall. Initial encounter.  EXAM: PORTABLE CHEST - 1 VIEW  COMPARISON:  10/01/2013 and earlier.  FINDINGS: Portable AP view at 0100 hrs. Stable lung volumes. No pneumothorax, pulmonary edema, pleural effusion or consolidation. Stable cardiac size and mediastinal contours. Visualized tracheal air column is within normal limits. Stable surgical clip right chest wall.  IMPRESSION: No acute cardiopulmonary abnormality.   Electronically Signed   By: [name]   On: 05/07/2014 01:24    Microbiology: No results found for this or any previous visit (from the past 240 hour(s)).   Labs: Basic Metabolic Panel:  Recent Labs Lab 05/07/14 0140 05/08/14 0405  NA 141 141  K 4.1 3.9  CL 99 101  CO2 28 29  GLUCOSE 123* 111*  BUN 24* 29*  CREATININE 1.04 1.04  CALCIUM 9.4 9.5   Liver Function Tests: No results found for this basename: AST, ALT, ALKPHOS, BILITOT, PROT, ALBUMIN,  in the last 168 hours No results found for this basename: LIPASE, AMYLASE,  in the last 168 hours No results found for this basename: AMMONIA,  in the last 168 hours CBC:  Recent Labs Lab 05/07/14 0140 05/08/14 0405  WBC 16.2* 10.0  NEUTROABS 13.3*  --   HGB 12.2 10.5*  HCT 38.0 32.7*  MCV 87.6 89.1  PLT 186 180   Cardiac Enzymes:  Recent Labs Lab 05/07/14 0140  CKTOTAL 110    BNP: BNP (last 3 results)  Recent Labs  10/01/13 1116 12/03/13 1416  PROBNP 1521.0* 626.0*   CBG:  Recent Labs Lab 05/07/14 0140  GLUCAP 122*     Signed:  FELIZ ORTIZ, ABRAHAM  [facility] 05/08/2014, 10:16 AM

## 2014-05-10 NOTE — Progress Notes (Signed)
05/07/14 1609  PT Time Calculation  PT Start Time 1535  PT Stop Time 1550  PT Time Calculation (min) 15 min  PT G-Codes **NOT FOR INPATIENT CLASS**  Functional Assessment Tool Used clinical judgement  Functional Limitation Mobility: Walking and moving around  Mobility: Walking and Moving Around Current Status (H9622) CJ  Mobility: Walking and Moving Around Goal Status (W9798) CI  PT General Charges  $$ ACUTE PT VISIT 1 Procedure  PT Evaluation  $Initial PT Evaluation Tier I 1 Procedure  PT Treatments  $Gait Training 8-22 mins  [facility] PT [phone]

## 2014-05-16 ENCOUNTER — Encounter (HOSPITAL_COMMUNITY): Payer: Self-pay | Admitting: Emergency Medicine

## 2014-05-16 ENCOUNTER — Emergency Department (INDEPENDENT_AMBULATORY_CARE_PROVIDER_SITE_OTHER)
Admission: EM | Admit: 2014-05-16 | Discharge: 2014-05-16 | Disposition: A | Discharge status: Home / Self Care | Payer: Medicare Other | Source: Home / Self Care | Attending: Family Medicine | Admitting: Family Medicine

## 2014-05-16 DIAGNOSIS — Z5189 Encounter for other specified aftercare: Secondary | ICD-10-CM

## 2014-05-16 MED ORDER — BACITRACIN ZINC 500 UNIT/GM EX OINT
TOPICAL_OINTMENT | CUTANEOUS | Status: AC
  Filled 2014-05-16: qty 1.8

## 2014-05-16 NOTE — ED Notes (Signed)
Here  For  A  Wound  Check    Wound  Appears   Healing

## 2014-05-16 NOTE — Discharge Instructions (Signed)
Use bacitracin 1-2 times a day for 3-5 days.

## 2014-05-16 NOTE — ED Provider Notes (Signed)
CSN: [id number]     Arrival date & time 05/16/14  1523 History   First MD Initiated Contact with Patient 05/16/14 1553     Chief Complaint  Patient presents with  . Wound Check   (Consider location/radiation/quality/duration/timing/severity/associated sxs/prior Treatment) Patient is a 80 y.o. female presenting with wound check. The history is provided by the patient and a relative.  Wound Check This is a new problem. Episode onset: seen 8/22 for scalp lac,no problems, here for presumed staple removal. The problem has been rapidly improving.    Past Medical History  Diagnosis Date  . COPD (chronic obstructive pulmonary disease)   . Arthritis   . Cancer   . Breast cancer     right  . Hypertension   . AKI (acute kidney injury) 10/01/2013  . Shortness of breath   . Diabetes mellitus without complication     TYPE 2   Past Surgical History  Procedure Laterality Date  . Laparoscopic hysterectomy    . Appendectomy    . Laparoscopic cholecystectomy    . Breast surgery Right lumpectomy   Family History  Problem Relation Age of Onset  . Lung cancer Father   . Heart disease Brother    History  Substance Use Topics  . Smoking status: Former Smoker -- 2.00 packs/day for 30 years    Types: Cigarettes    Quit date: 02/20/1980  . Smokeless tobacco: Never Used  . Alcohol Use: No   OB History   Grav Para Term Preterm Abortions TAB SAB Ect Mult Living                 Review of Systems  Constitutional: Negative.   Musculoskeletal: Negative.   Skin: Positive for wound.    Allergies  Tetanus toxoids; Latex; and Penicillins  Home Medications   Prior to Admission medications   Medication Sig Start Date End Date Taking? Authorizing Provider  albuterol (PROVENTIL HFA;VENTOLIN HFA) 108 (90 BASE) MCG/ACT inhaler Inhale 2 puffs into the lungs every 6 (six) hours as needed for wheezing or shortness of breath. 10/04/13   Modena Jansky, MD  albuterol (PROVENTIL) (2.5 MG/3ML) 0.083%  nebulizer solution Take 2.5 mg by nebulization every 6 (six) hours.    Historical Provider, MD  aspirin EC 81 MG tablet Take 81 mg by mouth every evening.    Historical Provider, MD  benazepril (LOTENSIN) 40 MG tablet Take 40 mg by mouth daily.    Historical Provider, MD  budesonide (PULMICORT) 0.5 MG/2ML nebulizer solution Take 0.5 mg by nebulization 2 (two) times daily.    Historical Provider, MD  CALCIUM PO Take 1 tablet by mouth daily.     Historical Provider, MD  Cholecalciferol (VITAMIN D PO) Take 1 capsule by mouth every evening.    Historical Provider, MD  Coenzyme Q10 (COQ10 PO) Take 10 mLs by mouth daily.    Historical Provider, MD  furosemide (LASIX) 40 MG tablet Take 40 mg by mouth daily.     Historical Provider, MD  Ketotifen Fumarate (ALAWAY OP) Apply 1 drop to eye 2 (two) times daily as needed. Both eyes For itchy eyes    Historical Provider, MD  Magnesium 250 MG TABS Take 1 tablet by mouth daily.    Historical Provider, MD  metoprolol succinate (TOPROL-XL) 50 MG 24 hr tablet Take 25 mg by mouth daily. Take with or immediately following a meal.    Historical Provider, MD  Multiple Vitamin (MULTIVITAMIN WITH MINERALS) TABS Take 1 tablet by mouth every  evening.    Historical Provider, MD  Multiple Vitamins-Minerals (PRESERVISION AREDS) CAPS Take 1 capsule by mouth 2 (two) times daily.    Historical Provider, MD  Omega-3 Fatty Acids (FISH OIL PO) Take 1 capsule by mouth every evening.    Historical Provider, MD  oxyCODONE (OXY IR/ROXICODONE) 5 MG immediate release tablet Take 1 tablet (5 mg total) by mouth every 4 (four) hours as needed for moderate pain. 05/08/14   Charlynne Cousins, MD  potassium chloride (K-DUR,KLOR-CON) 10 MEQ tablet Take 10 mEq by mouth daily.    Historical Provider, MD  sertraline (ZOLOFT) 50 MG tablet Take 50 mg by mouth daily.  10/07/13   Historical Provider, MD  tiotropium (SPIRIVA HANDIHALER) 18 MCG inhalation capsule Place 1 capsule (18 mcg total) into inhaler  and inhale daily. 10/04/13   Modena Jansky, MD  verapamil (CALAN) 120 MG tablet Take 60 mg by mouth 2 (two) times daily.     Historical Provider, MD   BP 168/78  Pulse 80  Temp(Src) 99 F (37.2 C) (Oral)  Resp 18  SpO2 98% Physical Exam  Nursing note and vitals reviewed. Constitutional: She is oriented to person, place, and time. She appears well-developed and well-nourished. No distress.  HENT:  Head: Normocephalic.  Neurological: She is alert and oriented to person, place, and time.  Skin: Skin is warm and dry.  Right temporal scalp lac, no staples present , eschar debrided, bacitracin applied.well healed.    ED Course  Procedures (including critical care time) Labs Review Labs Reviewed - No data to display  Imaging Review No results found.   MDM   1. Encounter for wound re-check        Billy Fischer, MD 05/16/14 [phone]

## 2014-05-16 NOTE — ED Notes (Deleted)
Pt is  Her  For  Staple  Removal       Staples  Have  Been in for  9  Days

## 2014-05-19 ENCOUNTER — Emergency Department (HOSPITAL_COMMUNITY): Payer: Medicare Other

## 2014-05-19 ENCOUNTER — Emergency Department (HOSPITAL_COMMUNITY)
Admission: EM | Admit: 2014-05-19 | Discharge: 2014-05-19 | Disposition: A | Discharge status: Home / Self Care | Payer: Medicare Other | Attending: Emergency Medicine | Admitting: Emergency Medicine

## 2014-05-19 ENCOUNTER — Encounter (HOSPITAL_COMMUNITY): Payer: Self-pay | Admitting: Emergency Medicine

## 2014-05-19 DIAGNOSIS — R4182 Altered mental status, unspecified: Secondary | ICD-10-CM | POA: Diagnosis present

## 2014-05-19 DIAGNOSIS — I10 Essential (primary) hypertension: Secondary | ICD-10-CM | POA: Insufficient documentation

## 2014-05-19 DIAGNOSIS — Z853 Personal history of malignant neoplasm of breast: Secondary | ICD-10-CM | POA: Insufficient documentation

## 2014-05-19 DIAGNOSIS — IMO0002 Reserved for concepts with insufficient information to code with codable children: Secondary | ICD-10-CM | POA: Diagnosis not present

## 2014-05-19 DIAGNOSIS — Z87828 Personal history of other (healed) physical injury and trauma: Secondary | ICD-10-CM | POA: Insufficient documentation

## 2014-05-19 DIAGNOSIS — E119 Type 2 diabetes mellitus without complications: Secondary | ICD-10-CM | POA: Diagnosis not present

## 2014-05-19 DIAGNOSIS — Z7982 Long term (current) use of aspirin: Secondary | ICD-10-CM | POA: Insufficient documentation

## 2014-05-19 DIAGNOSIS — Z880 Allergy status to penicillin: Secondary | ICD-10-CM | POA: Diagnosis not present

## 2014-05-19 DIAGNOSIS — M129 Arthropathy, unspecified: Secondary | ICD-10-CM | POA: Insufficient documentation

## 2014-05-19 DIAGNOSIS — Z87891 Personal history of nicotine dependence: Secondary | ICD-10-CM | POA: Insufficient documentation

## 2014-05-19 DIAGNOSIS — J449 Chronic obstructive pulmonary disease, unspecified: Secondary | ICD-10-CM | POA: Insufficient documentation

## 2014-05-19 DIAGNOSIS — Z79899 Other long term (current) drug therapy: Secondary | ICD-10-CM | POA: Diagnosis not present

## 2014-05-19 DIAGNOSIS — R059 Cough, unspecified: Secondary | ICD-10-CM | POA: Insufficient documentation

## 2014-05-19 DIAGNOSIS — F0390 Unspecified dementia without behavioral disturbance: Secondary | ICD-10-CM | POA: Diagnosis not present

## 2014-05-19 DIAGNOSIS — Z91040 Latex allergy status: Secondary | ICD-10-CM | POA: Diagnosis not present

## 2014-05-19 DIAGNOSIS — J4489 Other specified chronic obstructive pulmonary disease: Secondary | ICD-10-CM | POA: Insufficient documentation

## 2014-05-19 DIAGNOSIS — Z859 Personal history of malignant neoplasm, unspecified: Secondary | ICD-10-CM | POA: Insufficient documentation

## 2014-05-19 DIAGNOSIS — R05 Cough: Secondary | ICD-10-CM | POA: Diagnosis not present

## 2014-05-19 LAB — URINALYSIS, ROUTINE W REFLEX MICROSCOPIC
BILIRUBIN URINE: NEGATIVE
Glucose, UA: NEGATIVE mg/dL
HGB URINE DIPSTICK: NEGATIVE
KETONES UR: NEGATIVE mg/dL
Nitrite: NEGATIVE
PROTEIN: NEGATIVE mg/dL
Specific Gravity, Urine: 1.017 (ref 1.005–1.030)
UROBILINOGEN UA: 1 mg/dL (ref 0.0–1.0)
pH: 6.5 (ref 5.0–8.0)

## 2014-05-19 LAB — BASIC METABOLIC PANEL
ANION GAP: 10 (ref 5–15)
BUN: 16 mg/dL (ref 6–23)
CHLORIDE: 101 meq/L (ref 96–112)
CO2: 27 mEq/L (ref 19–32)
CREATININE: 0.93 mg/dL (ref 0.50–1.10)
Calcium: 9.8 mg/dL (ref 8.4–10.5)
GFR, EST AFRICAN AMERICAN: 62 mL/min — AB (ref >90)
GFR, EST NON AFRICAN AMERICAN: 53 mL/min — AB (ref >90)
Glucose, Bld: 116 mg/dL — ABNORMAL HIGH (ref 70–99)
Potassium: 4 mEq/L (ref 3.7–5.3)
Sodium: 138 mEq/L (ref 137–147)

## 2014-05-19 LAB — CBG MONITORING, ED: Glucose-Capillary: 103 mg/dL — ABNORMAL HIGH (ref 70–99)

## 2014-05-19 LAB — I-STAT CG4 LACTIC ACID, ED: LACTIC ACID, VENOUS: 1.02 mmol/L (ref 0.5–2.2)

## 2014-05-19 LAB — CBC WITH DIFFERENTIAL/PLATELET
BASOS ABS: 0 10*3/uL (ref 0.0–0.1)
BASOS PCT: 0 % (ref 0–1)
EOS ABS: 0.3 10*3/uL (ref 0.0–0.7)
Eosinophils Relative: 3 % (ref 0–5)
HEMATOCRIT: 35.7 % — AB (ref 36.0–46.0)
Hemoglobin: 11.2 g/dL — ABNORMAL LOW (ref 12.0–15.0)
Lymphocytes Relative: 18 % (ref 12–46)
Lymphs Abs: 1.8 10*3/uL (ref 0.7–4.0)
MCH: 28.4 pg (ref 26.0–34.0)
MCHC: 31.4 g/dL (ref 30.0–36.0)
MCV: 90.4 fL (ref 78.0–100.0)
MONO ABS: 0.8 10*3/uL (ref 0.1–1.0)
Monocytes Relative: 8 % (ref 3–12)
NEUTROS ABS: 6.8 10*3/uL (ref 1.7–7.7)
Neutrophils Relative %: 71 % (ref 43–77)
Platelets: 246 10*3/uL (ref 150–400)
RBC: 3.95 MIL/uL (ref 3.87–5.11)
RDW: 18.7 % — AB (ref 11.5–15.5)
WBC: 9.7 10*3/uL (ref 4.0–10.5)

## 2014-05-19 LAB — ETHANOL

## 2014-05-19 LAB — AMMONIA: Ammonia: 24 umol/L (ref 11–60)

## 2014-05-19 LAB — URINE MICROSCOPIC-ADD ON

## 2014-05-19 LAB — TROPONIN I: Troponin I: <0.3 ng/mL (ref <0.30)

## 2014-05-19 LAB — ACETAMINOPHEN LEVEL: Acetaminophen (Tylenol), Serum: <15 ug/mL (ref 10–30)

## 2014-05-19 MED ORDER — SODIUM CHLORIDE 0.9 % IV SOLN
Freq: Once | INTRAVENOUS | Status: AC
  Administered 2014-05-19: 15:00:00 via INTRAVENOUS

## 2014-05-19 NOTE — Progress Notes (Signed)
CSW met with the patient and family at bedside to complete this assessment.  Patient was sleep when conversation began and choose to sleep most of the interview.  Daughter reports that the patient had a fall on 8/21 that resulted in some head injuries and an overnight stay at [facility].  She reports that the patient did have home health care in the past but the family was not satisfied to the service.  Daughter is reporting that the patient's mobility and stability has decreased since that last reported fall.  She reports that the patient is sleeping more, rolled out of bed 2xs in the last 2 weeks, and now just not very active as normal.  Daughter reports that she is the POA and that the patient's home is in her name for the patient protection.  Daughter and Son in law is reporting the patient's competency is questionable at time but they were unable to justify at this time her ability to make an informed decision.    CSW spoke with the patient and she agreed that she would like to go to a inpatient rehab for safety. CSW shared with the patient the options of home health services and inpatient rehabilitation.   CSW shared with the family the criteria needed according to the stipulation of medicare and the 3 day qaulifying stays and the options if she is not admitted to the hospital.  They are now in agreement with home health services if the patient can not meet criteria for rehab.  CSW also gave information on private duty services and informed RNCM to follow up.   CSW spoke with Mable Fill, PA who reports that the patient will not be admitted therefore home health services will be the better option.  CSW informed the RNCM of the need for services.     Chesley Noon, MSW, [place], 05/19/2014 Evening Clinical Social Worker [phone]

## 2014-05-19 NOTE — Progress Notes (Signed)
Asked by EDP to assess patient. As per ED patient does not meet criteria for admission and family wanted her to be admitted.  Upon entering room Case manager was already talking to family. Daughter had already reconciled that patient would have to go home.   I did not fully assess patient as family was now agreeing to ED recommendations.   Informed EDP and PA.  Dontaye Hur 6:51 PM

## 2014-05-19 NOTE — ED Notes (Signed)
Pt ambulated with walker.  Maintained a steady gait.  Pt ambulated without assist.  Pt family commented that " this is the best she has walked in 3 days.  Pt showed no signs of distress.

## 2014-05-19 NOTE — ED Provider Notes (Signed)
CSN: [id number]     Arrival date & time 05/19/14  1342 History   First MD Initiated Contact with Patient 05/19/14 1505     Chief Complaint  Patient presents with  . Altered Mental Status     (Consider location/radiation/quality/duration/timing/severity/associated sxs/prior Treatment) The history is provided by the patient. No language interpreter was used.  Adrienne [name] is an 80 y/o F with PMhx of COPD, arthritis, breast cancer, HTN, shortness of breath, DM, acute kidney injury presenting to the ED with AMS - brought in by daughter and son-in-law - with worsening over the past 2-3 days. As per daughter, reported that patient does have history of dementia, but stated that her level of mental status has changed. Daughter reported that patient is not able to remember the day, when she has eaten - stated that this is not normal for her. Reported that one morning she woke up at 3:30AM and started to cook food. Reported that the patient has been having mood swings where she is fine and then the next second she is frustrated and upset. Reported that patient rolled off the bed, stated that the mattress is on the floor surrounded with pillows, yesterday morning. Reported that the patient has been sleeping a lot of the times - stated that the patient slept the whole day yesterday. Daughter reported that patient was seen in the ED setting and admitted for a fall due to syncope on 05/06/2014 - daughter reported that she is concerned that the patient has a concussion. Daughter reported that the patient lives with her and her husband - stated that the patient moved in with her in February 2015. Patient reported that she has been having increase in coughing with phlegm that started a couple of days ago. Patient's son-in-law reported that she has been having slurred speech and difficulty forming sentences. Denied chest pain, shortness of breath, difficulty breathing, abdominal pain, nausea, vomiting, diarrhea,  melena, hematochezia, urinary issues, falls, head injury, visual changes, facial drooping.  PCP Dr. Wallene Huh inpatient nursing facility.   Past Medical History  Diagnosis Date  . COPD (chronic obstructive pulmonary disease)   . Arthritis   . Cancer   . Breast cancer     right  . Hypertension   . AKI (acute kidney injury) 10/01/2013  . Shortness of breath   . Diabetes mellitus without complication     TYPE 2   Past Surgical History  Procedure Laterality Date  . Laparoscopic hysterectomy    . Appendectomy    . Laparoscopic cholecystectomy    . Breast surgery Right lumpectomy   Family History  Problem Relation Age of Onset  . Lung cancer Father   . Heart disease Brother    History  Substance Use Topics  . Smoking status: Former Smoker -- 2.00 packs/day for 30 years    Types: Cigarettes    Quit date: 02/20/1980  . Smokeless tobacco: Never Used  . Alcohol Use: No   OB History   Grav Para Term Preterm Abortions TAB SAB Ect Mult Living                 Review of Systems  Unable to perform ROS: Mental status change  Constitutional: Negative for fever and chills.  Respiratory: Positive for cough. Negative for chest tightness and shortness of breath.   Cardiovascular: Negative for chest pain.  Gastrointestinal: Negative for nausea, vomiting, abdominal pain and diarrhea.  Musculoskeletal: Negative for back pain and neck pain.  Neurological: Negative  for dizziness, weakness, numbness and headaches.      Allergies  Tetanus toxoids; Latex; and Penicillins  Home Medications   Prior to Admission medications   Medication Sig Start Date End Date Taking? Authorizing Provider  acetaminophen (TYLENOL) 500 MG tablet Take 500 mg by mouth every 6 (six) hours as needed for mild pain.   Yes Historical Provider, MD  albuterol (PROVENTIL HFA;VENTOLIN HFA) 108 (90 BASE) MCG/ACT inhaler Inhale 2 puffs into the lungs every 6 (six) hours as needed for wheezing or  shortness of breath. 10/04/13  Yes Modena Jansky, MD  albuterol (PROVENTIL) (2.5 MG/3ML) 0.083% nebulizer solution Take 2.5 mg by nebulization every 6 (six) hours.   Yes Historical Provider, MD  aspirin EC 81 MG tablet Take 81 mg by mouth every evening.   Yes Historical Provider, MD  benazepril (LOTENSIN) 40 MG tablet Take 40 mg by mouth daily.   Yes Historical Provider, MD  budesonide (PULMICORT) 0.5 MG/2ML nebulizer solution Take 0.5 mg by nebulization 2 (two) times daily.   Yes Historical Provider, MD  CALCIUM PO Take 1 tablet by mouth daily.    Yes Historical Provider, MD  Cholecalciferol (VITAMIN D PO) Take 1 capsule by mouth every evening.   Yes Historical Provider, MD  furosemide (LASIX) 40 MG tablet Take 40 mg by mouth daily.    Yes Historical Provider, MD  Ketotifen Fumarate (ALAWAY OP) Apply 1 drop to eye 2 (two) times daily as needed. Both eyes For itchy eyes   Yes Historical Provider, MD  Magnesium 250 MG TABS Take 1 tablet by mouth daily.   Yes Historical Provider, MD  metoprolol succinate (TOPROL-XL) 50 MG 24 hr tablet Take 25 mg by mouth daily. Take with or immediately following a meal.   Yes Historical Provider, MD  Multiple Vitamin (MULTIVITAMIN WITH MINERALS) TABS Take 1 tablet by mouth every evening.   Yes Historical Provider, MD  Multiple Vitamins-Minerals (PRESERVISION AREDS) CAPS Take 1 capsule by mouth 2 (two) times daily.   Yes Historical Provider, MD  Omega-3 Fatty Acids (FISH OIL PO) Take 1 capsule by mouth every evening.   Yes Historical Provider, MD  oxyCODONE (OXY IR/ROXICODONE) 5 MG immediate release tablet Take 1 tablet (5 mg total) by mouth every 4 (four) hours as needed for moderate pain. 05/08/14  Yes Charlynne Cousins, MD  potassium chloride (K-DUR,KLOR-CON) 10 MEQ tablet Take 10 mEq by mouth daily.   Yes Historical Provider, MD  sertraline (ZOLOFT) 50 MG tablet Take 50 mg by mouth daily.  10/07/13  Yes Historical Provider, MD  tiotropium (SPIRIVA HANDIHALER) 18  MCG inhalation capsule Place 1 capsule (18 mcg total) into inhaler and inhale daily. 10/04/13  Yes Modena Jansky, MD  verapamil (CALAN) 120 MG tablet Take 60 mg by mouth 2 (two) times daily.    Yes Historical Provider, MD   BP 163/72  Pulse 79  Temp(Src) 98.4 F (36.9 C) (Oral)  Resp 18  SpO2 95% Physical Exam  Nursing note and vitals reviewed. Constitutional: She is oriented to person, place, and time. She appears well-developed and well-nourished. No distress.  HENT:  Head: Normocephalic and atraumatic.  Mouth/Throat: Oropharynx is clear and moist. No oropharyngeal exudate.  Healing scab to the right parietal region of the scalp.   Eyes: Conjunctivae and EOM are normal. Pupils are equal, round, and reactive to light. Right eye exhibits no discharge. Left eye exhibits no discharge.  Visual fields grossly intact  Negative nystagmus  Neck: Normal range of motion.  Neck supple. No tracheal deviation present.  Negative neck stiffness Negative nuchal rigidity  Negative cervical lymphadenopathy Negative meningeal   Cardiovascular: Normal rate, regular rhythm and normal heart sounds.  Exam reveals no friction rub.   No murmur heard. Cap refill < 3 seconds Negative swelling or pitting edema noted to the lower extremities bilaterally   Pulmonary/Chest: Effort normal and breath sounds normal. No respiratory distress. She has no wheezes. She has no rales. She exhibits no tenderness.  Patient is able to speak in full sentences without difficulty  Negative use of accessory muscles Negative stridor  Abdominal: Soft. Bowel sounds are normal. She exhibits no distension. There is no tenderness. There is no rebound and no guarding.  Negative abdominal distension noted BS normoactive in all 4 quadrants Abdomen soft upon palpation  Negative peritoneal signs Negative rigidity and guarding  Healing ecchymosis to the LLQ  Musculoskeletal: Normal range of motion. She exhibits no edema and no  tenderness.  Ecchymosis noted to the left third toe with full ROM and negative pain upon palpation.   Full ROM to upper and lower extremities without difficulty noted, negative ataxia noted.  Lymphadenopathy:    She has no cervical adenopathy.  Neurological: She is alert and oriented to person, place, and time. No cranial nerve deficit. She exhibits normal muscle tone. Coordination normal.  Cranial nerves III-XII grossly intact Strength 5+/5+ to upper and lower extremities bilaterally with resistance applied, equal distribution noted Equal grip strength  Negative facial drooping  Negative slurred speech  Negative aphasia Negative arm drift Fine motor skills intact Patient answers questions well  Patient follows commands appropriately   Skin: Skin is warm and dry. No rash noted. She is not diaphoretic. No erythema.  Psychiatric: She has a normal mood and affect. Her behavior is normal. Thought content normal.    ED Course  Procedures (including critical care time)  4:30 PM This provider spoke with Claudette Head, Social worker - discussed case and that family is interested in inpatient nursing facility. Tessa to come and see patient.   6:24 PM Discussed case in great detail with Dr. Lyman Speller - patient to be seen and assessed.   7:03 PM This provider spoke with Amy, Case Manager - reported that patient can be discharged. Reported that she has spoke with the family who are in agreement. Stated that the patient will need [place] face to face - PT, OT, RN, Aide, social worker for at home care.   7:39 PM This provider was made aware that the patient ambulated well without difficulty.   Results for orders placed during the hospital encounter of 05/19/14  CBC WITH DIFFERENTIAL      Result Value Ref Range   WBC 9.7  4.0 - 10.5 K/uL   RBC 3.95  3.87 - 5.11 MIL/uL   Hemoglobin 11.2 (*) 12.0 - 15.0 g/dL   HCT 35.7 (*) 36.0 - 46.0 %   MCV 90.4  78.0 - 100.0 fL   MCH 28.4  26.0 - 34.0  pg   MCHC 31.4  30.0 - 36.0 g/dL   RDW 18.7 (*) 11.5 - 15.5 %   Platelets 246  150 - 400 K/uL   Neutrophils Relative % 71  43 - 77 %   Neutro Abs 6.8  1.7 - 7.7 K/uL   Lymphocytes Relative 18  12 - 46 %   Lymphs Abs 1.8  0.7 - 4.0 K/uL   Monocytes Relative 8  3 - 12 %  Monocytes Absolute 0.8  0.1 - 1.0 K/uL   Eosinophils Relative 3  0 - 5 %   Eosinophils Absolute 0.3  0.0 - 0.7 K/uL   Basophils Relative 0  0 - 1 %   Basophils Absolute 0.0  0.0 - 0.1 K/uL  BASIC METABOLIC PANEL      Result Value Ref Range   Sodium 138  137 - 147 mEq/L   Potassium 4.0  3.7 - 5.3 mEq/L   Chloride 101  96 - 112 mEq/L   CO2 27  19 - 32 mEq/L   Glucose, Bld 116 (*) 70 - 99 mg/dL   BUN 16  6 - 23 mg/dL   Creatinine, Ser 0.93  0.50 - 1.10 mg/dL   Calcium 9.8  8.4 - 10.5 mg/dL   GFR calc non Af Amer 53 (*) >90 mL/min   GFR calc Af Amer 62 (*) >90 mL/min   Anion gap 10  5 - 15  TROPONIN I      Result Value Ref Range   Troponin I <0.30  <0.30 ng/mL  URINALYSIS, ROUTINE W REFLEX MICROSCOPIC      Result Value Ref Range   Color, Urine YELLOW  YELLOW   APPearance CLEAR  CLEAR   Specific Gravity, Urine 1.017  1.005 - 1.030   pH 6.5  5.0 - 8.0   Glucose, UA NEGATIVE  NEGATIVE mg/dL   Hgb urine dipstick NEGATIVE  NEGATIVE   Bilirubin Urine NEGATIVE  NEGATIVE   Ketones, ur NEGATIVE  NEGATIVE mg/dL   Protein, ur NEGATIVE  NEGATIVE mg/dL   Urobilinogen, UA 1.0  0.0 - 1.0 mg/dL   Nitrite NEGATIVE  NEGATIVE   Leukocytes, UA SMALL (*) NEGATIVE  ETHANOL      Result Value Ref Range   Alcohol, Ethyl (B) <11  0 - 11 mg/dL  ACETAMINOPHEN LEVEL      Result Value Ref Range   Acetaminophen (Tylenol), Serum <15.0  10 - 30 ug/mL  AMMONIA      Result Value Ref Range   Ammonia 24  11 - 60 umol/L  URINE MICROSCOPIC-ADD ON      Result Value Ref Range   Squamous Epithelial / LPF RARE  RARE   WBC, UA 3-6  <3 WBC/hpf   RBC / HPF 0-2  <3 RBC/hpf   Casts HYALINE CASTS (*) NEGATIVE  I-STAT CG4 LACTIC ACID, ED       Result Value Ref Range   Lactic Acid, Venous 1.02  0.5 - 2.2 mmol/L  CBG MONITORING, ED      Result Value Ref Range   Glucose-Capillary 103 (*) 70 - 99 mg/dL   Comment 1 Notify RN      Labs Review Labs Reviewed  CBC WITH DIFFERENTIAL - Abnormal; Notable for the following:    Hemoglobin 11.2 (*)    HCT 35.7 (*)    RDW 18.7 (*)    All other components within normal limits  BASIC METABOLIC PANEL - Abnormal; Notable for the following:    Glucose, Bld 116 (*)    GFR calc non Af Amer 53 (*)    GFR calc Af Amer 62 (*)    All other components within normal limits  URINALYSIS, ROUTINE W REFLEX MICROSCOPIC - Abnormal; Notable for the following:    Leukocytes, UA SMALL (*)    All other components within normal limits  URINE MICROSCOPIC-ADD ON - Abnormal; Notable for the following:    Casts HYALINE CASTS (*)    All other  components within normal limits  CBG MONITORING, ED - Abnormal; Notable for the following:    Glucose-Capillary 103 (*)    All other components within normal limits  URINE CULTURE  TROPONIN I  ETHANOL  ACETAMINOPHEN LEVEL  AMMONIA  I-STAT CG4 LACTIC ACID, ED    Imaging Review Dg Thoracic Spine 2 View  05/19/2014   CLINICAL DATA:  Fall 1 week ago with mid thoracic pain.  EXAM: THORACIC SPINE - 2 VIEW  COMPARISON:  10/01/2013 chest CT  FINDINGS: Mild T7 compression fracture, with height loss less than 25%, which is chronic based on previous imaging. The T6 vertebral body is also mildly wedged. This is new from chest CT 10/01/2013 and may have already been present on 05/07/2014 radiography. Prominent concavity of the T9 upper endplate is chronic based on prior.  No subluxation.  Diffuse osteopenia.  Diffuse spurring of the mid and lower thoracic endplates.  IMPRESSION: 1. T6 compression fracture with mild height loss. The fracture has occurred since January 2015 and is age indeterminate. 2. Remote T7 compression fracture. 3. No subluxation.   Electronically Signed   By:  Jorje Guild M.D.   On: 05/19/2014 16:52   Ct Head Wo Contrast  05/19/2014   CLINICAL DATA:  Altered mental status and weakness  EXAM: CT HEAD WITHOUT CONTRAST  TECHNIQUE: Contiguous axial images were obtained from the base of the skull through the vertex without intravenous contrast.  COMPARISON:  05/06/2014  FINDINGS: Skull and Sinuses:Negative for fracture or destructive process. The mastoids, middle ears, and imaged paranasal sinuses are clear.  Orbits: Bilateral cataract resection.  Brain: No evidence of acute abnormality, such as acute infarction, hemorrhage, hydrocephalus, or mass lesion/mass effect. Generalized brain atrophy, typical for age. There is moderate chronic small vessel disease with pattern unchanged from prior. Ischemic gliosis is confluent throughout the deep cerebral white matter, especially in the parietal lobes.  2 cm diameter high-density, partly calcified mass along the left cerebellum is stable from 2003 when correlated with imaging report (images not available).  IMPRESSION: 1. No acute intracranial findings. 2. 2 cm left cerebellar meningioma, size stable from 2003. 3. Senescent changes include atrophy and moderate small vessel disease.   Electronically Signed   By: Jorje Guild M.D.   On: 05/19/2014 16:39   Dg Chest Port 1 View  05/19/2014   CLINICAL DATA:  Altered mental status  EXAM: PORTABLE CHEST - 1 VIEW  COMPARISON:  05/07/2014  FINDINGS: Chronic mild cardiomegaly. Stable upper mediastinal contours. Diffuse interstitial coarsening which is above baseline. No overt edema. No definitive pneumonia. No effusion or pneumothorax. Biapical emphysema.  IMPRESSION: 1. Interstitial coarsening which could be congestive or bronchitic. 2. Emphysema.   Electronically Signed   By: Jorje Guild M.D.   On: 05/19/2014 15:51   Dg Shoulder Left  05/19/2014   CLINICAL DATA:  Left shoulder pain after fall.  EXAM: LEFT SHOULDER - 2+ VIEW  COMPARISON:  None.  FINDINGS: There is no evidence  of fracture or dislocation. There is no evidence of arthropathy or other focal bone abnormality. Soft tissues are unremarkable.  IMPRESSION: Normal left shoulder.   Electronically Signed   By: Sabino Dick M.D.   On: 05/19/2014 16:23   Dg Foot Complete Left  05/19/2014   CLINICAL DATA:  Fall.  EXAM: LEFT FOOT - COMPLETE 3+ VIEW  COMPARISON:  None.  FINDINGS: Diffuse osteopenia degenerative change. No evidence of fracture or dislocation.  IMPRESSION: Diffuse osteopenia degenerative change.  No acute abnormality.  Electronically Signed   By: Marcello Moores  Register   On: 05/19/2014 16:23     EKG Interpretation   Date/Time:  Thursday May 19 2014 14:26:06 EDT Ventricular Rate:  71 PR Interval:  177 QRS Duration: 93 QT Interval:  410 QTC Calculation: 446 R Axis:   37 Text Interpretation:  Sinus rhythm Probable anteroseptal infarct, old  since last tracing no significant change Abnormal ekg Confirmed by MILLER   MD, BRIAN ([id number]) on 05/19/2014 3:31:04 PM      MDM   Final diagnoses:  Dementia, without behavioral disturbance  Altered mental status, unspecified altered mental status type    Medications  0.9 %  sodium chloride infusion ( Intravenous Stopped 05/19/14 1939)    Filed Vitals:   05/19/14 1337 05/19/14 1825 05/19/14 1930  BP: 158/58 201/90 163/72  Pulse: 69 80 79  Temp: 98.3 F (36.8 C)  98.4 F (36.9 C)  TempSrc: Oral  Oral  Resp: 16 20 18   SpO2: 95% 96% 95%   This provider reviewed the patient's chart. Patient was seen and assessed in the ED setting after a syncopal episode where she fell and resulted in a laceration to the right side of her scalp. Patient was admitted and discharged within 48 hours.  EKG noted normal sinus rhythm with a heart rate of 71 bpm - no significant change noted from previous tracing. Troponin negative elevation. CBC negative findings. BMP unremarkable - negative gap. Lactic acid negative elevation. CBG 103. Ethanol and Acetaminophen level negative  elevation. Ammonia negative elevation. UA noted small leukocytes with WBC 3-6, hyaline casts noted. Urine culture pending. Plain film left shoulder with negative acute abnormalities. Plain film left foot noted diffuse osteopenia degenerative changes - no acute abnormalities noted. Chest xray noted interstitial coarsening that could be congestive versus bronchitis, emphysema noted. Thoracic plain film noted T6 compression fracture with mild height loss - occurred since January 2015. Old T7 compression fracture - no subluxation. CT head no acute intracranial abnormalities noted. 2 cm left cerebellar meningioma noted - stable since 2003.  Patient seen and assessed by attending physician, Dr. Hazle Nordmann. As per attending recommended that patient be seen by social worker for inpatient nursing facility as per family request. Patient seen by social worker who reported that patient does not qualify at the moment for inpatient nursing facility - recommended home health aide with PT/OT as an outpatient. Family unhappy about this and reported that the patient is altered and cannot go home.  Patient seen by attending physician, case management, social worker, hospitalist-as discussed with family the patient does not meet criteria for admission. Home health aide orders have been placed for PT, OT, nurse, social worker, home health aid. Negative findings of acute intracranial processes. Doubt meningitis. Patient does have history of head trauma 05/06/2014 - cannot rule out possible concussion. Negative findings of pneumonia. Negative findings for urinary tract infection. Patient stable, afebrile. Patient not septic appearing. Patient alert and oriented-follows commands well and responds to questions appropriately. Patient ambulated well with negative ataxia noted. Patient neurologically intact. Discharged patient. Recommended patient to be followed up with primary care provider. Discussed with patient to closely monitor symptoms  and if symptoms are to worsen or change to report back to the ED - strict return instructions given.  Patient agreed to plan of care, understood, all questions answered.   Jamse Mead, PA-C 05/19/14 2003  [address], PA-C 05/19/14 2059

## 2014-05-19 NOTE — Progress Notes (Addendum)
  CARE MANAGEMENT ED NOTE 05/19/2014  Patient:  Adrienne [name], Adrienne [name]   Account Number:  [id number]  Date Initiated:  05/19/2014  Documentation initiated by:  Jackelyn Poling  Subjective/Objective Assessment:   80 yr old medicare [place] pt fell on 8/21 and was seen at the ED for laceration to her scalp. Family states the pt "has not been acting the same" since the fall and sleeping more than usual. Pt lives with family. The pt's doctor     Subjective/Objective Assessment Detail:   wanted pt sent to ED for eval. Pt A&Ox4, denies pain.  pcp Trilby Drummer  Pt lives with Daughter and son in law but they want to get her back to her home eventually Pt has been seen and d/c from [facility] services Daughter states pt had home health via [facility] "that didn't work" Offer of home health denied  Daughter states pt seen by neurologist and is not aware of daughter wanting pt to go to [facility] "five minutes away from the house" Daughter tells Cm that pt has "Dementia" but this is not a documented dx and pt is alert and oriented x 3   Daughter's husband pacing back and forth as CM spoke with daughter  Daughter reports in about a week she and husband are scheduled to be out of town and "afraid the person we have to take care of her in the mornings will not be able to handle her if it took [place] twenty minutes to get her off the floor today when she rolled off the bed" "I baby her" Daughter is medical POA. Daughter states pt is not aware of "going to rehab" Son in law suggested daughter "speak with Estill Bamberg about it" Report 2 other siblings but one lives out of town and the other one " is useless" Daughter reports she has medical issues and the son in law does also  Pt [facility] used [facility]     Action/Plan:   ED CM contacted by [facility] staff after a call received from daughter about request of further care. Cm spoke with son in law who directed CM to daughter as she was speaking with the ED RN  about getting pt to "rehab" CM answered questions.   Action/Plan Detail:   CM discussed medicare guidelines, POA status for oriented pt, encouraged daughter to share plan of "rehab" with pt, & waiting to see if pt test indicate admission is needed   Anticipated DC Date:  05/19/2014     Status Recommendation to Physician:   Result of Recommendation:    Other ED [place]  Other  PCP issues  Outpatient Services - Pt will follow up    Choice offered to / List presented to:            Status of service:  Completed, signed off  ED Comments:   ED Comments Detail:  Discussed with daughter that depending on 05/19/14 ED clinicals and decision to admit or not admit pt may be offered home health with [facility] to assist with her request for pt placement to [facility]

## 2014-05-19 NOTE — ED Notes (Signed)
Bed: WA19 Expected date:  Expected time:  Means of arrival:  Comments: fall 

## 2014-05-19 NOTE — Progress Notes (Signed)
CARE MANAGEMENT ED NOTE 05/19/2014  Patient:  Adrienne [name], Adrienne [name]   Account Number:  [id number]  Date Initiated:  05/19/2014  Documentation initiated by:  Livia Snellen  Subjective/Objective Assessment:   Patient presents to Ed with AMS     Subjective/Objective Assessment Detail:   Patient with pmhx of: COPD, arthritis, breast cancer, HTN, shortness of breath, DM, acute kidney injury     Action/Plan:   Action/Plan Detail:   Anticipated DC Date:  05/19/2014     Status Recommendation to Physician:   Result of Recommendation:    Other ED Services  Consult Working Plan   In-house referral  Clinical Social Worker   DC Forensic scientist  CM consult  Other   PAC Choice  [facility]   Choice offered to / List presented to:  C-4 Adult Children  DME arranged  HOSPITAL BED     DME agency  [place] arranged  HH-1 RN  [place]      [place].    Status of service:  Completed, signed off  ED Comments:   ED Comments Detail:  05/19/2014 A. Lennart Gladish RNCM 1943pm EDCM spoke to patient, her daughter Adrienne [name] and patient's son in law at bedside.  Patient is awake alert and oriented x3.  Patient lives at home with her daughter and son in law.  Patient's daughter reports she believes the patient has had home health services with [facility] in the past.  [facility] called [facility] at 1825pm and spoke to Jardine who confirms that the patient was active with [facility] in Feb. and March with PT. Patient's daughter voiced her concerns about patient being discharged home.  Patient's daughter aware that patient needs a three day qualifying stay in the hospital to go to Rehab.  Patient's daughter also reports patient was previously active with [facility] and wishes to be reinstated with them.  Hospitalist entered room while [facility] speaking to patient and family and reviewed labs and radiology results with  patient and family.  EDCM discussed patient with EDPA who reports patient does not meet criteria for admission. EDCM asked patient's daughter  if the patient needed any equipment at home?  Patient's daughter requesting a hospital bed.  EDCM advised patient's family that [facility] may not pay for hospital bed unless the patient is bed bound.  Patient's family still requesting order for hospital bed.  Order for hospital bed at home placed. Patient's daughter reports patient has a bedside commode at home, a walker and a oxygen concentrator as the patient wears oxygen at night which is supplied by [employer]. Patient's daughter and her husband are also planning on trying to get a hospital bed from their church.  EDCM provided patient's daughter with list of home health agencies in [facility].  Family chose [facility].  Home health orders for RN, PT, OT, aide and social worker placed as patient's daughter requesting assistance with steps to getting Medicaid for patient.  Referral for [facility] placed.  Phone number for DSS for Medicaid given. Printed information regarding the [employer] given.  Printed list of private duty nursing agencies given to patient's daughter and informed her that it will be an out of pocket expense.  It. is documented patient ambulated with walker without assistance in Ed hall way.  Patient's family agreeable to home health plan.  EDCM instructed patient not  to get out of bed on her own. Patient's daughter reports patient has a bell and baby monitor next to her bed so that they can hear her when she gets up at night.  Patient's family thankful for services. No further EDCM needs at this time.

## 2014-05-19 NOTE — Discharge Instructions (Signed)
Please call your doctor for a followup appointment within 24-48 hours. When you talk to your doctor please let them know that you were seen in the emergency department and have them acquire all of your records so that they can discuss the findings with you and formulate a treatment plan to fully care for your new and ongoing problems. Please call and set-up an appointment with your primary care provider Please rest and stay hydrated  Please avoid any physical or strenuous activity  Please continue to take at home medications as prescribed Please continue to monitor symptoms closely and if symptoms are to worsen or change (fever greater than 101, chills, sweating, nausea, vomiting, chest pain, shortness of breathe, difficulty breathing, weakness, numbness, tingling, worsening or changes to pain pattern, fall, injury, changes to behavior, facial drooping, slurred speech, aphasia) please report back to the Emergency Department immediately.    Confusion Confusion is the inability to think with your usual speed or clarity. Confusion may come on quickly or slowly over time. How quickly the confusion comes on depends on the cause. Confusion can be due to any number of causes. CAUSES   Concussion, head injury, or head trauma.  Seizures.  Stroke.  Fever.  Brain tumor.  Age related decreased brain function (dementia).  Heightened emotional states like rage or terror.  Mental illness in which the person loses the ability to determine what is real and what is not (hallucinations).  Infections such as a urinary tract infection (UTI).  Toxic effects from alcohol, drugs, or prescription medicines.  Dehydration and an imbalance of salts in the body (electrolytes).  Lack of sleep.  Low blood sugar (diabetes).  Low levels of oxygen from conditions such as chronic lung disorders.  Drug interactions or other medicine side effects.  Nutritional deficiencies, especially niacin, thiamine, vitamin  C, or vitamin B.  Sudden drop in body temperature (hypothermia).  Change in routine, such as when traveling or hospitalized. SIGNS AND SYMPTOMS  People often describe their thinking as cloudy or unclear when they are confused. Confusion can also include feeling disoriented. That means you are unaware of where or who you are. You may also not know what the date or time is. If confused, you may also have difficulty paying attention, remembering, and making decisions. Some people also act aggressively when they are confused.  DIAGNOSIS  The medical evaluation of confusion may include:  Blood and urine tests.  X-rays.  Brain and nervous system tests.  Analyzing your brain waves (electroencephalogram or EEG).  Magnetic resonance imaging (MRI) of your head.  Computed tomography (CT) scan of your head.  Mental status tests in which your health care provider may ask many questions. Some of these questions may seem silly or strange, but they are a very important test to help diagnose and treat confusion. TREATMENT  An admission to the hospital may not be needed, but a person with confusion should not be left alone. Stay with a family member or friend until the confusion clears. Avoid alcohol, pain relievers, or sedative drugs until you have fully recovered. Do not drive until directed by your health care provider. HOME CARE INSTRUCTIONS  What family and friends can do:  To find out if someone is confused, ask the person to state his or her name, age, and the date. If the person is unsure or answers incorrectly, he or she is confused.  Always introduce yourself, no matter how well the person knows you.  Often remind the person of  his or her location.  Place a calendar and clock near the confused person.  Help the person with his or her medicines. You may want to use a pill box, an alarm as a reminder, or give the person each dose as prescribed.  Talk about current events and plans for the  day.  Try to keep the environment calm, quiet, and peaceful.  Make sure the person keeps follow-up visits with his or her health care provider. PREVENTION  Ways to prevent confusion:  Avoid alcohol.  Eat a balanced diet.  Get enough sleep.  Take medicine only as directed by your health care provider.  Do not become isolated. Spend time with other people and make plans for your days.  Keep careful watch on your blood sugar levels if you are diabetic. SEEK IMMEDIATE MEDICAL CARE IF:   You develop severe headaches, repeated vomiting, seizures, blackouts, or slurred speech.  There is increasing confusion, weakness, numbness, restlessness, or personality changes.  You develop a loss of balance, have marked dizziness, feel uncoordinated, or fall.  You have delusions, hallucinations, or develop severe anxiety.  Your family members think you need to be rechecked. Document Released: 10/10/2004 Document Revised: 01/17/2014 Document Reviewed: 10/08/2013 [facility] Patient Information 2015 [place], [place]. This information is not intended to replace advice given to you by your health care provider. Make sure you discuss any questions you have with your health care provider.

## 2014-05-19 NOTE — ED Notes (Addendum)
Per EMS, pt fell on 8/21 and was seen at the ED for laceration to her scalp. Family states the pt "has not been acting the same" since the fall and sleeping more than usual. Pt lives with family. The pt's doctor wanted pt sent to ED for eval. Pt A&Ox4, denies pain.

## 2014-05-19 NOTE — ED Provider Notes (Signed)
80 year old female, history of dementia and a recent head injury where she required a repair of her scalp on the posterior right side. She has been living with family members, they have stated that she has not been herself over the last couple of days and has had increased sleepiness and confusion. She has had increased appetite. On exam the patient answers my questions appropriately, moves all extremities x4 without difficulty, has clear heart and lung sounds, soft abdomen and appears alert. Will discuss with social work regarding rehabilitation placement, repeat labs and CT scan don't low risk for acute bleed. The family members have said that she has had a couple of falls in the last week, they seemed to be falling from bed, imaging pending.  Family informed of no acute medical problems, SW consulted and care manager aided in assisting family with in home care, no indication for admission at this time  Medical screening examination/treatment/procedure(s) were conducted as a shared visit with non-physician practitioner(s) and myself.  I personally evaluated the patient during the encounter.  Clinical Impression: Generalized Weakness, Concussion      Johnna Acosta, MD 05/20/14 1108

## 2014-05-20 NOTE — ED Provider Notes (Signed)
Medical screening examination/treatment/procedure(s) were conducted as a shared visit with non-physician practitioner(s) and myself.  I personally evaluated the patient during the encounter  Please see my separate respective documentation pertaining to this patient encounter   Johnna Acosta, MD 05/20/14 1108

## 2014-05-20 NOTE — Progress Notes (Signed)
05/20/2014 A. Aidynn Krenn RNCM 1541pm EDCM spoke to Joellen Jersey of [facility] who instructed EDCM to fax order for hospital bed to office and she will make them aware.  [facility] faxed order for hospital bed to [facility] at 1543pm with confirmstion of receipt at 1547pm.  No further EDCM needs at this time.

## 2014-05-21 NOTE — ED Provider Notes (Signed)
I directly supervised this procedure.   Elyn Peers, MD 05/21/14 [phone]

## 2014-05-22 LAB — URINE CULTURE: Colony Count: >=100000

## 2014-05-23 ENCOUNTER — Telehealth (HOSPITAL_COMMUNITY): Payer: Self-pay

## 2014-05-23 NOTE — ED Notes (Signed)
Post ED Visit - Positive Culture Follow-up: Successful Patient Follow-Up  Culture assessed and recommendations reviewed by: [x]  Wes Newkirk, Pharm.D., BCPS []  Heide Guile, Pharm.D., BCPS []  Alycia Rossetti, Pharm.D., BCPS []  [place], Pharm.D., BCPS, AAHIVP []  Legrand Como, Pharm.D., BCPS, AAHIVP []  Hassie Bruce, Pharm.D. []  Milus Glazier, [place].D.  Positive urine culture  [x]  Patient discharged without antimicrobial prescription and treatment is now indicated []  Organism is resistant to prescribed ED discharge antimicrobial []  Patient with positive blood cultures  Changes discussed with ED provider: Lorre Munroe New antibiotic prescription Levofloxacin 250 mg po daily x 5 days. No refills Called to [facility] [place]  Contacted patient, date 05/23/14, time 1604   Ileene Musa 05/23/2014, 4:00 PM

## 2014-05-23 NOTE — Progress Notes (Signed)
ED Antimicrobial Stewardship Positive Culture Follow Up   Adrienne [name] is an 80 y.o. female who presented to [facility] on 05/19/2014 with a chief complaint of  Chief Complaint  Patient presents with  . Altered Mental Status    Recent Results (from the past 720 hour(s))  URINE CULTURE     Status: None   Collection Time    05/19/14  2:32 PM      Result Value Ref Range Status   Specimen Description URINE, CATHETERIZED   Final   Special Requests NONE   Final   Culture  Setup Time     Final   Value: 05/19/2014 19:51     Performed at [place]     Final   Value: >=100,000 COLONIES/ML     Performed at [facility]   Culture     Final   Value: ENTEROCOCCUS SPECIES     Performed at [facility]   Report Status 05/22/2014 FINAL   Final   Organism ID, Bacteria ENTEROCOCCUS SPECIES   Final    []  Treated with , organism resistant to prescribed antimicrobial [x]  Patient discharged originally without antimicrobial agent. Discussed with PA Marlon Pel and he wishes to treat UTI with levofloxacin.  New antibiotic prescription: Levofloxacin 250mg  PO daily x 5 days  ED Provider: Montine Circle, PA-C   Earleen Newport 05/23/2014, 4:02 PM Infectious Diseases Pharmacist Phone# [phone]

## 2014-05-25 NOTE — Progress Notes (Signed)
05/25/2014 A. Alonnie Bieker RNCM 1632pm EDCM called patient's daughter Adrienne [name] for follow up.  As per Adrienne [name], patient is doing better.  [name] reports both she and her mother are very pleased with [facility].  So far, PT, OT and an aide have shown up to patient's house.  Patient's daughter reports she has cancelled the order for the hospital bed from [facility] she has received a hospital bed from her church with rails.  Adrienne [name] reports she will be having friends and family assist her in taking care of her mother and patient will have 24 hour care while they go on vacation.  Patien's daughter very thankful for [facility] services.  No further EDCM needs at this time.

## 2014-07-11 ENCOUNTER — Ambulatory Visit: Payer: Medicare Other | Admitting: Internal Medicine

## 2014-08-08 ENCOUNTER — Inpatient Hospital Stay (HOSPITAL_COMMUNITY)
Admission: EM | Admit: 2014-08-08 | Discharge: 2014-08-12 | DRG: 689 | Disposition: A | Discharge status: Skilled Nursing Facility | Payer: Medicare Other | Attending: Internal Medicine | Admitting: Internal Medicine

## 2014-08-08 ENCOUNTER — Emergency Department (HOSPITAL_COMMUNITY): Payer: Medicare Other

## 2014-08-08 ENCOUNTER — Encounter (HOSPITAL_COMMUNITY): Payer: Self-pay | Admitting: Emergency Medicine

## 2014-08-08 DIAGNOSIS — G9340 Encephalopathy, unspecified: Secondary | ICD-10-CM | POA: Diagnosis present

## 2014-08-08 DIAGNOSIS — I129 Hypertensive chronic kidney disease with stage 1 through stage 4 chronic kidney disease, or unspecified chronic kidney disease: Secondary | ICD-10-CM | POA: Diagnosis present

## 2014-08-08 DIAGNOSIS — R296 Repeated falls: Secondary | ICD-10-CM | POA: Diagnosis present

## 2014-08-08 DIAGNOSIS — E119 Type 2 diabetes mellitus without complications: Secondary | ICD-10-CM | POA: Diagnosis present

## 2014-08-08 DIAGNOSIS — R531 Weakness: Secondary | ICD-10-CM

## 2014-08-08 DIAGNOSIS — F32A Depression, unspecified: Secondary | ICD-10-CM

## 2014-08-08 DIAGNOSIS — Z853 Personal history of malignant neoplasm of breast: Secondary | ICD-10-CM

## 2014-08-08 DIAGNOSIS — Z880 Allergy status to penicillin: Secondary | ICD-10-CM

## 2014-08-08 DIAGNOSIS — B9620 Unspecified Escherichia coli [E. coli] as the cause of diseases classified elsewhere: Secondary | ICD-10-CM | POA: Diagnosis present

## 2014-08-08 DIAGNOSIS — Z9981 Dependence on supplemental oxygen: Secondary | ICD-10-CM

## 2014-08-08 DIAGNOSIS — N390 Urinary tract infection, site not specified: Secondary | ICD-10-CM | POA: Diagnosis not present

## 2014-08-08 DIAGNOSIS — F329 Major depressive disorder, single episode, unspecified: Secondary | ICD-10-CM

## 2014-08-08 DIAGNOSIS — J449 Chronic obstructive pulmonary disease, unspecified: Secondary | ICD-10-CM | POA: Diagnosis present

## 2014-08-08 DIAGNOSIS — I5032 Chronic diastolic (congestive) heart failure: Secondary | ICD-10-CM | POA: Diagnosis present

## 2014-08-08 DIAGNOSIS — Z87891 Personal history of nicotine dependence: Secondary | ICD-10-CM | POA: Diagnosis not present

## 2014-08-08 DIAGNOSIS — N183 Chronic kidney disease, stage 3 (moderate): Secondary | ICD-10-CM | POA: Diagnosis present

## 2014-08-08 DIAGNOSIS — Z887 Allergy status to serum and vaccine status: Secondary | ICD-10-CM

## 2014-08-08 DIAGNOSIS — M1990 Unspecified osteoarthritis, unspecified site: Secondary | ICD-10-CM | POA: Diagnosis present

## 2014-08-08 DIAGNOSIS — R5381 Other malaise: Secondary | ICD-10-CM

## 2014-08-08 DIAGNOSIS — I10 Essential (primary) hypertension: Secondary | ICD-10-CM | POA: Diagnosis present

## 2014-08-08 DIAGNOSIS — R4182 Altered mental status, unspecified: Secondary | ICD-10-CM | POA: Diagnosis present

## 2014-08-08 DIAGNOSIS — W19XXXA Unspecified fall, initial encounter: Secondary | ICD-10-CM

## 2014-08-08 DIAGNOSIS — N3000 Acute cystitis without hematuria: Secondary | ICD-10-CM

## 2014-08-08 LAB — CBC
HCT: 43 % (ref 36.0–46.0)
HEMOGLOBIN: 13.3 g/dL (ref 12.0–15.0)
MCH: 29.6 pg (ref 26.0–34.0)
MCHC: 30.9 g/dL (ref 30.0–36.0)
MCV: 95.6 fL (ref 78.0–100.0)
Platelets: 181 10*3/uL (ref 150–400)
RBC: 4.5 MIL/uL (ref 3.87–5.11)
RDW: 15.5 % (ref 11.5–15.5)
WBC: 13.7 10*3/uL — ABNORMAL HIGH (ref 4.0–10.5)

## 2014-08-08 LAB — COMPREHENSIVE METABOLIC PANEL
ALK PHOS: 120 U/L — AB (ref 39–117)
ALT: 21 U/L (ref 0–35)
AST: 21 U/L (ref 0–37)
Albumin: 3 g/dL — ABNORMAL LOW (ref 3.5–5.2)
Anion gap: 11 (ref 5–15)
BUN: 21 mg/dL (ref 6–23)
CO2: 28 meq/L (ref 19–32)
Calcium: 9.7 mg/dL (ref 8.4–10.5)
Chloride: 100 mEq/L (ref 96–112)
Creatinine, Ser: 1.07 mg/dL (ref 0.50–1.10)
GFR calc Af Amer: 52 mL/min — ABNORMAL LOW (ref >90)
GFR, EST NON AFRICAN AMERICAN: 45 mL/min — AB (ref >90)
GLUCOSE: 141 mg/dL — AB (ref 70–99)
POTASSIUM: 4.1 meq/L (ref 3.7–5.3)
SODIUM: 139 meq/L (ref 137–147)
TOTAL PROTEIN: 6.7 g/dL (ref 6.0–8.3)
Total Bilirubin: 0.3 mg/dL (ref 0.3–1.2)

## 2014-08-08 LAB — URINALYSIS, ROUTINE W REFLEX MICROSCOPIC
Bilirubin Urine: NEGATIVE
Glucose, UA: NEGATIVE mg/dL
Hgb urine dipstick: NEGATIVE
Ketones, ur: NEGATIVE mg/dL
Nitrite: POSITIVE — AB
PH: 7 (ref 5.0–8.0)
Protein, ur: NEGATIVE mg/dL
SPECIFIC GRAVITY, URINE: 1.012 (ref 1.005–1.030)
Urobilinogen, UA: 0.2 mg/dL (ref 0.0–1.0)

## 2014-08-08 LAB — URINE MICROSCOPIC-ADD ON

## 2014-08-08 LAB — CBG MONITORING, ED: GLUCOSE-CAPILLARY: 120 mg/dL — AB (ref 70–99)

## 2014-08-08 MED ORDER — LEVOFLOXACIN IN D5W 750 MG/150ML IV SOLN
750.0000 mg | Freq: Once | INTRAVENOUS | Status: AC
  Administered 2014-08-08: 750 mg via INTRAVENOUS
  Filled 2014-08-08: qty 150

## 2014-08-08 MED ORDER — ONDANSETRON HCL 4 MG PO TABS: 4.0000 mg | ORAL_TABLET | Freq: Four times a day (QID) | ORAL | Status: DC | PRN

## 2014-08-08 MED ORDER — ENOXAPARIN SODIUM 40 MG/0.4ML ~~LOC~~ SOLN
40.0000 mg | SUBCUTANEOUS | Status: DC
  Administered 2014-08-08 – 2014-08-11 (×4): 40 mg via SUBCUTANEOUS
  Filled 2014-08-08 (×7): qty 0.4

## 2014-08-08 MED ORDER — VERAPAMIL HCL 120 MG PO TABS
60.0000 mg | ORAL_TABLET | Freq: Two times a day (BID) | ORAL | Status: DC
  Administered 2014-08-08 – 2014-08-12 (×8): 60 mg via ORAL
  Filled 2014-08-08 (×10): qty 0.5

## 2014-08-08 MED ORDER — CYCLOBENZAPRINE HCL 5 MG PO TABS
5.0000 mg | ORAL_TABLET | Freq: Once | ORAL | Status: AC
  Administered 2014-08-08: 5 mg via ORAL
  Filled 2014-08-08: qty 1

## 2014-08-08 MED ORDER — ASPIRIN EC 81 MG PO TBEC
81.0000 mg | DELAYED_RELEASE_TABLET | Freq: Every evening | ORAL | Status: DC
  Administered 2014-08-08 – 2014-08-11 (×4): 81 mg via ORAL
  Filled 2014-08-08 (×6): qty 1

## 2014-08-08 MED ORDER — POTASSIUM CHLORIDE CRYS ER 10 MEQ PO TBCR
10.0000 meq | EXTENDED_RELEASE_TABLET | Freq: Every day | ORAL | Status: DC
  Administered 2014-08-08 – 2014-08-11 (×4): 10 meq via ORAL
  Filled 2014-08-08 (×4): qty 1

## 2014-08-08 MED ORDER — SERTRALINE HCL 50 MG PO TABS
50.0000 mg | ORAL_TABLET | Freq: Every day | ORAL | Status: DC
  Administered 2014-08-08 – 2014-08-12 (×5): 50 mg via ORAL
  Filled 2014-08-08 (×5): qty 1

## 2014-08-08 MED ORDER — ONDANSETRON HCL 4 MG/2ML IJ SOLN
4.0000 mg | Freq: Four times a day (QID) | INTRAMUSCULAR | Status: DC | PRN
  Administered 2014-08-11: 4 mg via INTRAVENOUS
  Filled 2014-08-08: qty 2

## 2014-08-08 MED ORDER — BENAZEPRIL HCL 40 MG PO TABS
40.0000 mg | ORAL_TABLET | Freq: Every day | ORAL | Status: DC
  Administered 2014-08-09 – 2014-08-12 (×4): 40 mg via ORAL
  Filled 2014-08-08 (×5): qty 1

## 2014-08-08 MED ORDER — ALBUTEROL SULFATE (2.5 MG/3ML) 0.083% IN NEBU
2.5000 mg | INHALATION_SOLUTION | Freq: Four times a day (QID) | RESPIRATORY_TRACT | Status: DC | PRN
  Administered 2014-08-10 – 2014-08-11 (×2): 2.5 mg via RESPIRATORY_TRACT
  Filled 2014-08-08 (×2): qty 3

## 2014-08-08 MED ORDER — TIOTROPIUM BROMIDE MONOHYDRATE 18 MCG IN CAPS
18.0000 ug | ORAL_CAPSULE | Freq: Every day | RESPIRATORY_TRACT | Status: DC
  Administered 2014-08-09 – 2014-08-12 (×4): 18 ug via RESPIRATORY_TRACT
  Filled 2014-08-08 (×2): qty 5

## 2014-08-08 MED ORDER — METOPROLOL SUCCINATE ER 25 MG PO TB24
25.0000 mg | ORAL_TABLET | Freq: Every day | ORAL | Status: DC
  Administered 2014-08-09 – 2014-08-12 (×4): 25 mg via ORAL
  Filled 2014-08-08 (×4): qty 1

## 2014-08-08 MED ORDER — LEVOFLOXACIN IN D5W 750 MG/150ML IV SOLN: 750.0000 mg | INTRAVENOUS | Status: DC

## 2014-08-08 MED ORDER — BUDESONIDE 0.5 MG/2ML IN SUSP
0.5000 mg | Freq: Two times a day (BID) | RESPIRATORY_TRACT | Status: DC
  Administered 2014-08-08 – 2014-08-12 (×8): 0.5 mg via RESPIRATORY_TRACT
  Filled 2014-08-08 (×8): qty 2

## 2014-08-08 MED ORDER — CEFTRIAXONE SODIUM 1 G IJ SOLR
1.0000 g | Freq: Once | INTRAMUSCULAR | Status: AC
  Administered 2014-08-08: 1 g via INTRAVENOUS
  Filled 2014-08-08: qty 10

## 2014-08-08 MED ORDER — FUROSEMIDE 40 MG PO TABS
40.0000 mg | ORAL_TABLET | Freq: Every day | ORAL | Status: DC
  Administered 2014-08-08 – 2014-08-12 (×5): 40 mg via ORAL
  Filled 2014-08-08 (×5): qty 1

## 2014-08-08 MED ORDER — SODIUM CHLORIDE 0.9 % IV SOLN
INTRAVENOUS | Status: DC
Start: 2014-08-08 — End: 2014-08-10
  Administered 2014-08-08 – 2014-08-10 (×2): via INTRAVENOUS

## 2014-08-08 MED ORDER — OXYCODONE-ACETAMINOPHEN 5-325 MG PO TABS
1.0000 | ORAL_TABLET | Freq: Four times a day (QID) | ORAL | Status: DC | PRN
  Administered 2014-08-08 – 2014-08-12 (×6): 1 via ORAL
  Filled 2014-08-08 (×8): qty 1

## 2014-08-08 MED ORDER — OXYCODONE-ACETAMINOPHEN 5-325 MG PO TABS
1.0000 | ORAL_TABLET | Freq: Once | ORAL | Status: AC
  Administered 2014-08-08: 1 via ORAL
  Filled 2014-08-08: qty 1

## 2014-08-08 NOTE — H&P (Signed)
[facility] History and Physical  Adrienne [name] RRN:[id number] DOB: 31-Oct-1923 DOA: 08/08/2014  Referring physician: Dr Adrienne [name] PCP: Adrienne Sheller, MD   Chief Complaint: AMS  HPI: Adrienne [name] is a 80 y.o. female  Family left prior to pt evaluation and not responding to phone calls. Pt not altered at time of evaluation.  Pt reports feeling poorly starting 3 days ago. Primary symptom was frequency and foul smelling urine. Denies dysuria, back pain, abd pain, fevers. Per report from ED physician, pt has been intermittently altered for the past 2-3 days, w/ poor appetite and increased fatigue. Also noted by family to have worsening physical capabilties   Review of Systems:  Per HPI w/ all other systems negative Constitutional:  No weight loss, night sweats, Fevers, chills,  HEENT:  No headaches, Difficulty swallowing,Tooth/dental problems,Sore throat,  No sneezing, itching, ear ache, nasal congestion, post nasal drip,  Cardio-vascular:  No chest pain, Orthopnea, PND, swelling in lower extremities, anasarca, dizziness, palpitations  GI:  No heartburn, indigestion, abdominal pain, nausea, vomiting, diarrhea, change in bowel habits, Resp:  No shortness of breath with exertion or at rest. No excess mucus, no productive cough, No non-productive cough, No coughing up of blood.No change in color of mucus.No wheezing.No chest wall deformity  Skin:  no rash or lesions.  GU:  Per HPI Musculoskeletal:  No joint pain or swelling. No decreased range of motion. No back pain.  Psych:  No change in mood or affect. No depression or anxiety. No memory loss.   Past Medical History  Diagnosis Date  . COPD (chronic obstructive pulmonary disease)   . Arthritis   . Cancer   . Breast cancer     right  . Hypertension   . AKI (acute kidney injury) 10/01/2013  . Shortness of breath   . Diabetes mellitus without complication     TYPE 2   Past Surgical History  Procedure  Laterality Date  . Laparoscopic hysterectomy    . Appendectomy    . Laparoscopic cholecystectomy    . Breast surgery Right lumpectomy   Social History:  reports that she quit smoking about 34 years ago. Her smoking use included Cigarettes. She has a 60 pack-year smoking history. She has never used smokeless tobacco. She reports that she does not drink alcohol or use illicit drugs.  Allergies  Allergen Reactions  . Tetanus Toxoids Swelling  . Latex Rash  . Penicillins Rash    Family History  Problem Relation Age of Onset  . Lung cancer Father   . Heart disease Brother      Prior to Admission medications   Medication Sig Start Date End Date Taking? Authorizing Provider  acetaminophen (TYLENOL) 500 MG tablet Take 1,000 mg by mouth every 6 (six) hours as needed for mild pain (back pain).    Yes Historical Provider, MD  albuterol (PROVENTIL HFA;VENTOLIN HFA) 108 (90 BASE) MCG/ACT inhaler Inhale 2 puffs into the lungs every 6 (six) hours as needed for wheezing or shortness of breath. 10/04/13  Yes Modena Jansky, MD  albuterol (PROVENTIL) (2.5 MG/3ML) 0.083% nebulizer solution Take 2.5 mg by nebulization every 6 (six) hours.   Yes Historical Provider, MD  aspirin EC 81 MG tablet Take 81 mg by mouth every evening.   Yes Historical Provider, MD  benazepril (LOTENSIN) 40 MG tablet Take 40 mg by mouth daily.   Yes Historical Provider, MD  budesonide (PULMICORT) 0.5 MG/2ML nebulizer solution Take 0.5 mg by nebulization 2 (two) times  daily.   Yes Historical Provider, MD  CALCIUM PO Take 1 tablet by mouth at bedtime.    Yes Historical Provider, MD  Cholecalciferol (VITAMIN D PO) Take 1 capsule by mouth every evening.   Yes Historical Provider, MD  diphenhydramine-acetaminophen (TYLENOL PM) 25-500 MG TABS Take 1.5 tablets by mouth at bedtime.   Yes Historical Provider, MD  furosemide (LASIX) 40 MG tablet Take 40 mg by mouth daily.    Yes Historical Provider, MD  Ketotifen Fumarate (ALAWAY OP)  Apply 1 drop to eye 2 (two) times daily as needed (itchy eyes). Both eyes For itchy eyes   Yes Historical Provider, MD  Magnesium 250 MG TABS Take 1 tablet by mouth at bedtime.    Yes Historical Provider, MD  metoprolol succinate (TOPROL-XL) 50 MG 24 hr tablet Take 25 mg by mouth daily. Take with or immediately following a meal.   Yes Historical Provider, MD  Multiple Vitamins-Minerals (PRESERVISION AREDS) CAPS Take 1 capsule by mouth 2 (two) times daily.   Yes Historical Provider, MD  Omega-3 Fatty Acids (FISH OIL PO) Take 1 capsule by mouth every evening.   Yes Historical Provider, MD  potassium chloride (K-DUR,KLOR-CON) 10 MEQ tablet Take 10 mEq by mouth at bedtime.    Yes Historical Provider, MD  promethazine (PHENERGAN) 25 MG tablet Take 12.5 mg by mouth every 6 (six) hours as needed for nausea or vomiting (nausea).   Yes Historical Provider, MD  sertraline (ZOLOFT) 50 MG tablet Take 50 mg by mouth daily.  10/07/13  Yes Historical Provider, MD  tiotropium (SPIRIVA HANDIHALER) 18 MCG inhalation capsule Place 1 capsule (18 mcg total) into inhaler and inhale daily. 10/04/13  Yes Modena Jansky, MD  verapamil (CALAN) 120 MG tablet Take 60 mg by mouth 2 (two) times daily.    Yes Historical Provider, MD  oxyCODONE (OXY IR/ROXICODONE) 5 MG immediate release tablet Take 1 tablet (5 mg total) by mouth every 4 (four) hours as needed for moderate pain. 05/08/14   Charlynne Cousins, MD   Physical Exam: Filed Vitals:   08/08/14 1507 08/08/14 1508 08/08/14 1529 08/08/14 1735  BP: 155/49  155/49 168/59  Pulse: 90  84 82  Temp: 98.2 F (36.8 C)     TempSrc: Oral     Resp: 16  18 23   SpO2: 88% 92% 94% 95%    Wt Readings from Last 3 Encounters:  05/08/14 86.773 kg (191 lb 4.8 oz)  01/07/14 83.462 kg (184 lb)  12/03/13 87.726 kg (193 lb 6.4 oz)    General:  Appears calm and comfortable Eyes:  PERRL, normal lids, irises & conjunctiva ENT:  grossly normal hearing, lips & tongue Neck:  no LAD,  masses or thyromegaly Cardiovascular: faint heart sounds, RRR, no m/r/g. No LE edema. Telemetry:  SR, no arrhythmias  Respiratory:  CTA bilaterally, no w/r/r. Normal respiratory effort. Abdomen:  soft, ntnd Skin:  no rash or induration seen on limited exam Musculoskeletal:  grossly normal tone BUE/BLE, no CVA tenderness.  Psychiatric:  grossly normal mood and affect, speech fluent and appropriate Neurologic:  grossly non-focal.          Labs on Admission:  Basic Metabolic Panel:  Recent Labs Lab 08/08/14 1537  NA 139  K 4.1  CL 100  CO2 28  GLUCOSE 141*  BUN 21  CREATININE 1.07  CALCIUM 9.7   Liver Function Tests:  Recent Labs Lab 08/08/14 1537  AST 21  ALT 21  ALKPHOS 120*  BILITOT 0.3  PROT 6.7  ALBUMIN 3.0*   No results for input(s): LIPASE, AMYLASE in the last 168 hours. No results for input(s): AMMONIA in the last 168 hours. CBC:  Recent Labs Lab 08/08/14 1537  WBC 13.7*  HGB 13.3  HCT 43.0  MCV 95.6  PLT 181   Cardiac Enzymes: No results for input(s): CKTOTAL, CKMB, CKMBINDEX, TROPONINI in the last 168 hours.  BNP (last 3 results)  Recent Labs  10/01/13 1116 12/03/13 1416  PROBNP 1521.0* 626.0*   CBG:  Recent Labs Lab 08/08/14 1556  GLUCAP 120*    Radiological Exams on Admission: Dg Chest 2 View  08/08/2014   CLINICAL DATA:  Fall yesterday with pain and fever  EXAM: CHEST  2 VIEW  COMPARISON:  05/19/2014  FINDINGS: Cardiac shadow remains mildly enlarged. Interstitial markings are again seen and stable. No focal confluent infiltrate is noted. No sizable effusion is seen.  IMPRESSION: Chronic interstitial changes without acute infiltrate.   Electronically Signed   By: Inez Catalina M.D.   On: 08/08/2014 17:37   Dg Lumbar Spine Complete  08/08/2014   CLINICAL DATA:  Fall yesterday morning. Low midline back pain. Initial encounter.  EXAM: LUMBAR SPINE - COMPLETE 4+ VIEW  COMPARISON:  05/07/2014.  FINDINGS: Five lumbar type vertebral  bodies. X stop device present at L4-L5. Grade I anterolisthesis of L4 on L5 measuring 8 mm. Grade I retrolisthesis of L3 on L4 is unchanged compared to prior. Lower lumbar facet arthrosis. Lumbar vertebral body height is preserved. Concavity is superior endplates of L1 and L2 is probably due to oblique projection on the lateral view. Aortoiliac atherosclerosis.  Compared to the prior exam 05/07/2014, there is no interval change.  Severe bilateral sacroiliac joint degenerative disease. Cholecystectomy clips are present in the right upper quadrant.  IMPRESSION: 1. No acute abnormality or interval change. 2. L4-L5 X stop device with grade I anterolisthesis.   Electronically Signed   By: Dereck Ligas M.D.   On: 08/08/2014 17:38   Ct Head Wo Contrast  08/08/2014   CLINICAL DATA:  Altered mental status, fall.  EXAM: CT HEAD WITHOUT CONTRAST  TECHNIQUE: Contiguous axial images were obtained from the base of the skull through the vertex without intravenous contrast.  COMPARISON:  CT scan of May 19, 2014.  FINDINGS: Bony calvarium appears intact. 2 cm calcified meningioma is noted in the left posterior fossa which is unchanged compared to prior exam. Mild diffuse cortical atrophy is noted. Mild chronic ischemic white matter disease is noted. No mass effect or midline shift is noted. Ventricular size is within normal limits. There is no evidence of hemorrhage or acute infarction.  IMPRESSION: Stable calcified left cerebellar meningioma compared to prior exam. Mild diffuse cortical atrophy. Mild chronic ischemic white matter disease. No acute intracranial abnormality seen.   Electronically Signed   By: Sabino Dick M.D.   On: 08/08/2014 17:07    EKG: Independently reviewed. NSR, no change from previous. No ischemia  Assessment/Plan Principal Problem:   Altered mental status Active Problems:   Hypertension   COPD (chronic obstructive pulmonary disease)   Weakness   Chronic diastolic heart failure   UTI  (urinary tract infection)   Depression  Altered mental status: likely secondary to UTI. CT head w/o acute process noted. Baseline demential likely contributing. Currently at baseline mentation - nml. Foley placed by ED. Ceftiraxone given in ED - [place] - f/u UCX - IVF NS 61ml/hr - Change CTX to Levaquin  Physical deconditioning:  progressive loss of abilities to perform ADLs. May need placement - PT/OT consulted.   HTN: slightly elevated on admission - benazepril, metoprolol, verapamil  Diastolic heart failure: Chrnoic w/o exacerbation. Last Echo 10/02/13. EF 55-60% and Grade II diastolic failure - Monitor - continue lasix 40mg  po, benazepril, metoprolol  COPD: no sign of exacerbation. On 2L [place] at home PRN - O2 PRN - continue pulmicort, spiriva  Depression:  - continue zoloft  Code Status: FUll DVT Prophylaxis: Lovenox Family Communication: NOne - attmepted to contact family by phone Disposition Plan: pending imrpovement and evaluation for possible placement  Time spent: > 70 min    [place] [email or web address] Password [email or web address]

## 2014-08-08 NOTE — Progress Notes (Signed)
  CARE MANAGEMENT ED NOTE 08/08/2014  Patient:  Adrienne [name], Adrienne [name]   Account Number:  [id number]  Date Initiated:  08/08/2014  Documentation initiated by:  Livia Snellen  Subjective/Objective Assessment:   Patient presents to Ed with visual hallucinations and fall at home.     Subjective/Objective Assessment Detail:   Patient with pmhx of: COPD, arthritis, breast cancer, HTN, shortness of breath, DM, acute kidney injury      Action/Plan:   Action/Plan Detail:   Anticipated DC Date:       Status Recommendation to Physician:   Result of Recommendation:    Other ED [place]  Other  PCP issues   [facility] Choice  HOME HEALTH   Choice offered to / List presented to:  C-4 Adult Children         [facility].    Status of service:  Completed, signed off  ED Comments:   ED Comments Detail:  EDCM spoke to patient and her daughter at bedside.  Patient lives at home with her daughter and son in law.  Patient was receiving home health services with [facility] but has been discharged about 2-3 weeks ago per patient's daughter. Patient's daughter was very pleased with [facility] especially Opal Sidles the physical therapist.  Patient has a walker, hospital bed with side rails, wheelchair, bedside commode, shower chair and home oxygen.  Patient wears oxygen at night at 2 liters supplied by [employer].  Patient also has a nebulizer machine at home.  Patient needs assistance with her ADL's.  Patient's daughter confirms her pcp is [name].  Patient's daughter reports they placed a baby monitor in the patient's bedroom at night to alert them when she is getting up.  Patient's daughter reports they brought the patient to the ED today because the patient began hallucinating at home, see people in the home and things on the floor.  If patient is discharged this evening, patient's family would like patient to be set up with [facility] again for home health  services.  No further EDCM needs at this time.

## 2014-08-08 NOTE — ED Notes (Signed)
Pt sleeping upon pain reassessment. Did not disturb patient.

## 2014-08-08 NOTE — ED Notes (Signed)
Per EMS daughter states pt hallucinating, not sleeping Friday and Saturday. Slept all day yesterday, fell yesterday, landing on bottom. Complaints of low back pain and foul smelling urine.

## 2014-08-08 NOTE — ED Notes (Signed)
Pt transported to CT ?

## 2014-08-08 NOTE — ED Notes (Signed)
Found ulceration on bony prominence of second toe, right foot. Stage two pressure, pink area with open red central ulceration. No obvious drainage observed. Pt complained of pain at site. Placed bandage over area and alerted MD.

## 2014-08-08 NOTE — ED Notes (Signed)
Daughter - [place]   [phone]

## 2014-08-08 NOTE — ED Notes (Signed)
Bed: BO47 Expected date:  Expected time:  Means of arrival:  Comments: Ems- elderly, possible hallucinations for several days, possible UTI

## 2014-08-08 NOTE — ED Provider Notes (Signed)
Medical screening examination/treatment/procedure(s) were conducted as a shared visit with non-physician practitioner(s) and myself.  I personally evaluated the patient during the encounter.   EKG Interpretation   Date/Time:  Monday August 08 2014 16:42:14 EST Ventricular Rate:  82 PR Interval:  195 QRS Duration: 88 QT Interval:  386 QTC Calculation: 451 R Axis:   21 Text Interpretation:  Sinus rhythm No significant change since last  tracing Confirmed by Mlissa Tamayo  MD, Dalisa Forrer ([id number]) on 08/08/2014 6:59:09 PM     Patient here with altered mental status 3 days. Urinalysis consistent with infection. Will start patient on antibiotics and admitted to the hospital  Leota Jacobsen, MD 08/08/14 1859

## 2014-08-08 NOTE — Progress Notes (Signed)
ANTIBIOTIC CONSULT NOTE - INITIAL  Pharmacy Consult for levofloxacin Indication: UTI  Allergies  Allergen Reactions  . Tetanus Toxoids Swelling  . Latex Rash  . Penicillins Rash    Patient Measurements: Wt=86.8kg on 05/08/14 IBW: 54.7kg ABW: 68kg  Vital Signs: Temp: 98.2 F (36.8 C) (11/23 1507) Temp Source: Oral (11/23 1507) BP: 107/45 mmHg (11/23 2117) Pulse Rate: 83 (11/23 2117)  Labs:  Recent Labs  08/08/14 1537  WBC 13.7*  HGB 13.3  PLT 181  CREATININE 1.07    Medical History: Past Medical History  Diagnosis Date  . COPD (chronic obstructive pulmonary disease)   . Arthritis   . Cancer   . Breast cancer     right  . Hypertension   . AKI (acute kidney injury) 10/01/2013  . Shortness of breath   . Diabetes mellitus without complication     TYPE 2    Medications:  Scheduled:  . aspirin EC  81 mg Oral QPM  . [START ON 08/09/2014] benazepril  40 mg Oral Daily  . budesonide  0.5 mg Nebulization BID  . cefTRIAXone (ROCEPHIN)  IV  1 g Intravenous Once  . enoxaparin (LOVENOX) injection  40 mg Subcutaneous Q24H  . furosemide  40 mg Oral Daily  . [START ON 08/09/2014] metoprolol succinate  25 mg Oral Daily  . potassium chloride  10 mEq Oral QHS  . sertraline  50 mg Oral Daily  . [START ON 08/09/2014] tiotropium  18 mcg Inhalation Daily  . verapamil  60 mg Oral BID   Infusions:  . sodium chloride     Assessment: 22 yoF admitted 11/23 with AMS, though secondary to UTI. Noted patient received 1 dose of ceftriaxone. Pharmacy is consulted to dose levofloxacin for UTI. Noted enterococcus on urine culture from 05/19/14, due to this will start levofloxacin tonight.  Antiinfectives  11/23 >> ceftriaxone x1 11/23 >> levofloxacin >>    Labs / vitals Tmax: afebrile WBCs: 13.7 Renal: SCr 1.07 (at patient's baseline), CrCl 39 ml/min CG, 41 ml/min N  Microbiology 11/23 blood x2: IP 11/23 urine: IP (UA + for UTI)   Goal of Therapy:  levofloxacin per  indication and renal function  Plan:  - levofloxacin 750mg  IV q48h - follow-up clinical course, culture results, renal function - follow-up antibiotic de-escalation and length of therapy  Thank you for the consult.  Currie Paris, PharmD, BCPS Pager: [phone] Pharmacy: [phone] 08/08/2014 10:04 PM

## 2014-08-09 DIAGNOSIS — R531 Weakness: Secondary | ICD-10-CM

## 2014-08-09 DIAGNOSIS — N390 Urinary tract infection, site not specified: Principal | ICD-10-CM

## 2014-08-09 LAB — COMPREHENSIVE METABOLIC PANEL
ALT: 19 U/L (ref 0–35)
ANION GAP: 11 (ref 5–15)
AST: 19 U/L (ref 0–37)
Albumin: 2.8 g/dL — ABNORMAL LOW (ref 3.5–5.2)
Alkaline Phosphatase: 115 U/L (ref 39–117)
BUN: 23 mg/dL (ref 6–23)
CALCIUM: 9.1 mg/dL (ref 8.4–10.5)
CO2: 29 mEq/L (ref 19–32)
Chloride: 99 mEq/L (ref 96–112)
Creatinine, Ser: 1.14 mg/dL — ABNORMAL HIGH (ref 0.50–1.10)
GFR calc non Af Amer: 42 mL/min — ABNORMAL LOW (ref >90)
GFR, EST AFRICAN AMERICAN: 48 mL/min — AB (ref >90)
GLUCOSE: 96 mg/dL (ref 70–99)
Potassium: 4 mEq/L (ref 3.7–5.3)
SODIUM: 139 meq/L (ref 137–147)
TOTAL PROTEIN: 6.5 g/dL (ref 6.0–8.3)
Total Bilirubin: 0.2 mg/dL — ABNORMAL LOW (ref 0.3–1.2)

## 2014-08-09 LAB — CBC
HCT: 39.3 % (ref 36.0–46.0)
HEMOGLOBIN: 12.5 g/dL (ref 12.0–15.0)
MCH: 30.2 pg (ref 26.0–34.0)
MCHC: 31.8 g/dL (ref 30.0–36.0)
MCV: 94.9 fL (ref 78.0–100.0)
Platelets: 207 10*3/uL (ref 150–400)
RBC: 4.14 MIL/uL (ref 3.87–5.11)
RDW: 15.7 % — ABNORMAL HIGH (ref 11.5–15.5)
WBC: 10.9 10*3/uL — ABNORMAL HIGH (ref 4.0–10.5)

## 2014-08-09 NOTE — Progress Notes (Addendum)
[facility]                                                                              Patient Demographics  Adrienne [name], is a 80 y.o. female, DOB - [birth year], QJF:[id number]  Admit date - 08/08/2014   Admitting Physician Waldemar Dickens, MD  Outpatient Primary MD for the patient is Thressa Sheller, MD  LOS - 1   Chief Complaint  Patient presents with  . Fall  . Altered Mental Status      Interim history 80 year old female with history of COPD, breast cancer, hypertension and diabetes are presented with altered mental status. Upon admission, it seems her encephalopathy had resolved. Patient was found to have urinary tract infection and started on Levaquin. Urine cultures pending. Patient also has some deconditioning with frequent falls. PT and OT consulted.  Assessment & Plan   Acute encephalopathy likely secondary to urinary tract infection -Resolved, patient appears to be at baseline, alert and oriented -CT of the head showed no acute intracranial process -UA: Many bacteria, 21-50 WBC, moderate leukocytes, positive nitrites -Pending urine culture, blood culture -Chest x-ray: No acute infiltrate  Physical deconditioning -PT and OT consulted  Hypertension -Continue benazepril, metoprolol, verapamil  Chronic diastolic heart failure -Last echocardiogram in January 2015 showed an EF of 55-60% with grade 2 diastolic dysfunction -Continue Lasix, benazepril, metoprolol -Monitor daily weights, intake and output  COPD -Appears stable, continue oxygen via nasal cannula -Patient states she wears oxygen at home at night -Continue Pulmicort and Spiriva  Depression -Continue Zoloft  Chronic Kidney Disease, Stage III -Creatinine appears to be stable  Code Status: Full  Family Communication: None bedside  Disposition Plan: Admitted, pending PT evaluation  Time Spent in minutes   30 minutes  Procedures  None  Consults   None  DVT Prophylaxis   Lovenox  Lab Results  Component Value Date   PLT 207 08/09/2014    Medications  Scheduled Meds: . aspirin EC  81 mg Oral QPM  . benazepril  40 mg Oral Daily  . budesonide  0.5 mg Nebulization BID  . enoxaparin (LOVENOX) injection  40 mg Subcutaneous Q24H  . furosemide  40 mg Oral Daily  . [START ON 08/10/2014] levofloxacin (LEVAQUIN) IV  750 mg Intravenous Q48H  . metoprolol succinate  25 mg Oral Daily  . potassium chloride  10 mEq Oral QHS  . sertraline  50 mg Oral Daily  . tiotropium  18 mcg Inhalation Daily  . verapamil  60 mg Oral BID   Continuous Infusions: . sodium chloride 75 mL/hr at 08/08/14 2223   PRN Meds:.albuterol, ondansetron **OR** ondansetron (ZOFRAN) IV, oxyCODONE-acetaminophen  Antibiotics    Anti-infectives    Start     Dose/Rate Route Frequency Ordered Stop   08/10/14 2200  levofloxacin (LEVAQUIN) IVPB 750 mg     750 mg100 mL/hr over 90 Minutes Intravenous Every 48 hours 08/08/14 2205     08/08/14 2230  levofloxacin (LEVAQUIN) IVPB 750 mg     750 mg100 mL/hr over 90 Minutes Intravenous  Once 08/08/14 2140 08/08/14 2353   08/08/14 1900  cefTRIAXone (ROCEPHIN) 1 g in dextrose 5 % 50 mL IVPB  1 g100 mL/hr over 30 Minutes Intravenous  Once 08/08/14 1852 08/08/14 2133        Subjective:   Adrienne [name] seen and examined today.  Patient states she was feeling much better today. She does not remember what occurred just today. She thought it could've been short of breath as to why she came into the hospital. Patient denies any urinary pain at this time. She denies any frequency or dysuria. She denies chest pain or shortness of breath, abdominal pain.  Objective:   Filed Vitals:   08/08/14 2121 08/08/14 2201 08/09/14 0425 08/09/14 1032  BP:  140/57 164/71   Pulse:  78 75   Temp:  98.4 F (36.9 C) 97.5 F (36.4 C)   TempSrc:  Oral Oral   Resp:  18 18   Height:  5\' 4"  (1.626 m)    Weight:  83.19 kg (183 lb 6.4 oz)    SpO2: 92% 95% 97% 90%     Wt Readings from Last 3 Encounters:  08/08/14 83.19 kg (183 lb 6.4 oz)  05/08/14 86.773 kg (191 lb 4.8 oz)  01/07/14 83.462 kg (184 lb)     Intake/Output Summary (Last 24 hours) at 08/09/14 1039 Last data filed at 08/09/14 0700  Gross per 24 hour  Intake 886.25 ml  Output    500 ml  Net 386.25 ml    Exam  General: Well developed, well nourished, NAD, appears stated age  HEENT: NCAT,  mucous membranes moist.   Neck: Supple, no JVD, no masses  Cardiovascular: S1 S2 auscultated, no rubs, murmurs or gallops. Regular rate and rhythm.  Respiratory: Clear to auscultation bilaterally with equal chest rise  Abdomen: Soft, nontender, nondistended, + bowel sounds  Extremities: warm dry without cyanosis clubbing or edema  Neuro: AAOx3, no focal deficits.  Able to move all ext with ease.  Psych: Appropriate mood and affect.  Data Review   Micro Results No results found for this or any previous visit (from the past 240 hour(s)).  Radiology Reports Dg Chest 2 View  08/08/2014   CLINICAL DATA:  Fall yesterday with pain and fever  EXAM: CHEST  2 VIEW  COMPARISON:  05/19/2014  FINDINGS: Cardiac shadow remains mildly enlarged. Interstitial markings are again seen and stable. No focal confluent infiltrate is noted. No sizable effusion is seen.  IMPRESSION: Chronic interstitial changes without acute infiltrate.   Electronically Signed   By: Inez Catalina M.D.   On: 08/08/2014 17:37   Dg Lumbar Spine Complete  08/08/2014   CLINICAL DATA:  Fall yesterday morning. Low midline back pain. Initial encounter.  EXAM: LUMBAR SPINE - COMPLETE 4+ VIEW  COMPARISON:  05/07/2014.  FINDINGS: Five lumbar type vertebral bodies. X stop device present at L4-L5. Grade I anterolisthesis of L4 on L5 measuring 8 mm. Grade I retrolisthesis of L3 on L4 is unchanged compared to prior. Lower lumbar facet arthrosis. Lumbar vertebral body height is preserved. Concavity is superior endplates of L1 and L2 is  probably due to oblique projection on the lateral view. Aortoiliac atherosclerosis.  Compared to the prior exam 05/07/2014, there is no interval change.  Severe bilateral sacroiliac joint degenerative disease. Cholecystectomy clips are present in the right upper quadrant.  IMPRESSION: 1. No acute abnormality or interval change. 2. L4-L5 X stop device with grade I anterolisthesis.   Electronically Signed   By: Dereck Ligas M.D.   On: 08/08/2014 17:38   Ct Head Wo Contrast  08/08/2014   CLINICAL DATA:  Altered  mental status, fall.  EXAM: CT HEAD WITHOUT CONTRAST  TECHNIQUE: Contiguous axial images were obtained from the base of the skull through the vertex without intravenous contrast.  COMPARISON:  CT scan of May 19, 2014.  FINDINGS: Bony calvarium appears intact. 2 cm calcified meningioma is noted in the left posterior fossa which is unchanged compared to prior exam. Mild diffuse cortical atrophy is noted. Mild chronic ischemic white matter disease is noted. No mass effect or midline shift is noted. Ventricular size is within normal limits. There is no evidence of hemorrhage or acute infarction.  IMPRESSION: Stable calcified left cerebellar meningioma compared to prior exam. Mild diffuse cortical atrophy. Mild chronic ischemic white matter disease. No acute intracranial abnormality seen.   Electronically Signed   By: Sabino Dick M.D.   On: 08/08/2014 17:07    CBC  Recent Labs Lab 08/08/14 1537 08/09/14 0500  WBC 13.7* 10.9*  HGB 13.3 12.5  HCT 43.0 39.3  PLT 181 207  MCV 95.6 94.9  MCH 29.6 30.2  MCHC 30.9 31.8  RDW 15.5 15.7*    Chemistries   Recent Labs Lab 08/08/14 1537 08/09/14 0500  NA 139 139  K 4.1 4.0  CL 100 99  CO2 28 29  GLUCOSE 141* 96  BUN 21 23  CREATININE 1.07 1.14*  CALCIUM 9.7 9.1  AST 21 19  ALT 21 19  ALKPHOS 120* 115  BILITOT 0.3 0.2*    ------------------------------------------------------------------------------------------------------------------ estimated creatinine clearance is 35.6 mL/min (by C-G formula based on Cr of 1.14). ------------------------------------------------------------------------------------------------------------------ No results for input(s): HGBA1C in the last 72 hours. ------------------------------------------------------------------------------------------------------------------ No results for input(s): CHOL, HDL, LDLCALC, TRIG, CHOLHDL, LDLDIRECT in the last 72 hours. ------------------------------------------------------------------------------------------------------------------ No results for input(s): TSH, T4TOTAL, T3FREE, THYROIDAB in the last 72 hours.  Invalid input(s): FREET3 ------------------------------------------------------------------------------------------------------------------ No results for input(s): VITAMINB12, FOLATE, FERRITIN, TIBC, IRON, RETICCTPCT in the last 72 hours.  Coagulation profile No results for input(s): INR, PROTIME in the last 168 hours.  No results for input(s): DDIMER in the last 72 hours.  Cardiac Enzymes No results for input(s): CKMB, TROPONINI, MYOGLOBIN in the last 168 hours.  Invalid input(s): CK ------------------------------------------------------------------------------------------------------------------ Invalid input(s): POCBNP    Novaleigh Kohlman D.O. on 08/09/2014 at 10:39 AM  Between 7am to 7pm - Pager - [phone]  After 7pm go to [email or web address] - password [email or web address]  And look for the night coverage person covering for me after hours  [facility] Office  [phone]

## 2014-08-09 NOTE — Evaluation (Signed)
Occupational Therapy Evaluation Patient Details Name: Adrienne [name] MRN: [id number] DOB: 01/04/1924 Today's Date: 08/09/2014    History of Present Illness Pt is 80 yo female admitted with weakness, fall and hallucinations at home.  Pt found to have UTI.  Pt with h/o COPD, HTN, SOB, DM and uses 2L of O2 at night.     Clinical Impression   Pt admitted with the above diagnosis and has the deficits listed below. Pt would benefit from cont OT to increase independence and safety with all her adls and decrease the burden of care on her daughter at d/c.    Follow Up Recommendations  Home health OT;Supervision/Assistance - 24 hour (SNF if pt does not progress as expected.)    Equipment Recommendations  None recommended by OT    Recommendations for Other Services       Precautions / Restrictions Precautions Precautions: Fall Precaution Comments: Pt also with admission in August with a fall. Restrictions Weight Bearing Restrictions: No Other Position/Activity Restrictions: Pt with very painful tailbone due to fall.  Nursing notified.      Mobility Bed Mobility Overal bed mobility: Needs Assistance Bed Mobility: Sidelying to Sit   Sidelying to sit: Min assist       General bed mobility comments: assist to get full into sitting.  Transfers Overall transfer level: Needs assistance Equipment used: 1 person hand held assist Transfers: Sit to/from Stand Sit to Stand: Min assist         General transfer comment: cues for hand placement    Balance Overall balance assessment: Needs assistance Sitting-balance support: Bilateral upper extremity supported;Feet supported Sitting balance-Leahy Scale: Good     Standing balance support: Bilateral upper extremity supported;During functional activity Standing balance-Leahy Scale: Poor Standing balance comment: Pt needs outside assist to remain standing. Pt held to therapist, walker or to sink when grooming.                              ADL Overall ADL's : Needs assistance/impaired Eating/Feeding: Set up;Sitting   Grooming: Set up;Wash/dry hands;Wash/dry face;Oral care;Standing Grooming Details (indicate cue type and reason): stood at sink for 6 minutes with min guard. Upper Body Bathing: Set up;Sitting   Lower Body Bathing: Moderate assistance;Sit to/from stand Lower Body Bathing Details (indicate cue type and reason): assist to reach lower legs and to stand to wash bottom. Upper Body Dressing : Minimal assistance;Sitting Upper Body Dressing Details (indicate cue type and reason): min assist with bra Lower Body Dressing: Moderate assistance;Sit to/from stand Lower Body Dressing Details (indicate cue type and reason): assist with socks and shoes(baseline) and assist to get pants started over feet and to stand and pull pant sup. Toilet Transfer: Minimal assistance;Ambulation Toilet Transfer Details (indicate cue type and reason): Pt walked 5 steps with HHA to [place] and Hygiene: Maximal assistance;Sit to/from stand Toileting - Clothing Manipulation Details (indicate cue type and reason): Pt cannot let go of walker to pull pants up without assist for balance.     Functional mobility during ADLs: Minimal assistance General ADL Comments: Pt did well overall with adls.  Pt has a great amount of pain in her tailbone with mobility.  Pt needs assist to reach lower legs but did before she cane in.  Pt has had a h/o falls and was in in August for a fall.  May need STSNF to build up strength and endurance before returning home unless daughter can  handle pt at home.      Vision                     Perception     Praxis      Pertinent Vitals/Pain Pain Assessment: 0-10 Pain Score: 7  Pain Location: tailbone Pain Descriptors / Indicators: Aching Pain Intervention(s): Monitored during session;Repositioned;Patient requesting pain meds-RN notified     Hand Dominance  Right   Extremity/Trunk Assessment Upper Extremity Assessment Upper Extremity Assessment: Overall WFL for tasks assessed   Lower Extremity Assessment Lower Extremity Assessment: Defer to PT evaluation   Cervical / Trunk Assessment Cervical / Trunk Assessment: Normal   Communication Communication Communication: No difficulties;HOH   Cognition Arousal/Alertness: Awake/alert Behavior During Therapy: WFL for tasks assessed/performed Overall Cognitive Status: History of cognitive impairments - at baseline       Memory: Decreased short-term memory             General Comments       Exercises       Shoulder Instructions      Home Living Family/patient expects to be discharged to:: Private residence Living Arrangements: Children;Other (Comment) (lives with daughter and son in law) Available Help at Discharge: Family;Available 24 hours/day Type of Home: House Home Access: Ramped entrance     Home Layout: One level     Bathroom Shower/Tub: Walk-in shower;Door   [employer] Toilet: Handicapped height Bathroom Accessibility: Yes How Accessible: Accessible via walker Home Equipment: Cane - single point;Walker - 2 wheels;Bedside commode;Shower seat;Walker - 4 wheels;Hospital bed   Additional Comments: Pt states she does not have a w/c      Prior Functioning/Environment Level of Independence: Needs assistance  Gait / Transfers Assistance Needed: walks with walker [place] in house. ADL's / Homemaking Assistance Needed: daugther does all cooking and cleaning.  Pt can bathe, dress (except socks and shoes), groom and toilet on her own.        OT Diagnosis: Generalized weakness;Acute pain   OT Problem List: Decreased strength;Decreased activity tolerance;Impaired balance (sitting and/or standing);Decreased cognition;Decreased knowledge of use of DME or AE;Decreased knowledge of precautions;Pain   OT Treatment/Interventions: Self-care/ADL training;DME and/or AE  instruction;Therapeutic activities    OT Goals(Current goals can be found in the care plan section) Acute Rehab OT Goals Patient Stated Goal: to get home and well. OT Goal Formulation: With patient Time For Goal Achievement: 08/23/14 Potential to Achieve Goals: Good ADL Goals Pt Will Perform Grooming: with supervision;standing Pt Will Perform Lower Body Bathing: with set-up;sit to/from stand Pt Will Perform Tub/Shower Transfer: with supervision;shower seat;rolling walker;Shower transfer Additional ADL Goal #1: Pt will all her LE except socks and shoes with supervision. Additional ADL Goal #2: Pt will toilet on comfort commode with S.  OT Frequency: Min 2X/week   Barriers to D/C:    family available 24/7 but there is a h/o falls in the house.       Co-evaluation              End of Session Nurse Communication: Patient requests pain meds;Mobility status  Activity Tolerance: Patient limited by pain Patient left: in chair;with call bell/phone within reach;with nursing/sitter in room   Time: 4174-0814 OT Time Calculation (min): 31 min Charges:  OT General Charges $OT Visit: 1 Procedure OT Evaluation $Initial OT Evaluation Tier I: 1 Procedure OT Treatments $Self Care/Home Management : 23-37 mins G-Codes:    Glenford Peers 22-Aug-2014, 9:37 AM  [phone]

## 2014-08-09 NOTE — Evaluation (Signed)
Physical Therapy Evaluation Patient Details Name: Adrienne [name] MRN: [id number] DOB: 12-14-1923 Today's Date: 08/09/2014   History of Present Illness  Pt is 80 yo female admitted with weakness, fall and hallucinations at home.  Pt found to have UTI.  Pt with h/o COPD, HTN, SOB, DM and uses 2L of O2 at night.    Clinical Impression  Patient  Is weak, noted dyspnea with mobility, sats at 90-91 on RA. No family present to discuss  Caregiver availability, pt reports daughter present . Patient may benefit from a short term rehab if family  Desires, if DC recommend HHPT. Pt will benefit from PT to address problems listed in note below.    Follow Up Recommendations Home health PT;SNF;Supervision/Assistance - 24 hour    Equipment Recommendations  None recommended by PT    Recommendations for Other Services  (no family present to discuss  SNF option, pt deferred to daughter to  make a desicion. pt is very  unsteady and will need  close assist for ambulation )     Precautions / Restrictions Precautions Precautions: Fall Precaution Comments: Pt also with admission in August with a fall.monitor sats Restrictions Other Position/Activity Restrictions: Pt with very painful tailbone due to fall.  Nursing notified.      Mobility  Bed Mobility   Bed Mobility: Sidelying to Sit;Sit to Sidelying   Sidelying to sit: Min assist     Sit to sidelying: HOB elevated General bed mobility comments: assist to get full into sitting. assist with legs onto bed   Transfers Overall transfer level: Needs assistance Equipment used: Rolling walker (2 wheeled) Transfers: Sit to/from Stand Sit to Stand: Min assist         General transfer comment: cues for hand placement, from bed and  toilet with rail  Ambulation/Gait Ambulation/Gait assistance: Min assist Ambulation Distance (Feet): 15 Feet (x2) Assistive device: Rolling walker (2 wheeled) Gait Pattern/deviations: Step-to pattern;Step-through  pattern;Antalgic Gait velocity: slow   General Gait Details: assist around obstacles,  Stairs            Wheelchair Mobility    Modified Rankin (Stroke Patients Only)       Balance           Standing balance support: No upper extremity supported;During functional activity Standing balance-Leahy Scale: Poor Standing balance comment: pulls up undies at toilet, wide base                             Pertinent Vitals/Pain Pain Assessment: Faces Faces Pain Scale: Hurts whole lot Pain Location: tailbone Pain Descriptors / Indicators: Discomfort;Aching;Sharp Pain Intervention(s): Monitored during session;Repositioned    Home Living Family/patient expects to be discharged to:: Private residence Living Arrangements: Children;Other (Comment) Available Help at Discharge: Family;Available 24 hours/day Type of Home: House       Home Layout: One level Home Equipment: Cane - single point;Walker - 2 wheels;Bedside commode;Shower seat;Walker - 4 wheels;Hospital bed Additional Comments: Pt states she does not have a w/c, reports daughter and son in law are disabled themselves    Prior Function Level of Independence: Needs assistance   Gait / Transfers Assistance Needed: walks with walker  in house.  ADL's / Homemaking Assistance Needed: daugther does all cooking and cleaning.  Pt can bathe, dress (except socks and shoes), groom and toilet on her own.        Hand Dominance        Extremity/Trunk Assessment  Upper Extremity Assessment: Defer to OT evaluation           Lower Extremity Assessment: Generalized weakness      Cervical / Trunk Assessment: Normal  Communication   Communication: No difficulties;HOH  Cognition Arousal/Alertness: Awake/alert Behavior During Therapy: WFL for tasks assessed/performed Overall Cognitive Status: History of cognitive impairments - at baseline       Memory: Decreased short-term memory               General Comments      Exercises        Assessment/Plan    PT Assessment Patient needs continued PT services  PT Diagnosis Difficulty walking;Acute pain;Generalized weakness   PT Problem List Decreased strength;Decreased activity tolerance;Decreased mobility;Decreased balance;Decreased safety awareness;Decreased knowledge of precautions;Decreased knowledge of use of DME;Pain  PT Treatment Interventions DME instruction;Gait training;Functional mobility training;Therapeutic activities;Therapeutic exercise;Patient/family education   PT Goals (Current goals can be found in the Care Plan section) Acute Rehab PT Goals Patient Stated Goal: to get home and well. PT Goal Formulation: With patient Time For Goal Achievement: 08/23/14 Potential to Achieve Goals: Good    Frequency Min 3X/week   Barriers to discharge        Co-evaluation               End of Session   Activity Tolerance: Patient limited by fatigue;Patient limited by pain Patient left: in bed;with call bell/phone within reach Nurse Communication: Mobility status         Time: 2836-6294 PT Time Calculation (min) (ACUTE ONLY): 30 min   Charges:   PT Evaluation $Initial PT Evaluation Tier I: 1 Procedure PT Treatments $Gait Training: 8-22 mins $Self Care/Home Management: 8-22   PT G Codes:          Claretha Cooper 08/09/2014, 4:37 PM Tresa Endo PT [phone]

## 2014-08-10 LAB — BASIC METABOLIC PANEL
Anion gap: 10 (ref 5–15)
BUN: 18 mg/dL (ref 6–23)
CO2: 27 mEq/L (ref 19–32)
Calcium: 9.5 mg/dL (ref 8.4–10.5)
Chloride: 100 mEq/L (ref 96–112)
Creatinine, Ser: 0.89 mg/dL (ref 0.50–1.10)
GFR calc Af Amer: 65 mL/min — ABNORMAL LOW (ref >90)
GFR calc non Af Amer: 56 mL/min — ABNORMAL LOW (ref >90)
GLUCOSE: 129 mg/dL — AB (ref 70–99)
POTASSIUM: 4 meq/L (ref 3.7–5.3)
SODIUM: 137 meq/L (ref 137–147)

## 2014-08-10 MED ORDER — POLYETHYLENE GLYCOL 3350 17 G PO PACK
17.0000 g | PACK | Freq: Every day | ORAL | Status: DC
  Administered 2014-08-10 – 2014-08-12 (×3): 17 g via ORAL
  Filled 2014-08-10 (×3): qty 1

## 2014-08-10 MED ORDER — SENNOSIDES-DOCUSATE SODIUM 8.6-50 MG PO TABS
2.0000 | ORAL_TABLET | Freq: Two times a day (BID) | ORAL | Status: DC
  Administered 2014-08-10 – 2014-08-12 (×5): 2 via ORAL
  Filled 2014-08-10 (×5): qty 2

## 2014-08-10 MED ORDER — LEVOFLOXACIN 750 MG PO TABS
750.0000 mg | ORAL_TABLET | Freq: Every day | ORAL | Status: DC
  Administered 2014-08-10 – 2014-08-12 (×3): 750 mg via ORAL
  Filled 2014-08-10 (×3): qty 1

## 2014-08-10 MED ORDER — ZOLPIDEM TARTRATE 5 MG PO TABS
5.0000 mg | ORAL_TABLET | Freq: Once | ORAL | Status: AC
  Administered 2014-08-10: 5 mg via ORAL
  Filled 2014-08-10: qty 1

## 2014-08-10 MED ORDER — HYDRALAZINE HCL 20 MG/ML IJ SOLN
5.0000 mg | Freq: Four times a day (QID) | INTRAMUSCULAR | Status: DC | PRN
  Administered 2014-08-10 – 2014-08-11 (×4): 5 mg via INTRAVENOUS
  Filled 2014-08-10 (×4): qty 1

## 2014-08-10 NOTE — Progress Notes (Signed)
Clinical Social Work Department BRIEF PSYCHOSOCIAL ASSESSMENT 08/10/2014  Patient:  Adrienne [name], Adrienne [name]     Account Number:  [id number]     Admit date:  08/08/2014  Clinical Social Worker:  Renold Genta  Date/Time:  08/10/2014 11:43 AM  Referred by:  Physician  Date Referred:  08/10/2014 Referred for  SNF Placement   Other Referral:   Interview type:  Family Other interview type:   patient's daughter, Adrienne [name] via phone    PSYCHOSOCIAL DATA Living Status:  ALONE Admitted from facility:   Level of care:   Primary support name:  Adrienne [name] (daughter) h#: [phone] c#: [phone] c#: [phone] Primary support relationship to patient:  CHILD, ADULT Degree of support available:   good    CURRENT CONCERNS Current Concerns  Post-Acute Placement   Other Concerns:    SOCIAL WORK ASSESSMENT / PLAN CSW reviewed PT evaluation recommending SNF at discharge.   Assessment/plan status:  Information/Referral to [employer] Other assessment/ plan:   Information/referral to community resources:   CSW completed FL2 and faxed information to [facility] - provided bed offers.    PATIENT'S/FAMILY'S RESPONSE TO PLAN OF CARE: Patient's daughter is agreeable with plan for SNF - accepted bed offer at [facility]. CSW confirmed with [facility] @ SNF that they would be able to take patient when ready.       Raynaldo Opitz, [facility] Clinical Social Worker cell #: [phone]

## 2014-08-10 NOTE — Progress Notes (Signed)
Occupational Therapy Treatment Patient Details Name: Adrienne [name] MRN: [id number] DOB: 07-05-1924 Today's Date: 08/10/2014    History of present illness Pt is 80 yo female admitted with weakness, fall and hallucinations at home.  Pt found to have UTI.  Pt with h/o COPD, HTN, SOB, DM and uses 2L of O2 at night.     OT comments  Pt is progressing toward OT goals.  She requires min A - mod A for BADLs.  02 sats on RA 91-96% throughout session.     Follow Up Recommendations  SNF    Equipment Recommendations  None recommended by OT    Recommendations for Other Services      Precautions / Restrictions Precautions Precautions: Fall Precaution Comments: Pt also with admission in August with a fall.monitor sats       Mobility Bed Mobility Overal bed mobility: Needs Assistance Bed Mobility: Sidelying to Sit;Sit to Sidelying   Sidelying to sit: Min assist     Sit to sidelying: Min guard General bed mobility comments: min A to lift trunk   Transfers Overall transfer level: Needs assistance Equipment used: Rolling walker (2 wheeled) Transfers: Sit to/from [facility] Sit to Stand: Min assist Stand pivot transfers: Min guard       General transfer comment: cues for hand placement and min A for balance.     Balance Overall balance assessment: Needs assistance Sitting-balance support: Feet supported Sitting balance-Leahy Scale: Good     Standing balance support: During functional activity Standing balance-Leahy Scale: Poor Standing balance comment: LOB posteriorly                    ADL       Grooming: Wash/dry hands;Oral care;Brushing hair;Minimal assistance;Standing Grooming Details (indicate cue type and reason): Pt with LOB x 1 while backing away from sink.  Required min A to recover                 Toilet Transfer: Minimal assistance;Stand-pivot;Ambulation;Comfort height toilet;Grab bars;RW   Toileting- Clothing Manipulation  and Hygiene: Minimal assistance;Sit to/from stand       Functional mobility during ADLs: Minimal assistance;Rolling walker General ADL Comments: Pt requires cues for walker safety.  Attempted to have her sit up in recliner at end of session, however unable to find a comfortable position for her to sit due to pain Low back and tailbone - attempted repositioning multiple times       Vision                     Perception     Praxis      Cognition   Behavior During Therapy: [facility] for tasks assessed/performed Overall Cognitive Status: History of cognitive impairments - at baseline                       Extremity/Trunk Assessment               Exercises     Shoulder Instructions       General Comments      Pertinent Vitals/ Pain       Pain Assessment: Faces Faces Pain Scale: Hurts even more Pain Location: tail bone Pain Descriptors / Indicators: Discomfort;Grimacing;Guarding Pain Intervention(s): Repositioned;Monitored during session  Home Living  Prior Functioning/Environment              Frequency Min 2X/week     Progress Toward Goals  OT Goals(current goals can now be found in the care plan section)     ADL Goals Pt Will Perform Grooming: with supervision;standing Pt Will Perform Lower Body Bathing: with set-up;sit to/from stand Pt Will Perform Tub/Shower Transfer: with supervision;shower seat;rolling walker;Shower transfer Additional ADL Goal #1: Pt will all her LE except socks and shoes with supervision. Additional ADL Goal #2: Pt will toilet on comfort commode with S.  Plan Discharge plan needs to be updated    Co-evaluation                 End of Session Equipment Utilized During Treatment: Rolling walker   Activity Tolerance Patient tolerated treatment well   Patient Left in bed;with call bell/phone within reach;with bed alarm set   Nurse Communication  Mobility status        Time: 1410-1452 OT Time Calculation (min): 42 min  Charges: OT General Charges $OT Visit: 1 Procedure OT Treatments $Self Care/Home Management : 23-37 mins $Therapeutic Activity: 8-22 mins  Klye Besecker M 08/10/2014, 3:06 PM

## 2014-08-10 NOTE — Plan of Care (Signed)
Problem: Phase I Progression Outcomes Goal: Initial discharge plan identified Outcome: Completed/Met Date Met:  08/10/14 Goal: Adequate I & O Outcome: Completed/Met Date Met:  08/10/14

## 2014-08-10 NOTE — Progress Notes (Signed)
[facility]                                                                              Patient Demographics  Adrienne [name], is a 80 y.o. female, DOB - 21-Sep-1923, HUD:[id number]  Admit date - 08/08/2014   Admitting Physician Waldemar Dickens, MD  Outpatient Primary MD for the patient is Thressa Sheller, MD  LOS - 2   Chief Complaint  Patient presents with  . Fall  . Altered Mental Status      Interim history 80 year old female with history of COPD, breast cancer, hypertension and diabetes are presented with altered mental status. Upon admission, it seems her encephalopathy had resolved. Patient was found to have urinary tract infection and started on Levaquin. Urine cultures pending. Patient also has some deconditioning with frequent falls. PT and OT consulted.  Assessment & Plan   Acute encephalopathy likely secondary to urinary tract infection -Resolved, patient appears to be at baseline, alert and oriented -CT of the head showed no acute intracranial process -UA: Many bacteria, 21-50 WBC, moderate leukocytes, positive nitrites -Pending urine culture, blood cultures -Chest x-ray: No acute infiltrate  Physical deconditioning -PT and OT consulted and recommended SNF placement.  Accelerated Hypertension -Continue benazepril, metoprolol, verapamil. Hydralazine added this am for uncontrolled BP, with out any symptoms.   Chronic diastolic heart failure -Last echocardiogram in January 2015 showed an EF of 55-60% with grade 2 diastolic dysfunction -Continue Lasix, benazepril, metoprolol -Monitor daily weights, intake and output I/O last 3 completed shifts: In: 1666.3 [P.O.:720; I.V.:946.3] Out: 2220 [Urine:2220] Total I/O In: 360 [P.O.:360] Out: 300 [Urine:300]     COPD -Appears stable, continue oxygen via nasal cannula -Patient states she wears oxygen at home at night -Continue Pulmicort and Spiriva  Depression -Continue Zoloft  Chronic Kidney Disease,  Stage III -Creatinine appears to be stable  Code Status: Full  Family Communication: None bedside  Disposition Plan: Admitted, pending PT evaluation  Time Spent in minutes   30 minutes  Procedures  None  Consults   None  DVT Prophylaxis  Lovenox  Lab Results  Component Value Date   PLT 207 08/09/2014    Medications  Scheduled Meds: . aspirin EC  81 mg Oral QPM  . benazepril  40 mg Oral Daily  . budesonide  0.5 mg Nebulization BID  . enoxaparin (LOVENOX) injection  40 mg Subcutaneous Q24H  . furosemide  40 mg Oral Daily  . levofloxacin (LEVAQUIN) IV  750 mg Intravenous Q48H  . metoprolol succinate  25 mg Oral Daily  . polyethylene glycol  17 g Oral Daily  . potassium chloride  10 mEq Oral QHS  . senna-docusate  2 tablet Oral BID  . sertraline  50 mg Oral Daily  . tiotropium  18 mcg Inhalation Daily  . verapamil  60 mg Oral BID   Continuous Infusions:   PRN Meds:.albuterol, hydrALAZINE, ondansetron **OR** ondansetron (ZOFRAN) IV, oxyCODONE-acetaminophen  Antibiotics    Anti-infectives    Start     Dose/Rate Route Frequency Ordered Stop   08/10/14 2200  levofloxacin (LEVAQUIN) IVPB 750 mg     750 mg100 mL/hr over 90 Minutes Intravenous Every 48 hours 08/08/14 2205  08/08/14 2230  levofloxacin (LEVAQUIN) IVPB 750 mg     750 mg100 mL/hr over 90 Minutes Intravenous  Once 08/08/14 2140 08/08/14 2353   08/08/14 1900  cefTRIAXone (ROCEPHIN) 1 g in dextrose 5 % 50 mL IVPB     1 g100 mL/hr over 30 Minutes Intravenous  Once 08/08/14 1852 08/08/14 2133        Subjective:   Maie Kesinger seen and examined today.  Patient states she was feeling much better today.  Patient denies any urinary pain at this time. She denies any frequency or dysuria. She denies chest pain or shortness of breath, abdominal pain.  Objective:   Filed Vitals:   08/09/14 2205 08/10/14 0607 08/10/14 0741 08/10/14 0914  BP: 165/88 185/78  205/79  Pulse: 87 78  79  Temp: 99.2 F (37.3  C) 98.2 F (36.8 C)    TempSrc: Oral Oral    Resp: 20 20    Height:      Weight:      SpO2: 93% 94% 97%     Wt Readings from Last 3 Encounters:  08/08/14 83.19 kg (183 lb 6.4 oz)  05/08/14 86.773 kg (191 lb 4.8 oz)  01/07/14 83.462 kg (184 lb)     Intake/Output Summary (Last 24 hours) at 08/10/14 1101 Last data filed at 08/10/14 1006  Gross per 24 hour  Intake   1140 ml  Output   2020 ml  Net   -880 ml    Exam  General: Well developed, well nourished, NAD, appears stated age  82: NCAT,  mucous membranes moist.   Neck: Supple, no JVD, no masses  Cardiovascular: S1 S2 auscultated, no rubs, murmurs or gallops. Regular rate and rhythm.  Respiratory: Clear to auscultation bilaterally with equal chest rise  Abdomen: Soft, nontender, nondistended, + bowel sounds  Extremities: warm dry without cyanosis clubbing or edema    Data Review   Micro Results No results found for this or any previous visit (from the past 240 hour(s)).  Radiology Reports Dg Chest 2 View  08/08/2014   CLINICAL DATA:  Fall yesterday with pain and fever  EXAM: CHEST  2 VIEW  COMPARISON:  05/19/2014  FINDINGS: Cardiac shadow remains mildly enlarged. Interstitial markings are again seen and stable. No focal confluent infiltrate is noted. No sizable effusion is seen.  IMPRESSION: Chronic interstitial changes without acute infiltrate.   Electronically Signed   By: Inez Catalina M.D.   On: 08/08/2014 17:37   Dg Lumbar Spine Complete  08/08/2014   CLINICAL DATA:  Fall yesterday morning. Low midline back pain. Initial encounter.  EXAM: LUMBAR SPINE - COMPLETE 4+ VIEW  COMPARISON:  05/07/2014.  FINDINGS: Five lumbar type vertebral bodies. X stop device present at L4-L5. Grade I anterolisthesis of L4 on L5 measuring 8 mm. Grade I retrolisthesis of L3 on L4 is unchanged compared to prior. Lower lumbar facet arthrosis. Lumbar vertebral body height is preserved. Concavity is superior endplates of L1 and L2 is  probably due to oblique projection on the lateral view. Aortoiliac atherosclerosis.  Compared to the prior exam 05/07/2014, there is no interval change.  Severe bilateral sacroiliac joint degenerative disease. Cholecystectomy clips are present in the right upper quadrant.  IMPRESSION: 1. No acute abnormality or interval change. 2. L4-L5 X stop device with grade I anterolisthesis.   Electronically Signed   By: Dereck Ligas M.D.   On: 08/08/2014 17:38   Ct Head Wo Contrast  08/08/2014   CLINICAL DATA:  Altered mental  status, fall.  EXAM: CT HEAD WITHOUT CONTRAST  TECHNIQUE: Contiguous axial images were obtained from the base of the skull through the vertex without intravenous contrast.  COMPARISON:  CT scan of May 19, 2014.  FINDINGS: Bony calvarium appears intact. 2 cm calcified meningioma is noted in the left posterior fossa which is unchanged compared to prior exam. Mild diffuse cortical atrophy is noted. Mild chronic ischemic white matter disease is noted. No mass effect or midline shift is noted. Ventricular size is within normal limits. There is no evidence of hemorrhage or acute infarction.  IMPRESSION: Stable calcified left cerebellar meningioma compared to prior exam. Mild diffuse cortical atrophy. Mild chronic ischemic white matter disease. No acute intracranial abnormality seen.   Electronically Signed   By: Sabino Dick M.D.   On: 08/08/2014 17:07    CBC  Recent Labs Lab 08/08/14 1537 08/09/14 0500  WBC 13.7* 10.9*  HGB 13.3 12.5  HCT 43.0 39.3  PLT 181 207  MCV 95.6 94.9  MCH 29.6 30.2  MCHC 30.9 31.8  RDW 15.5 15.7*    Chemistries   Recent Labs Lab 08/08/14 1537 08/09/14 0500  NA 139 139  K 4.1 4.0  CL 100 99  CO2 28 29  GLUCOSE 141* 96  BUN 21 23  CREATININE 1.07 1.14*  CALCIUM 9.7 9.1  AST 21 19  ALT 21 19  ALKPHOS 120* 115  BILITOT 0.3 0.2*    ------------------------------------------------------------------------------------------------------------------ estimated creatinine clearance is 35.6 mL/min (by C-G formula based on Cr of 1.14). ------------------------------------------------------------------------------------------------------------------ No results for input(s): HGBA1C in the last 72 hours. ------------------------------------------------------------------------------------------------------------------ No results for input(s): CHOL, HDL, LDLCALC, TRIG, CHOLHDL, LDLDIRECT in the last 72 hours. ------------------------------------------------------------------------------------------------------------------ No results for input(s): TSH, T4TOTAL, T3FREE, THYROIDAB in the last 72 hours.  Invalid input(s): FREET3 ------------------------------------------------------------------------------------------------------------------ No results for input(s): VITAMINB12, FOLATE, FERRITIN, TIBC, IRON, RETICCTPCT in the last 72 hours.  Coagulation profile No results for input(s): INR, PROTIME in the last 168 hours.  No results for input(s): DDIMER in the last 72 hours.  Cardiac Enzymes No results for input(s): CKMB, TROPONINI, MYOGLOBIN in the last 168 hours.  Invalid input(s): CK ------------------------------------------------------------------------------------------------------------------ Invalid input(s): POCBNP    Henchy Mccauley   08/10/2014 at 11:01 AM  Between 7am to 7pm - Pager - [phone]  After 7pm go to [email or web address] - password [email or web address]  And look for the night coverage person covering for me after hours  [facility] Office  [phone]

## 2014-08-10 NOTE — Progress Notes (Signed)
Pt BP 205/79, HR 70.  No complaints of chest pain.  MD notified.  Adm hydralazine 5 mg IV.  Rechecked BP 151/75.

## 2014-08-10 NOTE — Plan of Care (Signed)
Problem: Phase I Progression Outcomes Goal: OOB as tolerated unless otherwise ordered Outcome: Completed/Met Date Met:  08/10/14 Goal: Vital Signs stable- temperature less than 102 Outcome: Completed/Met Date Met:  08/10/14 Goal: Voiding-avoid urinary catheter unless indicated Outcome: Completed/Met Date Met:  08/10/14 Goal: Tolerating diet Outcome: Completed/Met Date Met:  08/10/14

## 2014-08-10 NOTE — Progress Notes (Signed)
[place] NOTE 08/10/2014  Patient:  IDY, RAWLING  Account Number:  [id number] Admit date:  08/08/2014  Clinical Social Worker:  Renold Genta  Date/time:  08/10/2014 11:46 AM  Clinical Social Work is seeking post-discharge placement for this patient at the following level of care:   SKILLED NURSING   (*CSW will update this form in Epic as items are completed)   08/10/2014  Patient/family provided with [place] Department of Clinical Social Work's list of facilities offering this level of care within the geographic area requested by the patient (or if unable, by the patient's family).  08/10/2014  Patient/family informed of their freedom to choose among providers that offer the needed level of care, that participate in Medicare, Medicaid or managed care program needed by the patient, have an available bed and are willing to accept the patient.  08/10/2014  Patient/family informed of [facility]' ownership interest in [facility], as well as of the fact that they are under no obligation to receive care at this facility.  PASARR submitted to EDS on 08/10/2014 PASARR number received on 08/10/2014  FL2 transmitted to all facilities in geographic area requested by pt/family on  08/10/2014 FL2 transmitted to all facilities within larger geographic area on   Patient informed that his/her managed care company has contracts with or will negotiate with  certain facilities, including the following:     Patient/family informed of bed offers received:  08/10/2014 Patient chooses bed at [facility] [place] Physician recommends and patient chooses bed at    Patient to be transferred to [place] on   Patient to be transferred to facility by  Patient and family notified of transfer on  Name of family member notified:    The following physician request were entered in  Epic:   Additional Comments:   Raynaldo Opitz, [place] Social Worker cell #: [phone]

## 2014-08-10 NOTE — Progress Notes (Addendum)
BP 185/78. Pt asymptomatic. NP on call paged. Instructed by NP to give morning dose of Lotensin and Metoprolol. early. Will continue to monitor closely.

## 2014-08-10 NOTE — Progress Notes (Signed)
ANTIBIOTIC CONSULT NOTE - FOLLOW UP  Pharmacy Consult for Levaquin  Indication: UTI  Allergies  Allergen Reactions  . Tetanus Toxoids Swelling  . Latex Rash  . Penicillins Rash    Patient Measurements: Height: 5\' 4"  (162.6 cm) Weight: 183 lb 6.4 oz (83.19 kg) IBW/kg (Calculated) : 54.7  Vital Signs: Temp: 98.2 F (36.8 C) (11/25 0607) Temp Source: Oral (11/25 0607) BP: 151/75 mmHg (11/25 1123) Pulse Rate: 70 (11/25 1123) Intake/Output from previous day: 11/24 0701 - 11/25 0700 In: 780 [P.O.:480; I.V.:300] Out: 1720 [Urine:1720] Intake/Output from this shift: Total I/O In: 360 [P.O.:360] Out: 600 [Urine:600]  Labs:  Recent Labs  08/08/14 1537 08/09/14 0500 08/10/14 1025  WBC 13.7* 10.9*  --   HGB 13.3 12.5  --   PLT 181 207  --   CREATININE 1.07 1.14* 0.89   Estimated Creatinine Clearance: 45.6 mL/min (by C-G formula based on Cr of 0.89). No results for input(s): VANCOTROUGH, VANCOPEAK, VANCORANDOM, GENTTROUGH, GENTPEAK, GENTRANDOM, TOBRATROUGH, TOBRAPEAK, TOBRARND, AMIKACINPEAK, AMIKACINTROU, AMIKACIN in the last 72 hours.   Microbiology: No results found for this or any previous visit (from the past 720 hour(s)).  Assessment: 43 yoF admitted 11/23 with AMS, though secondary to UTI. Patient received 1 dose of ceftriaxone. Pharmacy consulted to dose levofloxacin for UTI. Noted enterococcus on urine culture from 05/19/14.  11/23 >> ceftriaxone x1 11/23 >> levofloxacin >>   Tmax: afebrile WBCs: 13.7 > 10.9 Renal: SCr 0.89 (improved), CrCl 56 ml/min CG  11/23 blood x2: IP 11/23 urine: IP (UA + for UTI)  Today is day #3 levofloxacin 750mg  IV q48h for UTI.   Goal of Therapy:  Doses adjusted per renal function Eradication of infection  Plan:  Adjust Levaquin to 750 mg PO q24h for improved CrCl.  Also, switching to PO since patient has been afebrile, WBC improved, and patient tolerating PO meds/diet.  Hershal Coria 08/10/2014,1:07 PM

## 2014-08-11 MED ORDER — COLCHICINE 0.6 MG PO TABS: 0.6000 mg | ORAL_TABLET | Freq: Two times a day (BID) | ORAL | Status: DC

## 2014-08-11 MED ORDER — COLCHICINE 0.6 MG PO TABS
0.6000 mg | ORAL_TABLET | Freq: Every day | ORAL | Status: DC
  Administered 2014-08-11 – 2014-08-12 (×2): 0.6 mg via ORAL
  Filled 2014-08-11 (×2): qty 1

## 2014-08-11 NOTE — Progress Notes (Signed)
[facility]                                                                              Patient Demographics  Adrienne [name], is a 80 y.o. female, DOB - [birth year], EUM:[id number]  Admit date - 08/08/2014   Admitting Physician Waldemar Dickens, MD  Outpatient Primary MD for the patient is Thressa Sheller, MD  LOS - 3   Chief Complaint  Patient presents with  . Fall  . Altered Mental Status      Interim history 80 year old female with history of COPD, breast cancer, hypertension and diabetes are presented with altered mental status. Upon admission, it seems her encephalopathy had resolved. Patient was found to have urinary tract infection and started on Levaquin. Urine cultures pending. Patient also has some deconditioning with frequent falls. PT and OT consulted.  Assessment & Plan   Acute encephalopathy likely secondary to urinary tract infection -Resolved, patient appears to be at baseline, alert and oriented -CT of the head showed no acute intracranial process -UA: Many bacteria, 21-50 WBC, moderate leukocytes, positive nitrites -Pending urine culture, blood cultures -Chest x-ray: No acute infiltrate  Physical deconditioning -PT and OT consulted and recommended SNF placement.  Accelerated Hypertension -Continue benazepril, metoprolol, verapamil. Hydralazine added for uncontrolled BP, .  Chronic diastolic heart failure -Last echocardiogram in January 2015 showed an EF of 55-60% with grade 2 diastolic dysfunction -Continue Lasix, benazepril, metoprolol -Monitor daily weights, intake and output I/O last 3 completed shifts: In: 43 [P.O.:480; I.V.:300] Out: 2470 [Urine:2470]       COPD -Appears stable, continue oxygen via nasal cannula -Patient states she wears oxygen at home at night -Continue Pulmicort and Spiriva  Depression -Continue Zoloft  Chronic Kidney Disease, Stage III -Creatinine appears to be stable  Rigth 2nd toe swelling and pain, and  redness: ? Gouty attack.  Pain control with colchicine.    Code Status: Full  Family Communication: None bedside  Disposition Plan: [facility] when bed available.   Time Spent in minutes   15 minutes  Procedures  None  Consults   None  DVT Prophylaxis  Lovenox  Lab Results  Component Value Date   PLT 207 08/09/2014    Medications  Scheduled Meds: . aspirin EC  81 mg Oral QPM  . benazepril  40 mg Oral Daily  . budesonide  0.5 mg Nebulization BID  . colchicine  0.6 mg Oral Daily  . enoxaparin (LOVENOX) injection  40 mg Subcutaneous Q24H  . furosemide  40 mg Oral Daily  . levofloxacin  750 mg Oral Daily  . metoprolol succinate  25 mg Oral Daily  . polyethylene glycol  17 g Oral Daily  . potassium chloride  10 mEq Oral QHS  . senna-docusate  2 tablet Oral BID  . sertraline  50 mg Oral Daily  . tiotropium  18 mcg Inhalation Daily  . verapamil  60 mg Oral BID   Continuous Infusions:   PRN Meds:.albuterol, hydrALAZINE, ondansetron **OR** ondansetron (ZOFRAN) IV, oxyCODONE-acetaminophen  Antibiotics    Anti-infectives    Start     Dose/Rate Route Frequency Ordered Stop   08/10/14 2200  levofloxacin (LEVAQUIN) IVPB 750 mg  Status:  Discontinued  750 mg100 mL/hr over 90 Minutes Intravenous Every 48 hours 08/08/14 2205 08/10/14 1311   08/10/14 1400  levofloxacin (LEVAQUIN) tablet 750 mg     750 mg Oral Daily 08/10/14 1311     08/08/14 2230  levofloxacin (LEVAQUIN) IVPB 750 mg     750 mg100 mL/hr over 90 Minutes Intravenous  Once 08/08/14 2140 08/08/14 2353   08/08/14 1900  cefTRIAXone (ROCEPHIN) 1 g in dextrose 5 % 50 mL IVPB     1 g100 mL/hr over 30 Minutes Intravenous  Once 08/08/14 1852 08/08/14 2133        Subjective:   Adrienne [name] seen and examined today.  Patient states she was feeling much better today.  Patient denies any urinary pain at this time. She denies any frequency or dysuria. She denies chest pain or shortness of breath, abdominal  pain.  Objective:   Filed Vitals:   08/11/14 0539 08/11/14 0651 08/11/14 0830 08/11/14 0831  BP: 193/78 150/58    Pulse: 78 79    Temp: 97.9 F (36.6 C)     TempSrc: Oral     Resp: 20     Height:      Weight:      SpO2: 98%  96% 93%    Wt Readings from Last 3 Encounters:  08/08/14 83.19 kg (183 lb 6.4 oz)  05/08/14 86.773 kg (191 lb 4.8 oz)  01/07/14 83.462 kg (184 lb)     Intake/Output Summary (Last 24 hours) at 08/11/14 1445 Last data filed at 08/10/14 2207  Gross per 24 hour  Intake      0 ml  Output   1100 ml  Net  -1100 ml    Exam  General: Well developed, well nourished, NAD, appears stated age  HEENT: NCAT,  mucous membranes moist.   Neck: Supple, no JVD, no masses  Cardiovascular: S1 S2 auscultated, no rubs, murmurs or gallops. Regular rate and rhythm.  Respiratory: Clear to auscultation bilaterally with equal chest rise  Abdomen: Soft, nontender, nondistended, + bowel sounds  Extremities: warm dry without cyanosis clubbing or edema    Data Review   Micro Results No results found for this or any previous visit (from the past 240 hour(s)).  Radiology Reports Dg Chest 2 View  08/08/2014   CLINICAL DATA:  Fall yesterday with pain and fever  EXAM: CHEST  2 VIEW  COMPARISON:  05/19/2014  FINDINGS: Cardiac shadow remains mildly enlarged. Interstitial markings are again seen and stable. No focal confluent infiltrate is noted. No sizable effusion is seen.  IMPRESSION: Chronic interstitial changes without acute infiltrate.   Electronically Signed   By: Inez Catalina M.D.   On: 08/08/2014 17:37   Dg Lumbar Spine Complete  08/08/2014   CLINICAL DATA:  Fall yesterday morning. Low midline back pain. Initial encounter.  EXAM: LUMBAR SPINE - COMPLETE 4+ VIEW  COMPARISON:  05/07/2014.  FINDINGS: Five lumbar type vertebral bodies. X stop device present at L4-L5. Grade I anterolisthesis of L4 on L5 measuring 8 mm. Grade I retrolisthesis of L3 on L4 is unchanged  compared to prior. Lower lumbar facet arthrosis. Lumbar vertebral body height is preserved. Concavity is superior endplates of L1 and L2 is probably due to oblique projection on the lateral view. Aortoiliac atherosclerosis.  Compared to the prior exam 05/07/2014, there is no interval change.  Severe bilateral sacroiliac joint degenerative disease. Cholecystectomy clips are present in the right upper quadrant.  IMPRESSION: 1. No acute abnormality or interval change. 2. L4-L5 X  stop device with grade I anterolisthesis.   Electronically Signed   By: Dereck Ligas M.D.   On: 08/08/2014 17:38   Ct Head Wo Contrast  08/08/2014   CLINICAL DATA:  Altered mental status, fall.  EXAM: CT HEAD WITHOUT CONTRAST  TECHNIQUE: Contiguous axial images were obtained from the base of the skull through the vertex without intravenous contrast.  COMPARISON:  CT scan of May 19, 2014.  FINDINGS: Bony calvarium appears intact. 2 cm calcified meningioma is noted in the left posterior fossa which is unchanged compared to prior exam. Mild diffuse cortical atrophy is noted. Mild chronic ischemic white matter disease is noted. No mass effect or midline shift is noted. Ventricular size is within normal limits. There is no evidence of hemorrhage or acute infarction.  IMPRESSION: Stable calcified left cerebellar meningioma compared to prior exam. Mild diffuse cortical atrophy. Mild chronic ischemic white matter disease. No acute intracranial abnormality seen.   Electronically Signed   By: Sabino Dick M.D.   On: 08/08/2014 17:07    CBC  Recent Labs Lab 08/08/14 1537 08/09/14 0500  WBC 13.7* 10.9*  HGB 13.3 12.5  HCT 43.0 39.3  PLT 181 207  MCV 95.6 94.9  MCH 29.6 30.2  MCHC 30.9 31.8  RDW 15.5 15.7*    Chemistries   Recent Labs Lab 08/08/14 1537 08/09/14 0500 08/10/14 1025  NA 139 139 137  K 4.1 4.0 4.0  CL 100 99 100  CO2 28 29 27   GLUCOSE 141* 96 129*  BUN 21 23 18   CREATININE 1.07 1.14* 0.89  CALCIUM  9.7 9.1 9.5  AST 21 19  --   ALT 21 19  --   ALKPHOS 120* 115  --   BILITOT 0.3 0.2*  --    ------------------------------------------------------------------------------------------------------------------ estimated creatinine clearance is 45.6 mL/min (by C-G formula based on Cr of 0.89). ------------------------------------------------------------------------------------------------------------------ No results for input(s): HGBA1C in the last 72 hours. ------------------------------------------------------------------------------------------------------------------ No results for input(s): CHOL, HDL, LDLCALC, TRIG, CHOLHDL, LDLDIRECT in the last 72 hours. ------------------------------------------------------------------------------------------------------------------ No results for input(s): TSH, T4TOTAL, T3FREE, THYROIDAB in the last 72 hours.  Invalid input(s): FREET3 ------------------------------------------------------------------------------------------------------------------ No results for input(s): VITAMINB12, FOLATE, FERRITIN, TIBC, IRON, RETICCTPCT in the last 72 hours.  Coagulation profile No results for input(s): INR, PROTIME in the last 168 hours.  No results for input(s): DDIMER in the last 72 hours.  Cardiac Enzymes No results for input(s): CKMB, TROPONINI, MYOGLOBIN in the last 168 hours.  Invalid input(s): CK ------------------------------------------------------------------------------------------------------------------ Invalid input(s): POCBNP    Beda Dula   08/11/2014 at 2:45 PM  Between 7am to 7pm - Pager - [phone]  After 7pm go to [email or web address] - password [email or web address]  And look for the night coverage person covering for me after hours  [facility] Office  [phone]

## 2014-08-11 NOTE — Plan of Care (Signed)
Problem: Phase I Progression Outcomes Goal: Hemodynamically stable Outcome: Completed/Met Date Met:  08/11/14  Problem: Phase II Progression Outcomes Goal: Vital signs remain stable, temperature < 100 Outcome: Completed/Met Date Met:  08/11/14     

## 2014-08-11 NOTE — Plan of Care (Signed)
Problem: Phase I Progression Outcomes Goal: Pain controlled with appropriate interventions Outcome: Completed/Met Date Met:  08/11/14  Problem: Phase II Progression Outcomes Goal: Progress activity as tolerated unless otherwise ordered Outcome: Completed/Met Date Met:  08/11/14 Goal: Tolerating diet Outcome: Completed/Met Date Met:  08/11/14 Goal: Voiding independently Outcome: Completed/Met Date Met:  08/11/14

## 2014-08-12 LAB — URINE CULTURE: Colony Count: >=100000

## 2014-08-12 MED ORDER — COLCHICINE 0.6 MG PO TABS: 0.6000 mg | ORAL_TABLET | Freq: Every day | ORAL | Status: DC

## 2014-08-12 MED ORDER — TRAMADOL HCL 50 MG PO TABS: 50.0000 mg | ORAL_TABLET | Freq: Four times a day (QID) | ORAL | Status: DC | PRN

## 2014-08-12 MED ORDER — LEVOFLOXACIN 750 MG PO TABS: 750.0000 mg | ORAL_TABLET | Freq: Every day | ORAL | Status: DC

## 2014-08-12 NOTE — Clinical Social Work Note (Signed)
[facility] NOTE 08/10/2014  Patient: Adrienne [name], Adrienne [name] Account Number: [id number] Admit date: 08/08/2014  Clinical Social Worker: Renold Genta Date/time: 08/10/2014 11:46 AM  Clinical Social Work is seeking post-discharge placement for this patient at the following level of care: SKILLED NURSING (*CSW will update this form in Epic as items are completed)   08/10/2014 Patient/family provided with [place] Department of Clinical Social Work's list of facilities offering this level of care within the geographic area requested by the patient (or if unable, by the patient's family).  08/10/2014 Patient/family informed of their freedom to choose among providers that offer the needed level of care, that participate in Medicare, Medicaid or managed care program needed by the patient, have an available bed and are willing to accept the patient.  08/10/2014 Patient/family informed of [facility]' ownership interest in [facility], as well as of the fact that they are under no obligation to receive care at this facility.  PASARR submitted to EDS on 08/10/2014 PASARR number received on 08/10/2014  FL2 transmitted to all facilities in geographic area requested by pt/family on 08/10/2014 FL2 transmitted to all facilities within larger geographic area on   Patient informed that his/her managed care company has contracts with or will negotiate with certain facilities, including the following:    Patient/family informed of bed offers received: 08/10/2014 Patient chooses bed at [facility] [place] Physician recommends and patient chooses bed at   Patient to be transferred to [place] on  Patient to be transferred to facility by Ambulance Patient and family notified of transfer on 08/12/14 Name of family member notified: Donia Guiles  The following physician  request were entered in Epic:   Additional Comments:  Per MD patient ready for DC to [place]. RN, patient, patient's family, and facility notified of DC. RN given number for report. DC packet on chart. [facility] transport requested for patient for 2:30pm. CSW signing off.    Liz Beach MSW, [place], [place], [phone]

## 2014-08-12 NOTE — Progress Notes (Signed)
Physical Therapy Treatment Patient Details Name: ALBIE BAZIN MRN: [id number] DOB: [birth year] Today's Date: 08/12/2014    History of Present Illness Pt is 80 yo female admitted with weakness, fall and hallucinations at home.  Pt found to have UTI.  Pt with h/o COPD, HTN, SOB, DM and uses 2L of O2 at night.      PT Comments    Assisted pt OOB to amb to BR to void.  Assisted with amb in hallway twice.  Pt demon 2/4 DOE and required one long sitting rest break. Assisted back to room and positioned in recliner with 2 pillows under buttocks due to increased c/o pain (tail bone/recent fall).  Also pt requested pain meds/RN notified.    Follow Up Recommendations  Home health PT;SNF;Supervision/Assistance - 24 hour     Equipment Recommendations  None recommended by PT (has a 4 WW at home)    Recommendations for Other Services       Precautions / Restrictions Precautions Precautions: Fall Restrictions Weight Bearing Restrictions: No Other Position/Activity Restrictions: Pt with very painful tailbone due to fall.  Nursing notified. pt requested pain meds.      Mobility  Bed Mobility Overal bed mobility: Needs Assistance Bed Mobility: Supine to Sit     Supine to sit: Min guard     General bed mobility comments: increased time   Transfers Overall transfer level: Needs assistance Equipment used: Rolling walker (2 wheeled) Transfers: Sit to/from Stand Sit to Stand: Min guard         General transfer comment: cues for hand placement and min assist off lower level toilet.    Ambulation/Gait Ambulation/Gait assistance: Min guard;Min assist Ambulation Distance (Feet): 50 Feet (25 feet x 2 one long sitting rest break. ) Assistive device: Rolling walker (2 wheeled) Gait Pattern/deviations: Step-through pattern;Trunk flexed Gait velocity: slow   General Gait Details: assist around obstacles and 50% VC's for safety with turn completion.   Stairs             Wheelchair Mobility    Modified Rankin (Stroke Patients Only)       Balance                                    Cognition                            Exercises      General Comments        Pertinent Vitals/Pain Pain Assessment: Faces Faces Pain Scale: Hurts whole lot Pain Location: tail bone Pain Descriptors / Indicators: Discomfort;Grimacing;Guarding Pain Intervention(s): Monitored during session;Premedicated before session;Patient requesting pain meds-RN notified    Home Living                      Prior Function            PT Goals (current goals can now be found in the care plan section) Progress towards PT goals: Progressing toward goals    Frequency  Min 3X/week    PT Plan      Co-evaluation             End of Session Equipment Utilized During Treatment: Gait belt Activity Tolerance: Patient limited by fatigue;Patient limited by pain Patient left: in chair;with call bell/phone within reach (2 pillows under butt)     Time: 7989-2119 PT Time  Calculation (min) (ACUTE ONLY): 28 min  Charges:  $Gait Training: 8-22 mins $Therapeutic Activity: 8-22 mins                    G Codes:      Rica Koyanagi  PTA [facility]  Acute  Rehab Pager      [phone]

## 2014-08-12 NOTE — Discharge Summary (Signed)
Physician Discharge Summary  Adrienne [name] GDJ:[id number] DOB: [birth year] DOA: 08/08/2014  PCP: Thressa Sheller, MD  Admit date: 08/08/2014 Discharge date: 08/12/2014  Time spent: 30 minutes  Recommendations for Outpatient Follow-up:  1. Follow up with PCP in 2 weeks for post hospitalization follow up  Discharge Diagnoses:  Principal Problem:   Altered mental status Active Problems:   Hypertension   COPD (chronic obstructive pulmonary disease)   Weakness   Chronic diastolic heart failure   UTI (urinary tract infection)   Depression   Discharge Condition: improved  Diet recommendation: low sodium diet  Filed Weights   08/08/14 2201 08/12/14 0656  Weight: 83.19 kg (183 lb 6.4 oz) 82 kg (180 lb 12.4 oz)    History of present illness:  80 year old female with history of COPD, breast cancer, hypertension and diabetes are presented with altered mental status. Upon admission, it seems her encephalopathy had resolved. Patient was found to have urinary tract infection and started on Levaquin. Urine cultures pending. Patient also has some deconditioning with frequent falls. PT and OT consulted.   Hospital Course:  Acute encephalopathy likely secondary to urinary tract infection -Resolved, patient appears to be at baseline, alert and oriented -CT of the head showed no acute intracranial process -UA: Many bacteria, 21-50 WBC, moderate leukocytes, positive nitrites -Pending , blood cultures negative so far. Urine cultures show E coli sensitive to levaquin.  -Chest x-ray: No acute infiltrate  Physical deconditioning -PT and OT consulted and recommended SNF placement.  Accelerated Hypertension -Continue benazepril, metoprolol, verapamil.  .  Chronic diastolic heart failure -Last echocardiogram in January 2015 showed an EF of 55-60% with grade 2 diastolic dysfunction -Continue, benazepril, metoprolol -Monitor daily weights, intake and output       COPD -Appears  stable, no wheezing heard.  -Patient states she wears oxygen at home at night -Continue Pulmicort and Spiriva  Depression -Continue Zoloft  Chronic Kidney Disease, Stage III -Creatinine appears to be stable  Rigth 2nd toe swelling and pain, and redness: ? Gouty attack.  Pain control with colchicine.   Procedures:  none  Consultations:  none  Discharge Exam: Filed Vitals:   08/12/14 0500  BP: 132/46  Pulse: 76  Temp: 98 F (36.7 C)  Resp: 18    General: alert afebrile comfortable Cardiovascular: s1s2 Respiratory: ctab  Discharge Instructions You were cared for by a hospitalist during your hospital stay. If you have any questions about your discharge medications or the care you received while you were in the hospital after you are discharged, you can call the unit and asked to speak with the hospitalist on call if the hospitalist that took care of you is not available. Once you are discharged, your primary care physician will handle any further medical issues. Please note that NO REFILLS for any discharge medications will be authorized once you are discharged, as it is imperative that you return to your primary care physician (or establish a relationship with a primary care physician if you do not have one) for your aftercare needs so that they can reassess your need for medications and monitor your lab values.  Discharge Instructions    Diet - low sodium heart healthy    Complete by:  As directed      Discharge instructions    Complete by:  As directed   Follow up with PCP in 1 to 2 weeks. Post hospitalization follow up .          Current Discharge Medication List  START taking these medications   Details  levofloxacin (LEVAQUIN) 750 MG tablet Take 1 tablet (750 mg total) by mouth daily. Qty: 4 tablet, Refills: 0      CONTINUE these medications which have NOT CHANGED   Details  acetaminophen (TYLENOL) 500 MG tablet Take 1,000 mg by mouth every 6 (six) hours  as needed for mild pain (back pain).     albuterol (PROVENTIL HFA;VENTOLIN HFA) 108 (90 BASE) MCG/ACT inhaler Inhale 2 puffs into the lungs every 6 (six) hours as needed for wheezing or shortness of breath. Qty: 1 Inhaler, Refills: 0    albuterol (PROVENTIL) (2.5 MG/3ML) 0.083% nebulizer solution Take 2.5 mg by nebulization every 6 (six) hours.    aspirin EC 81 MG tablet Take 81 mg by mouth every evening.    benazepril (LOTENSIN) 40 MG tablet Take 40 mg by mouth daily.    budesonide (PULMICORT) 0.5 MG/2ML nebulizer solution Take 0.5 mg by nebulization 2 (two) times daily.    CALCIUM PO Take 1 tablet by mouth at bedtime.     Cholecalciferol (VITAMIN D PO) Take 1 capsule by mouth every evening.    diphenhydramine-acetaminophen (TYLENOL PM) 25-500 MG TABS Take 1.5 tablets by mouth at bedtime.    furosemide (LASIX) 40 MG tablet Take 40 mg by mouth daily.     Ketotifen Fumarate (ALAWAY OP) Apply 1 drop to eye 2 (two) times daily as needed (itchy eyes). Both eyes For itchy eyes    Magnesium 250 MG TABS Take 1 tablet by mouth at bedtime.     metoprolol succinate (TOPROL-XL) 50 MG 24 hr tablet Take 25 mg by mouth daily. Take with or immediately following a meal.    Multiple Vitamins-Minerals (PRESERVISION AREDS) CAPS Take 1 capsule by mouth 2 (two) times daily.    Omega-3 Fatty Acids (FISH OIL PO) Take 1 capsule by mouth every evening.    potassium chloride (K-DUR,KLOR-CON) 10 MEQ tablet Take 10 mEq by mouth at bedtime.     promethazine (PHENERGAN) 25 MG tablet Take 12.5 mg by mouth every 6 (six) hours as needed for nausea or vomiting (nausea).    sertraline (ZOLOFT) 50 MG tablet Take 50 mg by mouth daily.     tiotropium (SPIRIVA HANDIHALER) 18 MCG inhalation capsule Place 1 capsule (18 mcg total) into inhaler and inhale daily. Qty: 30 capsule, Refills: 0    verapamil (CALAN) 120 MG tablet Take 60 mg by mouth 2 (two) times daily.       STOP taking these medications     oxyCODONE  (OXY IR/ROXICODONE) 5 MG immediate release tablet        Allergies  Allergen Reactions  . Tetanus Toxoids Swelling  . Latex Rash  . Penicillins Rash      The results of significant diagnostics from this hospitalization (including imaging, microbiology, ancillary and laboratory) are listed below for reference.    Significant Diagnostic Studies: Dg Chest 2 View  08/08/2014   CLINICAL DATA:  Fall yesterday with pain and fever  EXAM: CHEST  2 VIEW  COMPARISON:  05/19/2014  FINDINGS: Cardiac shadow remains mildly enlarged. Interstitial markings are again seen and stable. No focal confluent infiltrate is noted. No sizable effusion is seen.  IMPRESSION: Chronic interstitial changes without acute infiltrate.   Electronically Signed   By: Inez Catalina M.D.   On: 08/08/2014 17:37   Dg Lumbar Spine Complete  08/08/2014   CLINICAL DATA:  Fall yesterday morning. Low midline back pain. Initial encounter.  EXAM: LUMBAR SPINE -  COMPLETE 4+ VIEW  COMPARISON:  05/07/2014.  FINDINGS: Five lumbar type vertebral bodies. X stop device present at L4-L5. Grade I anterolisthesis of L4 on L5 measuring 8 mm. Grade I retrolisthesis of L3 on L4 is unchanged compared to prior. Lower lumbar facet arthrosis. Lumbar vertebral body height is preserved. Concavity is superior endplates of L1 and L2 is probably due to oblique projection on the lateral view. Aortoiliac atherosclerosis.  Compared to the prior exam 05/07/2014, there is no interval change.  Severe bilateral sacroiliac joint degenerative disease. Cholecystectomy clips are present in the right upper quadrant.  IMPRESSION: 1. No acute abnormality or interval change. 2. L4-L5 X stop device with grade I anterolisthesis.   Electronically Signed   By: Dereck Ligas M.D.   On: 08/08/2014 17:38   Ct Head Wo Contrast  08/08/2014   CLINICAL DATA:  Altered mental status, fall.  EXAM: CT HEAD WITHOUT CONTRAST  TECHNIQUE: Contiguous axial images were obtained from the base of  the skull through the vertex without intravenous contrast.  COMPARISON:  CT scan of May 19, 2014.  FINDINGS: Bony calvarium appears intact. 2 cm calcified meningioma is noted in the left posterior fossa which is unchanged compared to prior exam. Mild diffuse cortical atrophy is noted. Mild chronic ischemic white matter disease is noted. No mass effect or midline shift is noted. Ventricular size is within normal limits. There is no evidence of hemorrhage or acute infarction.  IMPRESSION: Stable calcified left cerebellar meningioma compared to prior exam. Mild diffuse cortical atrophy. Mild chronic ischemic white matter disease. No acute intracranial abnormality seen.   Electronically Signed   By: Sabino Dick M.D.   On: 08/08/2014 17:07    Microbiology: Recent Results (from the past 240 hour(s))  Urine culture     Status: None   Collection Time: 08/08/14  4:22 PM  Result Value Ref Range Status   Specimen Description URINE, CLEAN CATCH  Final   Special Requests NONE  Final   Culture  Setup Time   Final    08/09/2014 03:44 Performed at [facility]   Final    >=100,000 COLONIES/ML Performed at [facility]    Culture   Final    ESCHERICHIA COLI Performed at [facility]    Report Status 08/12/2014 FINAL  Final   Organism ID, Bacteria ESCHERICHIA COLI  Final      Susceptibility   Escherichia coli - MIC*    AMPICILLIN 8 SENSITIVE Sensitive     CEFAZOLIN <=4 SENSITIVE Sensitive     CEFTRIAXONE <=1 SENSITIVE Sensitive     CIPROFLOXACIN <=0.25 SENSITIVE Sensitive     GENTAMICIN <=1 SENSITIVE Sensitive     LEVOFLOXACIN <=0.12 SENSITIVE Sensitive     NITROFURANTOIN 32 SENSITIVE Sensitive     TOBRAMYCIN <=1 SENSITIVE Sensitive     TRIMETH/SULFA <=20 SENSITIVE Sensitive     PIP/TAZO <=4 SENSITIVE Sensitive     * ESCHERICHIA COLI     Labs: Basic Metabolic Panel:  Recent Labs Lab 08/08/14 1537 08/09/14 0500 08/10/14 1025  NA 139 139 137   K 4.1 4.0 4.0  CL 100 99 100  CO2 28 29 27   GLUCOSE 141* 96 129*  BUN 21 23 18   CREATININE 1.07 1.14* 0.89  CALCIUM 9.7 9.1 9.5   Liver Function Tests:  Recent Labs Lab 08/08/14 1537 08/09/14 0500  AST 21 19  ALT 21 19  ALKPHOS 120* 115  BILITOT 0.3 0.2*  PROT 6.7  6.5  ALBUMIN 3.0* 2.8*   No results for input(s): LIPASE, AMYLASE in the last 168 hours. No results for input(s): AMMONIA in the last 168 hours. CBC:  Recent Labs Lab 08/08/14 1537 08/09/14 0500  WBC 13.7* 10.9*  HGB 13.3 12.5  HCT 43.0 39.3  MCV 95.6 94.9  PLT 181 207   Cardiac Enzymes: No results for input(s): CKTOTAL, CKMB, CKMBINDEX, TROPONINI in the last 168 hours. BNP: BNP (last 3 results)  Recent Labs  10/01/13 1116 12/03/13 1416  PROBNP 1521.0* 626.0*   CBG:  Recent Labs Lab 08/08/14 1556  GLUCAP 120*       Signed:  Josyah Achor  [facility] 08/12/2014, 12:18 PM

## 2014-08-12 NOTE — Progress Notes (Signed)
Report given to Margarito Courser RN at [facility], all questions answered.  IV taken out, VSS. Education complete.   Packet given to transport.  Pt taken out by [facility].

## 2014-08-15 LAB — CULTURE, BLOOD (ROUTINE X 2)
CULTURE: NO GROWTH
Culture: NO GROWTH

## 2014-08-27 NOTE — ED Provider Notes (Signed)
CSN: [id number]     Arrival date & time 08/08/14  1459 History   First MD Initiated Contact with Patient 08/08/14 1530     Chief Complaint  Patient presents with  . Fall  . Altered Mental Status     (Consider location/radiation/quality/duration/timing/severity/associated sxs/prior Treatment) HPI Patient presents to the emergency department with altered mental status and weakness over the last 3 days.  The patients daughter is giving the history.  The patient is not able to give me very much history accurately.  The daughter states that she has seen her normal self.  She said some nausea but no vomiting, no known fevers and she has also had shortness of breath.  The patient denies pain at any time.  The patient is not able to give me much history.  The daughter states that nothing seems to make her condition better or worse.  She has had a history of UTI.  The daughter states she has had multiple falls as well over the last few weeks Past Medical History  Diagnosis Date  . COPD (chronic obstructive pulmonary disease)   . Arthritis   . Cancer   . Breast cancer     right  . Hypertension   . AKI (acute kidney injury) 10/01/2013  . Shortness of breath   . Diabetes mellitus without complication     TYPE 2   Past Surgical History  Procedure Laterality Date  . Laparoscopic hysterectomy    . Appendectomy    . Laparoscopic cholecystectomy    . Breast surgery Right lumpectomy   Family History  Problem Relation Age of Onset  . Lung cancer Father   . Heart disease Brother    History  Substance Use Topics  . Smoking status: Former Smoker -- 2.00 packs/day for 30 years    Types: Cigarettes    Quit date: 02/20/1980  . Smokeless tobacco: Never Used  . Alcohol Use: No   OB History    No data available     Review of Systems level V caveat applies due to altered mental status  Allergies  Tetanus toxoids; Latex; and Penicillins  Home Medications   Prior to Admission medications    Medication Sig Start Date End Date Taking? Authorizing Provider  acetaminophen (TYLENOL) 500 MG tablet Take 1,000 mg by mouth every 6 (six) hours as needed for mild pain (back pain).    Yes Historical Provider, MD  albuterol (PROVENTIL HFA;VENTOLIN HFA) 108 (90 BASE) MCG/ACT inhaler Inhale 2 puffs into the lungs every 6 (six) hours as needed for wheezing or shortness of breath. 10/04/13  Yes Modena Jansky, MD  albuterol (PROVENTIL) (2.5 MG/3ML) 0.083% nebulizer solution Take 2.5 mg by nebulization every 6 (six) hours.   Yes Historical Provider, MD  aspirin EC 81 MG tablet Take 81 mg by mouth every evening.   Yes Historical Provider, MD  benazepril (LOTENSIN) 40 MG tablet Take 40 mg by mouth daily.   Yes Historical Provider, MD  budesonide (PULMICORT) 0.5 MG/2ML nebulizer solution Take 0.5 mg by nebulization 2 (two) times daily.   Yes Historical Provider, MD  CALCIUM PO Take 1 tablet by mouth at bedtime.    Yes Historical Provider, MD  Cholecalciferol (VITAMIN D PO) Take 1 capsule by mouth every evening.   Yes Historical Provider, MD  diphenhydramine-acetaminophen (TYLENOL PM) 25-500 MG TABS Take 1.5 tablets by mouth at bedtime.   Yes Historical Provider, MD  furosemide (LASIX) 40 MG tablet Take 40 mg by mouth daily.  Yes Historical Provider, MD  Ketotifen Fumarate (ALAWAY OP) Apply 1 drop to eye 2 (two) times daily as needed (itchy eyes). Both eyes For itchy eyes   Yes Historical Provider, MD  Magnesium 250 MG TABS Take 1 tablet by mouth at bedtime.    Yes Historical Provider, MD  metoprolol succinate (TOPROL-XL) 50 MG 24 hr tablet Take 25 mg by mouth daily. Take with or immediately following a meal.   Yes Historical Provider, MD  Multiple Vitamins-Minerals (PRESERVISION AREDS) CAPS Take 1 capsule by mouth 2 (two) times daily.   Yes Historical Provider, MD  Omega-3 Fatty Acids (FISH OIL PO) Take 1 capsule by mouth every evening.   Yes Historical Provider, MD  potassium chloride (K-DUR,KLOR-CON)  10 MEQ tablet Take 10 mEq by mouth at bedtime.    Yes Historical Provider, MD  promethazine (PHENERGAN) 25 MG tablet Take 12.5 mg by mouth every 6 (six) hours as needed for nausea or vomiting (nausea).   Yes Historical Provider, MD  sertraline (ZOLOFT) 50 MG tablet Take 50 mg by mouth daily.  10/07/13  Yes Historical Provider, MD  tiotropium (SPIRIVA HANDIHALER) 18 MCG inhalation capsule Place 1 capsule (18 mcg total) into inhaler and inhale daily. 10/04/13  Yes Modena Jansky, MD  verapamil (CALAN) 120 MG tablet Take 60 mg by mouth 2 (two) times daily.    Yes Historical Provider, MD  colchicine 0.6 MG tablet Take 1 tablet (0.6 mg total) by mouth daily. 08/12/14   Hosie Poisson, MD  levofloxacin (LEVAQUIN) 750 MG tablet Take 1 tablet (750 mg total) by mouth daily. 08/12/14   Hosie Poisson, MD  traMADol (ULTRAM) 50 MG tablet Take 1 tablet (50 mg total) by mouth every 6 (six) hours as needed. 08/12/14   Hosie Poisson, MD   BP 112/42 mmHg  Pulse 80  Temp(Src) 97.6 F (36.4 C) (Oral)  Resp 20  Ht 5\' 4"  (1.626 m)  Wt 180 lb 12.4 oz (82 kg)  BMI 31.02 kg/m2  SpO2 97% Physical Exam  Constitutional: She appears well-developed and well-nourished. No distress.  HENT:  Head: Normocephalic and atraumatic.  Mouth/Throat: Oropharynx is clear and moist.  Eyes: Pupils are equal, round, and reactive to light.  Neck: Normal range of motion. Neck supple.  Cardiovascular: Normal rate, regular rhythm and normal heart sounds.  Exam reveals no gallop and no friction rub.   No murmur heard. Pulmonary/Chest: Effort normal and breath sounds normal. No respiratory distress.  Abdominal: Soft. Bowel sounds are normal. She exhibits no distension. There is no tenderness.  Musculoskeletal: She exhibits no edema.  Neurological: She is alert. She exhibits normal muscle tone. Coordination normal.  Skin: Skin is warm and dry. No rash noted. No erythema.  Psychiatric: She has a normal mood and affect. Her behavior is  normal.  Nursing note and vitals reviewed.   ED Course  Procedures (including critical care time) Labs Review Labs Reviewed  URINALYSIS, ROUTINE W REFLEX MICROSCOPIC - Abnormal; Notable for the following:    APPearance CLOUDY (*)    Nitrite POSITIVE (*)    Leukocytes, UA MODERATE (*)    All other components within normal limits  CBC - Abnormal; Notable for the following:    WBC 13.7 (*)    All other components within normal limits  COMPREHENSIVE METABOLIC PANEL - Abnormal; Notable for the following:    Glucose, Bld 141 (*)    Albumin 3.0 (*)    Alkaline Phosphatase 120 (*)    GFR calc non Af  Amer 45 (*)    GFR calc Af Amer 52 (*)    All other components within normal limits  URINE MICROSCOPIC-ADD ON - Abnormal; Notable for the following:    Squamous Epithelial / LPF FEW (*)    Bacteria, UA MANY (*)    All other components within normal limits  COMPREHENSIVE METABOLIC PANEL - Abnormal; Notable for the following:    Creatinine, Ser 1.14 (*)    Albumin 2.8 (*)    Total Bilirubin 0.2 (*)    GFR calc non Af Amer 42 (*)    GFR calc Af Amer 48 (*)    All other components within normal limits  CBC - Abnormal; Notable for the following:    WBC 10.9 (*)    RDW 15.7 (*)    All other components within normal limits  BASIC METABOLIC PANEL - Abnormal; Notable for the following:    Glucose, Bld 129 (*)    GFR calc non Af Amer 56 (*)    GFR calc Af Amer 65 (*)    All other components within normal limits  CBG MONITORING, ED - Abnormal; Notable for the following:    Glucose-Capillary 120 (*)    All other components within normal limits  URINE CULTURE  CULTURE, BLOOD (ROUTINE X 2)  CULTURE, BLOOD (ROUTINE X 2)    Imaging Review No results found.   EKG Interpretation   Date/Time:  Monday August 08 2014 16:42:14 EST Ventricular Rate:  82 PR Interval:  195 QRS Duration: 88 QT Interval:  386 QTC Calculation: 451 R Axis:   21 Text Interpretation:  Sinus rhythm No  significant change since last  tracing Confirmed by ALLEN  MD, ANTHONY ([id number]) on 08/08/2014 6:59:09 PM      MDM   Final diagnoses:  Fall  Acute cystitis without hematuria  Weakness  Altered mental status, unspecified altered mental status type     Patient needed admission to the hospital for further evaluation and care.  Spoke with the [facility] will admit the patient for further evaluation  Brent General, PA-C 08/27/14 1520

## 2014-12-20 ENCOUNTER — Emergency Department (HOSPITAL_COMMUNITY): Payer: Medicare Other

## 2014-12-20 ENCOUNTER — Emergency Department (HOSPITAL_COMMUNITY)
Admission: EM | Admit: 2014-12-20 | Discharge: 2014-12-20 | Disposition: A | Discharge status: Home / Self Care | Payer: Medicare Other | Attending: Emergency Medicine | Admitting: Emergency Medicine

## 2014-12-20 ENCOUNTER — Encounter (HOSPITAL_COMMUNITY): Payer: Self-pay | Admitting: Family Medicine

## 2014-12-20 DIAGNOSIS — Y9289 Other specified places as the place of occurrence of the external cause: Secondary | ICD-10-CM | POA: Diagnosis not present

## 2014-12-20 DIAGNOSIS — M1990 Unspecified osteoarthritis, unspecified site: Secondary | ICD-10-CM | POA: Insufficient documentation

## 2014-12-20 DIAGNOSIS — Y9389 Activity, other specified: Secondary | ICD-10-CM | POA: Diagnosis not present

## 2014-12-20 DIAGNOSIS — W06XXXA Fall from bed, initial encounter: Secondary | ICD-10-CM | POA: Insufficient documentation

## 2014-12-20 DIAGNOSIS — J449 Chronic obstructive pulmonary disease, unspecified: Secondary | ICD-10-CM | POA: Insufficient documentation

## 2014-12-20 DIAGNOSIS — S0990XA Unspecified injury of head, initial encounter: Secondary | ICD-10-CM | POA: Insufficient documentation

## 2014-12-20 DIAGNOSIS — S80811A Abrasion, right lower leg, initial encounter: Secondary | ICD-10-CM | POA: Diagnosis not present

## 2014-12-20 DIAGNOSIS — W19XXXA Unspecified fall, initial encounter: Secondary | ICD-10-CM

## 2014-12-20 DIAGNOSIS — Z87448 Personal history of other diseases of urinary system: Secondary | ICD-10-CM | POA: Diagnosis not present

## 2014-12-20 DIAGNOSIS — S3992XA Unspecified injury of lower back, initial encounter: Secondary | ICD-10-CM | POA: Diagnosis not present

## 2014-12-20 DIAGNOSIS — Z87440 Personal history of urinary (tract) infections: Secondary | ICD-10-CM | POA: Insufficient documentation

## 2014-12-20 DIAGNOSIS — Z79899 Other long term (current) drug therapy: Secondary | ICD-10-CM | POA: Insufficient documentation

## 2014-12-20 DIAGNOSIS — Z853 Personal history of malignant neoplasm of breast: Secondary | ICD-10-CM | POA: Diagnosis not present

## 2014-12-20 DIAGNOSIS — Z87891 Personal history of nicotine dependence: Secondary | ICD-10-CM | POA: Diagnosis not present

## 2014-12-20 DIAGNOSIS — Z880 Allergy status to penicillin: Secondary | ICD-10-CM | POA: Insufficient documentation

## 2014-12-20 DIAGNOSIS — Z7982 Long term (current) use of aspirin: Secondary | ICD-10-CM | POA: Diagnosis not present

## 2014-12-20 DIAGNOSIS — Z91040 Latex allergy status: Secondary | ICD-10-CM | POA: Diagnosis not present

## 2014-12-20 DIAGNOSIS — E119 Type 2 diabetes mellitus without complications: Secondary | ICD-10-CM | POA: Insufficient documentation

## 2014-12-20 DIAGNOSIS — I10 Essential (primary) hypertension: Secondary | ICD-10-CM | POA: Insufficient documentation

## 2014-12-20 DIAGNOSIS — Y92009 Unspecified place in unspecified non-institutional (private) residence as the place of occurrence of the external cause: Secondary | ICD-10-CM

## 2014-12-20 DIAGNOSIS — Y998 Other external cause status: Secondary | ICD-10-CM | POA: Diagnosis not present

## 2014-12-20 LAB — URINALYSIS, ROUTINE W REFLEX MICROSCOPIC
Bilirubin Urine: NEGATIVE
GLUCOSE, UA: NEGATIVE mg/dL
HGB URINE DIPSTICK: NEGATIVE
Ketones, ur: NEGATIVE mg/dL
Nitrite: NEGATIVE
Protein, ur: NEGATIVE mg/dL
SPECIFIC GRAVITY, URINE: 1.018 (ref 1.005–1.030)
Urobilinogen, UA: 0.2 mg/dL (ref 0.0–1.0)
pH: 5 (ref 5.0–8.0)

## 2014-12-20 LAB — COMPREHENSIVE METABOLIC PANEL
ALBUMIN: 3.6 g/dL (ref 3.5–5.2)
ALT: 17 U/L (ref 0–35)
AST: 20 U/L (ref 0–37)
Alkaline Phosphatase: 97 U/L (ref 39–117)
Anion gap: 11 (ref 5–15)
BILIRUBIN TOTAL: 0.5 mg/dL (ref 0.3–1.2)
BUN: 26 mg/dL — AB (ref 6–23)
CO2: 28 mmol/L (ref 19–32)
Calcium: 10.3 mg/dL (ref 8.4–10.5)
Chloride: 98 mmol/L (ref 96–112)
Creatinine, Ser: 1.56 mg/dL — ABNORMAL HIGH (ref 0.50–1.10)
GFR calc non Af Amer: 29 mL/min — ABNORMAL LOW (ref >90)
GFR, EST AFRICAN AMERICAN: 33 mL/min — AB (ref >90)
GLUCOSE: 113 mg/dL — AB (ref 70–99)
Potassium: 3.7 mmol/L (ref 3.5–5.1)
Sodium: 137 mmol/L (ref 135–145)
TOTAL PROTEIN: 6.7 g/dL (ref 6.0–8.3)

## 2014-12-20 LAB — CBC WITH DIFFERENTIAL/PLATELET
Basophils Absolute: 0 10*3/uL (ref 0.0–0.1)
Basophils Relative: 0 % (ref 0–1)
Eosinophils Absolute: 0.4 10*3/uL (ref 0.0–0.7)
Eosinophils Relative: 3 % (ref 0–5)
HEMATOCRIT: 40.8 % (ref 36.0–46.0)
Hemoglobin: 13 g/dL (ref 12.0–15.0)
LYMPHS ABS: 2.6 10*3/uL (ref 0.7–4.0)
LYMPHS PCT: 19 % (ref 12–46)
MCH: 30.3 pg (ref 26.0–34.0)
MCHC: 31.9 g/dL (ref 30.0–36.0)
MCV: 95.1 fL (ref 78.0–100.0)
Monocytes Absolute: 1.1 10*3/uL — ABNORMAL HIGH (ref 0.1–1.0)
Monocytes Relative: 8 % (ref 3–12)
NEUTROS ABS: 9.6 10*3/uL — AB (ref 1.7–7.7)
Neutrophils Relative %: 70 % (ref 43–77)
PLATELETS: 191 10*3/uL (ref 150–400)
RBC: 4.29 MIL/uL (ref 3.87–5.11)
RDW: 15.1 % (ref 11.5–15.5)
WBC: 13.7 10*3/uL — AB (ref 4.0–10.5)

## 2014-12-20 LAB — URINE MICROSCOPIC-ADD ON

## 2014-12-20 MED ORDER — SODIUM CHLORIDE 0.9 % IV BOLUS (SEPSIS)
1000.0000 mL | Freq: Once | INTRAVENOUS | Status: AC
  Administered 2014-12-20: 1000 mL via INTRAVENOUS

## 2014-12-20 NOTE — ED Notes (Signed)
Per EMS, patient lives with daughter and son-in-law, and fell while getting out of bed. Pt reports she lost her footing. Denies any new injury. Patient complains of back pain that is not new from her chronic back pain. Family stated to EMS that she had has increased confusion and could possibly be a UTI like during Thanksgiving.

## 2014-12-20 NOTE — Discharge Instructions (Signed)
Her CT scan of her head tonight does not show any acute injury or changes and her lumbar spine and coccyx xrays do not show anything new. Her urine did not look like an infection is present, but a urine culture was done which should be resulted in about 2 days. She was given 1 liter of IV fluids because of her decreased oral intake.  She should return to the ED for any problems listed on the head injury sheet.

## 2014-12-20 NOTE — ED Notes (Signed)
Spoke with patients son-in-law, he reports he will come get patient. He states it will be at least 30 minutes.

## 2014-12-20 NOTE — ED Notes (Signed)
Bed: AN19 Expected date:  Expected time:  Means of arrival:  Comments: EMS 82 yo female found by family after a fall-sliding to knees after losing footing-found on left side-increased confusion and loss of appetite for the past 2 days-has these symptoms with UTI

## 2014-12-20 NOTE — ED Provider Notes (Signed)
CSN: [id number]     Arrival date & time 12/20/14  0004 History   First MD Initiated Contact with Patient 12/20/14 0014     Chief Complaint  Patient presents with  . Fall   Level V caveat for confusion  (Consider location/radiation/quality/duration/timing/severity/associated sxs/prior Treatment) HPI  Patient states that she fell getting out of bed today. She states she lost her footing. She states she hit the back of her head which hurts. She also states when she landed she fell in a sitting type position and she has pain in her lower back. Family told EMS patient has been having some increased confusion which is similar to when she had a UTI at Thanksgiving time.  PCP Dr Ilean China  Past Medical History  Diagnosis Date  . COPD (chronic obstructive pulmonary disease)   . Arthritis   . Cancer   . Breast cancer     right  . Hypertension   . AKI (acute kidney injury) 10/01/2013  . Shortness of breath   . Diabetes mellitus without complication     TYPE 2   Past Surgical History  Procedure Laterality Date  . Laparoscopic hysterectomy    . Appendectomy    . Laparoscopic cholecystectomy    . Breast surgery Right lumpectomy   Family History  Problem Relation Age of Onset  . Lung cancer Father   . Heart disease Brother    History  Substance Use Topics  . Smoking status: Former Smoker -- 2.00 packs/day for 30 years    Types: Cigarettes    Quit date: 02/20/1980  . Smokeless tobacco: Never Used  . Alcohol Use: No   Uses a walker Lives with daughter and SIL  OB History    No data available     Review of Systems  All other systems reviewed and are negative.     Allergies  Tetanus toxoids; Latex; and Penicillins  Home Medications   Prior to Admission medications   Medication Sig Start Date End Date Taking? Authorizing Provider  acetaminophen (TYLENOL) 500 MG tablet Take 1,000 mg by mouth every 6 (six) hours as needed for mild pain (back pain).     Historical  Provider, MD  albuterol (PROVENTIL HFA;VENTOLIN HFA) 108 (90 BASE) MCG/ACT inhaler Inhale 2 puffs into the lungs every 6 (six) hours as needed for wheezing or shortness of breath. 10/04/13   Modena Jansky, MD  albuterol (PROVENTIL) (2.5 MG/3ML) 0.083% nebulizer solution Take 2.5 mg by nebulization every 6 (six) hours.    Historical Provider, MD  aspirin EC 81 MG tablet Take 81 mg by mouth every evening.    Historical Provider, MD  benazepril (LOTENSIN) 40 MG tablet Take 40 mg by mouth daily.    Historical Provider, MD  budesonide (PULMICORT) 0.5 MG/2ML nebulizer solution Take 0.5 mg by nebulization 2 (two) times daily.    Historical Provider, MD  CALCIUM PO Take 1 tablet by mouth at bedtime.     Historical Provider, MD  Cholecalciferol (VITAMIN D PO) Take 1 capsule by mouth every evening.    Historical Provider, MD  colchicine 0.6 MG tablet Take 1 tablet (0.6 mg total) by mouth daily. 08/12/14   Hosie Poisson, MD  diphenhydramine-acetaminophen (TYLENOL PM) 25-500 MG TABS Take 1.5 tablets by mouth at bedtime.    Historical Provider, MD  furosemide (LASIX) 40 MG tablet Take 40 mg by mouth daily.     Historical Provider, MD  Ketotifen Fumarate (ALAWAY OP) Apply 1 drop to eye 2 (two)  times daily as needed (itchy eyes). Both eyes For itchy eyes    Historical Provider, MD  levofloxacin (LEVAQUIN) 750 MG tablet Take 1 tablet (750 mg total) by mouth daily. 08/12/14   Hosie Poisson, MD  Magnesium 250 MG TABS Take 1 tablet by mouth at bedtime.     Historical Provider, MD  metoprolol succinate (TOPROL-XL) 50 MG 24 hr tablet Take 25 mg by mouth daily. Take with or immediately following a meal.    Historical Provider, MD  Multiple Vitamins-Minerals (PRESERVISION AREDS) CAPS Take 1 capsule by mouth 2 (two) times daily.    Historical Provider, MD  Omega-3 Fatty Acids (FISH OIL PO) Take 1 capsule by mouth every evening.    Historical Provider, MD  potassium chloride (K-DUR,KLOR-CON) 10 MEQ tablet Take 10 mEq by  mouth at bedtime.     Historical Provider, MD  promethazine (PHENERGAN) 25 MG tablet Take 12.5 mg by mouth every 6 (six) hours as needed for nausea or vomiting (nausea).    Historical Provider, MD  sertraline (ZOLOFT) 50 MG tablet Take 50 mg by mouth daily.  10/07/13   Historical Provider, MD  tiotropium (SPIRIVA HANDIHALER) 18 MCG inhalation capsule Place 1 capsule (18 mcg total) into inhaler and inhale daily. 10/04/13   Modena Jansky, MD  traMADol (ULTRAM) 50 MG tablet Take 1 tablet (50 mg total) by mouth every 6 (six) hours as needed. 08/12/14   Hosie Poisson, MD  verapamil (CALAN) 120 MG tablet Take 60 mg by mouth 2 (two) times daily.     Historical Provider, MD   BP 142/70 mmHg  Pulse 74  Temp(Src) 98.3 F (36.8 C) (Oral)  Resp 18  Ht 5\' 4"  (1.626 m)  Wt 190 lb (86.183 kg)  BMI 32.60 kg/m2  SpO2 93%  Vital signs normal   Physical Exam  Constitutional: She is oriented to person, place, and time. She appears well-developed and well-nourished.  Non-toxic appearance. She does not appear ill. No distress.  HENT:  Head: Normocephalic and atraumatic.  Right Ear: External ear normal.  Left Ear: External ear normal.  Nose: Nose normal. No mucosal edema or rhinorrhea.  Mouth/Throat: Oropharynx is clear and moist and mucous membranes are normal. No dental abscesses or uvula swelling.  Patient has tenderness her posterior scalp with some prominence which is questionable contusion  Eyes: Conjunctivae and EOM are normal. Pupils are equal, round, and reactive to light.  Neck: Normal range of motion and full passive range of motion without pain. Neck supple.  Cardiovascular: Normal rate, regular rhythm and normal heart sounds.  Exam reveals no gallop and no friction rub.   No murmur heard. Pulmonary/Chest: Effort normal and breath sounds normal. No respiratory distress. She has no wheezes. She has no rhonchi. She has no rales. She exhibits no tenderness and no crepitus.  Abdominal: Soft. Normal  appearance and bowel sounds are normal. She exhibits no distension. There is no tenderness. There is no rebound and no guarding.  Musculoskeletal: Normal range of motion. She exhibits no edema or tenderness.  Moves all extremities well. Patient has a very superficial skin tear on her mid anterior right shin that is not bleeding. It is approximately 1 cm in length. Patient complains of back pain over her coccyx and lower lumbar spine.  Neurological: She is alert and oriented to person, place, and time. She has normal strength. No cranial nerve deficit.  Skin: Skin is warm, dry and intact. No rash noted. No erythema. No pallor.  Psychiatric: She  has a normal mood and affect. Her speech is normal and behavior is normal. Her mood appears not anxious.  Nursing note and vitals reviewed.   ED Course  Procedures (including critical care time)  Medications  sodium chloride 0.9 % bolus 1,000 mL (1,000 mLs Intravenous New Bag/Given 12/20/14 0256)   Patient was given 1 L of IV fluids because family told EMS she had not been eating as much recently. She also had a new mild renal insufficiency consistent with mild dehydration. At this point patient did not meet criteria for admission to the hospital. She was discharged back to her home. Urine culture was sent and she was not started on antibiotics as her urinalysis was not consistent with a UTI.   Labs Review Results for orders placed or performed during the hospital encounter of 12/20/14  U/A (may I&O cath if menses)  Result Value Ref Range   Color, Urine YELLOW YELLOW   APPearance CLEAR CLEAR   Specific Gravity, Urine 1.018 1.005 - 1.030   pH 5.0 5.0 - 8.0   Glucose, UA NEGATIVE NEGATIVE mg/dL   Hgb urine dipstick NEGATIVE NEGATIVE   Bilirubin Urine NEGATIVE NEGATIVE   Ketones, ur NEGATIVE NEGATIVE mg/dL   Protein, ur NEGATIVE NEGATIVE mg/dL   Urobilinogen, UA 0.2 0.0 - 1.0 mg/dL   Nitrite NEGATIVE NEGATIVE   Leukocytes, UA SMALL (A) NEGATIVE   Comprehensive metabolic panel  Result Value Ref Range   Sodium 137 135 - 145 mmol/L   Potassium 3.7 3.5 - 5.1 mmol/L   Chloride 98 96 - 112 mmol/L   CO2 28 19 - 32 mmol/L   Glucose, Bld 113 (H) 70 - 99 mg/dL   BUN 26 (H) 6 - 23 mg/dL   Creatinine, Ser 1.56 (H) 0.50 - 1.10 mg/dL   Calcium 10.3 8.4 - 10.5 mg/dL   Total Protein 6.7 6.0 - 8.3 g/dL   Albumin 3.6 3.5 - 5.2 g/dL   AST 20 0 - 37 U/L   ALT 17 0 - 35 U/L   Alkaline Phosphatase 97 39 - 117 U/L   Total Bilirubin 0.5 0.3 - 1.2 mg/dL   GFR calc non Af Amer 29 (L) >90 mL/min   GFR calc Af Amer 33 (L) >90 mL/min   Anion gap 11 5 - 15  CBC with Differential  Result Value Ref Range   WBC 13.7 (H) 4.0 - 10.5 K/uL   RBC 4.29 3.87 - 5.11 MIL/uL   Hemoglobin 13.0 12.0 - 15.0 g/dL   HCT 40.8 36.0 - 46.0 %   MCV 95.1 78.0 - 100.0 fL   MCH 30.3 26.0 - 34.0 pg   MCHC 31.9 30.0 - 36.0 g/dL   RDW 15.1 11.5 - 15.5 %   Platelets 191 150 - 400 K/uL   Neutrophils Relative % 70 43 - 77 %   Neutro Abs 9.6 (H) 1.7 - 7.7 K/uL   Lymphocytes Relative 19 12 - 46 %   Lymphs Abs 2.6 0.7 - 4.0 K/uL   Monocytes Relative 8 3 - 12 %   Monocytes Absolute 1.1 (H) 0.1 - 1.0 K/uL   Eosinophils Relative 3 0 - 5 %   Eosinophils Absolute 0.4 0.0 - 0.7 K/uL   Basophils Relative 0 0 - 1 %   Basophils Absolute 0.0 0.0 - 0.1 K/uL  Urine microscopic-add on  Result Value Ref Range   Squamous Epithelial / LPF RARE RARE   WBC, UA 0-2 <3 WBC/hpf   Bacteria, UA RARE RARE  Casts HYALINE CASTS (A) NEGATIVE   Laboratory interpretation all normal except leukocytosis     Imaging Review Dg Lumbar Spine Complete  12/20/2014   CLINICAL DATA:  Patient fell getting out of bed. Chronic back pain for several months, worse after the fall. Pain radiates to the groin.  EXAM: LUMBAR SPINE - COMPLETE 4+ VIEW  COMPARISON:  08/08/2014  FINDINGS: Mild anterior subluxation of L4 on L5, similar to prior study. Postoperative changes with posterior fixation and bone fusion from  L4-L5. Probable L4-5 laminectomy. Degenerative changes in the lumbar spine with narrowed lumbar interspaces and degenerative hypertrophic changes present. Mild depression of superior endplate of L1 is unchanged since prior study. No acute compression is demonstrated. Vascular calcifications. Surgical clips in the right upper quadrant and pelvis.  IMPRESSION: Mild anterior subluxation of L4 on L5 with posterior fixation, similar to prior study. Degenerative changes. No acute displaced fractures identified.   Electronically Signed   By: Lucienne Capers M.D.   On: 12/20/2014 01:24   Dg Sacrum/coccyx  12/20/2014   CLINICAL DATA:  Patient fell getting out of bed. Chronic back pain for several months, worse after the fall. Pain radiates to the groin.  EXAM: SACRUM AND COCCYX - 2+ VIEW  COMPARISON:  Lumbar spine 08/08/2014  FINDINGS: Evaluation of sacral coccygeal spine is limited due to overlying bowel gas and stool. Visualized sacrum, SI joints, and sacral struts appear intact. Normal alignment of the sacral coccygeal spine. No displaced acute fractures identified. Postoperative changes with posterior fixation and bone fusion of the lower lumbar spine. Vascular calcifications. Surgical clips in the pelvis.  IMPRESSION: No acute bony abnormalities.   Electronically Signed   By: Lucienne Capers M.D.   On: 12/20/2014 01:21   Ct Head Wo Contrast  12/20/2014   CLINICAL DATA:  Initial valuation for acute trauma, fall.  EXAM: CT HEAD WITHOUT CONTRAST  TECHNIQUE: Contiguous axial images were obtained from the base of the skull through the vertex without intravenous contrast.  COMPARISON:  Prior CT from 08/08/2014  FINDINGS: 1.8 x 2.0 cm partially calcified meningioma at the left cerebellar hemisphere is stable. No edema.  No acute intracranial hemorrhage or large vessel territory infarct. No hydrocephalus. No extra-axial fluid collection.  Atrophy with chronic microvascular ischemic changes present. Prominent vascular  calcifications present within the carotid siphons and distal vertebral arteries.  Scalp soft tissues within normal limits. No acute abnormality about the orbits.  Minimal opacity noted within the left sphenoid sinus. Paranasal sinuses are otherwise clear. No mastoid effusion.  Calvarium intact.  IMPRESSION: 1. No acute intracranial process. 2. Stable partially calcified meningioma at the left cerebellum. 3. Atrophy with chronic microvascular ischemic disease.   Electronically Signed   By: Jeannine Boga M.D.   On: 12/20/2014 01:21     EKG Interpretation None      MDM   Final diagnoses:  Fall at home, initial encounter   Plan discharge  Rolland Porter, MD, Barbette Or, MD 12/20/14 [phone]

## 2014-12-21 LAB — URINE CULTURE
COLONY COUNT: NO GROWTH
CULTURE: NO GROWTH
Special Requests: NORMAL

## 2015-03-11 ENCOUNTER — Encounter (HOSPITAL_COMMUNITY): Payer: Self-pay | Admitting: Emergency Medicine

## 2015-03-11 ENCOUNTER — Emergency Department (HOSPITAL_COMMUNITY)
Admission: EM | Admit: 2015-03-11 | Discharge: 2015-03-11 | Disposition: A | Discharge status: Home / Self Care | Payer: Medicare Other | Attending: Emergency Medicine | Admitting: Emergency Medicine

## 2015-03-11 ENCOUNTER — Emergency Department (HOSPITAL_COMMUNITY): Payer: Medicare Other

## 2015-03-11 DIAGNOSIS — Z7951 Long term (current) use of inhaled steroids: Secondary | ICD-10-CM | POA: Diagnosis not present

## 2015-03-11 DIAGNOSIS — F0390 Unspecified dementia without behavioral disturbance: Secondary | ICD-10-CM | POA: Diagnosis not present

## 2015-03-11 DIAGNOSIS — Z79899 Other long term (current) drug therapy: Secondary | ICD-10-CM | POA: Diagnosis not present

## 2015-03-11 DIAGNOSIS — Z792 Long term (current) use of antibiotics: Secondary | ICD-10-CM | POA: Diagnosis not present

## 2015-03-11 DIAGNOSIS — J449 Chronic obstructive pulmonary disease, unspecified: Secondary | ICD-10-CM | POA: Diagnosis not present

## 2015-03-11 DIAGNOSIS — Z853 Personal history of malignant neoplasm of breast: Secondary | ICD-10-CM | POA: Insufficient documentation

## 2015-03-11 DIAGNOSIS — Z87891 Personal history of nicotine dependence: Secondary | ICD-10-CM | POA: Diagnosis not present

## 2015-03-11 DIAGNOSIS — E119 Type 2 diabetes mellitus without complications: Secondary | ICD-10-CM | POA: Insufficient documentation

## 2015-03-11 DIAGNOSIS — Z7982 Long term (current) use of aspirin: Secondary | ICD-10-CM | POA: Insufficient documentation

## 2015-03-11 DIAGNOSIS — R4182 Altered mental status, unspecified: Secondary | ICD-10-CM | POA: Diagnosis present

## 2015-03-11 DIAGNOSIS — Z91040 Latex allergy status: Secondary | ICD-10-CM | POA: Diagnosis not present

## 2015-03-11 DIAGNOSIS — Z87448 Personal history of other diseases of urinary system: Secondary | ICD-10-CM | POA: Insufficient documentation

## 2015-03-11 DIAGNOSIS — M1990 Unspecified osteoarthritis, unspecified site: Secondary | ICD-10-CM | POA: Insufficient documentation

## 2015-03-11 DIAGNOSIS — Z880 Allergy status to penicillin: Secondary | ICD-10-CM | POA: Diagnosis not present

## 2015-03-11 DIAGNOSIS — I10 Essential (primary) hypertension: Secondary | ICD-10-CM | POA: Insufficient documentation

## 2015-03-11 LAB — COMPREHENSIVE METABOLIC PANEL
ALT: 14 U/L (ref 14–54)
AST: 15 U/L (ref 15–41)
Albumin: 3.5 g/dL (ref 3.5–5.0)
Alkaline Phosphatase: 107 U/L (ref 38–126)
Anion gap: 9 (ref 5–15)
BUN: 15 mg/dL (ref 6–20)
CALCIUM: 9.5 mg/dL (ref 8.9–10.3)
CO2: 30 mmol/L (ref 22–32)
CREATININE: 0.94 mg/dL (ref 0.44–1.00)
Chloride: 100 mmol/L — ABNORMAL LOW (ref 101–111)
GFR calc Af Amer: >60 mL/min (ref >60)
GFR calc non Af Amer: 52 mL/min — ABNORMAL LOW (ref >60)
Glucose, Bld: 130 mg/dL — ABNORMAL HIGH (ref 65–99)
Potassium: 3.8 mmol/L (ref 3.5–5.1)
Sodium: 139 mmol/L (ref 135–145)
Total Bilirubin: 0.9 mg/dL (ref 0.3–1.2)
Total Protein: 6.9 g/dL (ref 6.5–8.1)

## 2015-03-11 LAB — URINALYSIS, ROUTINE W REFLEX MICROSCOPIC
BILIRUBIN URINE: NEGATIVE
Glucose, UA: NEGATIVE mg/dL
Hgb urine dipstick: NEGATIVE
KETONES UR: NEGATIVE mg/dL
NITRITE: NEGATIVE
PROTEIN: NEGATIVE mg/dL
Specific Gravity, Urine: 1.014 (ref 1.005–1.030)
Urobilinogen, UA: 0.2 mg/dL (ref 0.0–1.0)
pH: 7 (ref 5.0–8.0)

## 2015-03-11 LAB — CBC
HCT: 43.6 % (ref 36.0–46.0)
HEMOGLOBIN: 13.5 g/dL (ref 12.0–15.0)
MCH: 29.2 pg (ref 26.0–34.0)
MCHC: 31 g/dL (ref 30.0–36.0)
MCV: 94.4 fL (ref 78.0–100.0)
Platelets: 194 10*3/uL (ref 150–400)
RBC: 4.62 MIL/uL (ref 3.87–5.11)
RDW: 15.3 % (ref 11.5–15.5)
WBC: 12 10*3/uL — ABNORMAL HIGH (ref 4.0–10.5)

## 2015-03-11 LAB — URINE MICROSCOPIC-ADD ON

## 2015-03-11 NOTE — ED Notes (Signed)
Bed: WA10 Expected date: 03/11/15 Expected time: 2:53 PM Means of arrival: Ambulance Comments: AMS ? UTI has history

## 2015-03-11 NOTE — ED Notes (Signed)
Awake. Verbally responsive. A/O x4. Resp even and unlabored. No audible adventitious breath sounds noted. ABC's intact. IV saline lock patent and intact. 

## 2015-03-11 NOTE — ED Notes (Signed)
RN Blanch in room starting IV and getting labs currently

## 2015-03-11 NOTE — ED Notes (Signed)
Pt arrived via EMS with report of lethargic, increase confusion since this morning, and strong odor urine. Pt A/O x4. Verbally responsive. Resp even and unlabored. ABC's intact. Follows commands. Neurologically intact. Pt denies urinary frequency/urgency, dysuria, hematuria, lower abd/back pain/pressure or burning with void.

## 2015-03-11 NOTE — ED Notes (Signed)
Resting quietly with eyes closed. Easily arousable. Verbally responsive. A/O x4. Resp even and unlabored. No audible adventitious breath sounds noted. ABC's intact. IV saline lock patent and intact. Family at bedside. Dr. Ashok Cordia at bedside.

## 2015-03-11 NOTE — Discharge Instructions (Signed)
It was our pleasure to provide your ER care today - we hope that you feel better.  Your CT scan was read as showing no acute process, and lab work appears unremarkable.   Follow up with your doctor Monday for recheck.  Also have them recheck your blood pressure as it is high today.   Return to ER if worse, new symptoms, fevers, trouble breathing, vomiting, medical emergency, other concern.      Altered Mental Status Altered mental status most often refers to an abnormal change in your responsiveness and awareness. It can affect your speech, thought, mobility, memory, attention span, or alertness. It can range from slight confusion to complete unresponsiveness (coma). Altered mental status can be a sign of a serious underlying medical condition. Rapid evaluation and medical treatment is necessary for patients having an altered mental status. CAUSES   Low blood sugar (hypoglycemia) or diabetes.  Severe loss of body fluids (dehydration) or a body salt (electrolyte) imbalance.  A stroke or other neurologic problem, such as dementia or delirium.  A head injury or tumor.  A drug or alcohol overdose.  Exposure to toxins or poisons.  Depression, anxiety, and stress.  A low oxygen level (hypoxia).  An infection.  Blood loss.  Twitching or shaking (seizure).  Heart problems, such as heart attack or heart rhythm problems (arrhythmias).  A body temperature that is too low or too high (hypothermia or hyperthermia). DIAGNOSIS  A diagnosis is based on your history, symptoms, physical and neurologic examinations, and diagnostic tests. Diagnostic tests may include:  Measurement of your blood pressure, pulse, breathing, and oxygen levels (vital signs).  Blood tests.  Urine tests.  X-ray exams.  A computerized magnetic scan (magnetic resonance imaging, MRI).  A computerized X-ray scan (computed tomography, CT scan). TREATMENT  Treatment will depend on the cause. Treatment may  include:  Management of an underlying medical or mental health condition.  Critical care or support in the hospital. [place]   Only take over-the-counter or prescription medicines for pain, discomfort, or fever as directed by your caregiver.  Manage underlying conditions as directed by your caregiver.  Eat a healthy, well-balanced diet to maintain strength.  Join a support group or prevention program to cope with the condition or trauma that caused the altered mental status. Ask your caregiver to help choose a program that works for you.  Follow up with your caregiver for further examination, therapy, or testing as directed. SEEK MEDICAL CARE IF:   You feel unwell or have chills.  You or your family notice a change in your behavior or your alertness.  You have trouble following your caregiver's treatment plan.  You have questions or concerns. SEEK IMMEDIATE MEDICAL CARE IF:   You have a rapid heartbeat or have chest pain.  You have difficulty breathing.  You have a fever.  You have a headache with a stiff neck.  You cough up blood.  You have blood in your urine or stool.  You have severe agitation or confusion. MAKE SURE YOU:   Understand these instructions.  Will watch your condition.  Will get help right away if you are not doing well or get worse. Document Released: 02/20/2010 Document Revised: 11/25/2011 Document Reviewed: 02/20/2010 [facility] Patient Information 2015 [place], [place]. This information is not intended to replace advice given to you by your health care provider. Make sure you discuss any questions you have with your health care provider.    Dementia Dementia is a general term  for problems with brain function. A person with dementia has memory loss and a hard time with at least one other brain function such as thinking, speaking, or problem solving. Dementia can affect social functioning, how you do your job, your mood, or your  personality. The changes may be hidden for a long time. The earliest forms of this disease are usually not detected by family or friends. Dementia can be:  Irreversible.  Potentially reversible.  Partially reversible.  Progressive. This means it can get worse over time. CAUSES  Irreversible dementia causes may include:  Degeneration of brain cells (Alzheimer disease or Lewy body dementia).  Multiple small strokes (vascular dementia).  Infection (chronic meningitis or Creutzfeldt-Jakob disease).  Frontotemporal dementia. This affects younger people, age 49 to 20, compared to those who have Alzheimer disease.  Dementia associated with other disorders like Parkinson disease, Huntington disease, or HIV-associated dementia. Potentially or partially reversible dementia causes may include:  Medicines.  Metabolic causes such as excessive alcohol intake, vitamin B12 deficiency, or thyroid disease.  Masses or pressure in the brain such as a tumor, blood clot, or hydrocephalus. SIGNS AND SYMPTOMS  Symptoms are often hard to detect. Family members or coworkers may not notice them early in the disease process. Different people with dementia may have different symptoms. Symptoms can include:  A hard time with memory, especially recent memory. Long-term memory may not be impaired.  Asking the same question multiple times or forgetting something someone just said.  A hard time speaking your thoughts or finding certain words.  A hard time solving problems or performing familiar tasks (such as how to use a telephone).  Sudden changes in mood.  Changes in personality, especially increasing moodiness or mistrust.  Depression.  A hard time understanding complex ideas that were never a problem in the past. DIAGNOSIS  There are no specific tests for dementia.   Your health care provider may recommend a thorough evaluation. This is because some forms of dementia can be reversible. The  evaluation will likely include a physical exam and getting a detailed history from you and a family member. The history often gives the best clues and suggestions for a diagnosis.  Memory testing may be done. A detailed brain function evaluation called neuropsychologic testing may be helpful.  Lab tests and brain imaging (such as a CT scan or MRI scan) are sometimes important.  Sometimes observation and re-evaluation over time is very helpful. TREATMENT  Treatment depends on the cause.   If the problem is a vitamin deficiency, it may be helped or cured with supplements.  For dementias such as Alzheimer disease, medicines are available to stabilize or slow the course of the disease. There are no cures for this type of dementia.  Your health care provider can help direct you to groups, organizations, and other health care providers to help with decisions in the care of you or your loved one. HOME CARE INSTRUCTIONS The care of individuals with dementia is varied and dependent upon the progression of the dementia. The following suggestions are intended for the person living with, or caring for, the person with dementia.  Create a safe environment.  Remove the locks on bathroom doors to prevent the person from accidentally locking himself or herself in.  Use childproof latches on kitchen cabinets and any place where cleaning supplies, chemicals, or alcohol are kept.  Use childproof covers in unused electrical outlets.  Install childproof devices to keep doors and windows secured.  Remove stove knobs  or install safety knobs and an automatic shut-off on the stove.  Lower the temperature on water heaters.  Label medicines and keep them locked up.  Secure knives, lighters, matches, power tools, and guns, and keep these items out of reach.  Keep the house free from clutter. Remove rugs or anything that might contribute to a fall.  Remove objects that might break and hurt the  person.  Make sure lighting is good, both inside and outside.  Install grab rails as needed.  Use a monitoring device to alert you to falls or other needs for help.  Reduce confusion.  Keep familiar objects and people around.  Use night lights or dim lights at night.  Label items or areas.  Use reminders, notes, or directions for daily activities or tasks.  Keep a simple, consistent routine for waking, meals, bathing, dressing, and bedtime.  Create a calm, quiet environment.  Place large clocks and calendars prominently.  Display emergency numbers and home address near all telephones.  Use cues to establish different times of the day. An example is to open curtains to let the natural light in during the day.   Use effective communication.  Choose simple words and short sentences.  Use a gentle, calm tone of voice.  Be careful not to interrupt.  If the person is struggling to find a word or communicate a thought, try to provide the word or thought.  Ask one question at a time. Allow the person ample time to answer questions. Repeat the question again if the person does not respond.  Reduce nighttime restlessness.  Provide a comfortable bed.  Have a consistent nighttime routine.  Ensure a regular walking or physical activity schedule. Involve the person in daily activities as much as possible.  Limit napping during the day.  Limit caffeine.  Attend social events that stimulate rather than overwhelm the senses.  Encourage good nutrition and hydration.  Reduce distractions during meal times and snacks.  Avoid foods that are too hot or too cold.  Monitor chewing and swallowing ability.  Continue with routine vision, hearing, dental, and medical screenings.  Give medicines only as directed by the health care provider.  Monitor driving abilities. Do not allow the person to drive when safe driving is no longer possible.  Register with an identification  program which could provide location assistance in the event of a missing person situation. SEEK MEDICAL CARE IF:   New behavioral problems start such as moodiness, aggressiveness, or seeing things that are not there (hallucinations).  Any new problem with brain function happens. This includes problems with balance, speech, or falling a lot.  Problems with swallowing develop.  Any symptoms of other illness happen. Small changes or worsening in any aspect of brain function can be a sign that the illness is getting worse. It can also be a sign of another medical illness such as infection. Seeing a health care provider right away is important. SEEK IMMEDIATE MEDICAL CARE IF:   A fever develops.  New or worsened confusion develops.  New or worsened sleepiness develops.  Staying awake becomes hard to do. Document Released: 02/26/2001 Document Revised: 01/17/2014 Document Reviewed: 01/28/2011 [facility] Patient Information 2015 [place], [place]. This information is not intended to replace advice given to you by your health care provider. Make sure you discuss any questions you have with your health care provider.

## 2015-03-11 NOTE — ED Provider Notes (Signed)
CSN: [id number]     Arrival date & time 03/11/15  1510 History   First MD Initiated Contact with Patient 03/11/15 1516     Chief Complaint  Patient presents with  . Altered Mental Status     (Consider location/radiation/quality/duration/timing/severity/associated sxs/prior Treatment) The history is provided by the patient and the EMS personnel.  Patient w hx copd, arrives via ems, indicating family wanted her checked as she was difficult to awaken this AM.  Pt states she feels fine, other than being thirsty and hungry.  Pt indicates she hasnt eaten yet today.  Pt denies other specific physical c/o. No pain - no headache, no cp or discomfort, no abd pain.  Denies recent trauma or fall. No syncope. No unusual doe, is on home o2 at baseline.  Pt states had similar symptoms when had uti in past. No dysuria, urinating normal amount. Denies wt change. No fever or chills. No cough or uri c/o. No nvd. Denies recent change in meds, and indicates is compliant w normal meds.     Past Medical History  Diagnosis Date  . COPD (chronic obstructive pulmonary disease)   . Arthritis   . Cancer   . Breast cancer     right  . Hypertension   . AKI (acute kidney injury) 10/01/2013  . Shortness of breath   . Diabetes mellitus without complication     TYPE 2   Past Surgical History  Procedure Laterality Date  . Laparoscopic hysterectomy    . Appendectomy    . Laparoscopic cholecystectomy    . Breast surgery Right lumpectomy   Family History  Problem Relation Age of Onset  . Lung cancer Father   . Heart disease Brother    History  Substance Use Topics  . Smoking status: Former Smoker -- 2.00 packs/day for 30 years    Types: Cigarettes    Quit date: 02/20/1980  . Smokeless tobacco: Never Used  . Alcohol Use: No   OB History    No data available     Review of Systems  Constitutional: Negative for fever and chills.  HENT: Negative for sore throat.   Eyes: Negative for pain and visual  disturbance.  Respiratory: Negative for cough and shortness of breath.   Cardiovascular: Negative for chest pain.  Gastrointestinal: Negative for vomiting, abdominal pain and diarrhea.  Endocrine: Negative for polyuria.  Genitourinary: Negative for dysuria and flank pain.  Musculoskeletal: Negative for back pain and neck pain.  Skin: Negative for rash.  Neurological: Negative for syncope, speech difficulty, weakness, numbness and headaches.  Hematological: Does not bruise/bleed easily.  Psychiatric/Behavioral: Negative for confusion.      Allergies  Tetanus toxoids; Latex; and Penicillins  Home Medications   Prior to Admission medications   Medication Sig Start Date End Date Taking? Authorizing Provider  acetaminophen (TYLENOL) 500 MG tablet Take 1,000 mg by mouth every 6 (six) hours as needed for mild pain (back pain).     Historical Provider, MD  albuterol (PROVENTIL HFA;VENTOLIN HFA) 108 (90 BASE) MCG/ACT inhaler Inhale 2 puffs into the lungs every 6 (six) hours as needed for wheezing or shortness of breath. 10/04/13   Modena Jansky, MD  albuterol (PROVENTIL) (2.5 MG/3ML) 0.083% nebulizer solution Take 2.5 mg by nebulization every 6 (six) hours.    Historical Provider, MD  aspirin EC 81 MG tablet Take 81 mg by mouth every evening.    Historical Provider, MD  benazepril (LOTENSIN) 40 MG tablet Take 40 mg by mouth daily.  Historical Provider, MD  budesonide (PULMICORT) 0.5 MG/2ML nebulizer solution Take 0.5 mg by nebulization 2 (two) times daily.    Historical Provider, MD  CALCIUM PO Take 1 tablet by mouth at bedtime.     Historical Provider, MD  Cholecalciferol (VITAMIN D PO) Take 1 capsule by mouth every evening.    Historical Provider, MD  colchicine 0.6 MG tablet Take 1 tablet (0.6 mg total) by mouth daily. 08/12/14   Hosie Poisson, MD  diphenhydramine-acetaminophen (TYLENOL PM) 25-500 MG TABS Take 1.5 tablets by mouth at bedtime.    Historical Provider, MD  furosemide  (LASIX) 40 MG tablet Take 40 mg by mouth daily.     Historical Provider, MD  Ketotifen Fumarate (ALAWAY OP) Apply 1 drop to eye 2 (two) times daily as needed (itchy eyes). Both eyes For itchy eyes    Historical Provider, MD  levofloxacin (LEVAQUIN) 750 MG tablet Take 1 tablet (750 mg total) by mouth daily. 08/12/14   Hosie Poisson, MD  Magnesium 250 MG TABS Take 1 tablet by mouth at bedtime.     Historical Provider, MD  metoprolol succinate (TOPROL-XL) 50 MG 24 hr tablet Take 25 mg by mouth daily. Take with or immediately following a meal.    Historical Provider, MD  Multiple Vitamins-Minerals (PRESERVISION AREDS) CAPS Take 1 capsule by mouth 2 (two) times daily.    Historical Provider, MD  Omega-3 Fatty Acids (FISH OIL PO) Take 1 capsule by mouth every evening.    Historical Provider, MD  potassium chloride (K-DUR,KLOR-CON) 10 MEQ tablet Take 10 mEq by mouth at bedtime.     Historical Provider, MD  promethazine (PHENERGAN) 25 MG tablet Take 12.5 mg by mouth every 6 (six) hours as needed for nausea or vomiting (nausea).    Historical Provider, MD  sertraline (ZOLOFT) 50 MG tablet Take 50 mg by mouth daily.  10/07/13   Historical Provider, MD  tiotropium (SPIRIVA HANDIHALER) 18 MCG inhalation capsule Place 1 capsule (18 mcg total) into inhaler and inhale daily. 10/04/13   Modena Jansky, MD  traMADol (ULTRAM) 50 MG tablet Take 1 tablet (50 mg total) by mouth every 6 (six) hours as needed. 08/12/14   Hosie Poisson, MD  verapamil (CALAN) 120 MG tablet Take 60 mg by mouth 2 (two) times daily.     Historical Provider, MD   BP 130/91 mmHg  Pulse 77  Temp(Src) 98.4 F (36.9 C) (Oral)  Resp 16  SpO2 98% Physical Exam  Constitutional: She appears well-developed and well-nourished. No distress.  HENT:  Head: Atraumatic.  Nose: Nose normal.  Mouth/Throat: Oropharynx is clear and moist.  Eyes: Conjunctivae are normal. Pupils are equal, round, and reactive to light. No scleral icterus.  Neck: Normal  range of motion. Neck supple. No tracheal deviation present.  No stiffness or rigidity. No bruits.   Cardiovascular: Normal rate, regular rhythm, normal heart sounds and intact distal pulses.  Exam reveals no gallop and no friction rub.   No murmur heard. Pulmonary/Chest: Effort normal and breath sounds normal. No respiratory distress.  Abdominal: Soft. Normal appearance and bowel sounds are normal. She exhibits no distension and no mass. There is no tenderness. There is no rebound and no guarding.  Genitourinary:  No cva tenderness.  Musculoskeletal: She exhibits no edema or tenderness.  Neurological: She is alert.  Oriented to person, place, and day. No facial asymmetry or weakness.  Moves bil extremities purposefully w good strength. stre 5/5 bil. sens grossly intact.   Skin: Skin is  warm and dry. No rash noted. She is not diaphoretic.  Psychiatric: She has a normal mood and affect.  Nursing note and vitals reviewed.   ED Course  Procedures (including critical care time) Labs Review   Results for orders placed or performed during the hospital encounter of 03/11/15  CBC  Result Value Ref Range   WBC 12.0 (H) 4.0 - 10.5 K/uL   RBC 4.62 3.87 - 5.11 MIL/uL   Hemoglobin 13.5 12.0 - 15.0 g/dL   HCT 43.6 36.0 - 46.0 %   MCV 94.4 78.0 - 100.0 fL   MCH 29.2 26.0 - 34.0 pg   MCHC 31.0 30.0 - 36.0 g/dL   RDW 15.3 11.5 - 15.5 %   Platelets 194 150 - 400 K/uL  Comprehensive metabolic panel  Result Value Ref Range   Sodium 139 135 - 145 mmol/L   Potassium 3.8 3.5 - 5.1 mmol/L   Chloride 100 (L) 101 - 111 mmol/L   CO2 30 22 - 32 mmol/L   Glucose, Bld 130 (H) 65 - 99 mg/dL   BUN 15 6 - 20 mg/dL   Creatinine, Ser 0.94 0.44 - 1.00 mg/dL   Calcium 9.5 8.9 - 10.3 mg/dL   Total Protein 6.9 6.5 - 8.1 g/dL   Albumin 3.5 3.5 - 5.0 g/dL   AST 15 15 - 41 U/L   ALT 14 14 - 54 U/L   Alkaline Phosphatase 107 38 - 126 U/L   Total Bilirubin 0.9 0.3 - 1.2 mg/dL   GFR calc non Af Amer 52 (L) >60  mL/min   GFR calc Af Amer >60 >60 mL/min   Anion gap 9 5 - 15  Urinalysis, Routine w reflex microscopic (not at [facility])  Result Value Ref Range   Color, Urine YELLOW YELLOW   APPearance CLOUDY (A) CLEAR   Specific Gravity, Urine 1.014 1.005 - 1.030   pH 7.0 5.0 - 8.0   Glucose, UA NEGATIVE NEGATIVE mg/dL   Hgb urine dipstick NEGATIVE NEGATIVE   Bilirubin Urine NEGATIVE NEGATIVE   Ketones, ur NEGATIVE NEGATIVE mg/dL   Protein, ur NEGATIVE NEGATIVE mg/dL   Urobilinogen, UA 0.2 0.0 - 1.0 mg/dL   Nitrite NEGATIVE NEGATIVE   Leukocytes, UA SMALL (A) NEGATIVE  Urine microscopic-add on  Result Value Ref Range   Squamous Epithelial / LPF RARE RARE   WBC, UA 0-2 <3 WBC/hpf   Ct Head Wo Contrast  03/11/2015   CLINICAL DATA:  Lethargy and increased confusion since this morning  EXAM: CT HEAD WITHOUT CONTRAST  TECHNIQUE: Contiguous axial images were obtained from the base of the skull through the vertex without intravenous contrast.  COMPARISON:  12/20/2014  FINDINGS: Stable hyper attenuating 2 cm mass posterior left posterior fossa. Stable diffuse atrophy and severe low attenuation in the deep white matter consistent with chronic ischemic change. No evidence of hemorrhage or extra-axial fluid. No vascular territory infarct. Inflammatory change, mild, left maxillary sinus, nonacute. No skull fracture.  IMPRESSION: No acute intracranial abnormality. Stable partially calcified meningioma on the left.   Electronically Signed   By: Skipper Cliche M.D.   On: 03/11/2015 19:57      MDM   Iv ns. Labs.  Reviewed nursing notes and prior charts for additional history.   Family present, they note progressively worsening dementia in the past 1-2 years. States pts mood waxes and wanes.   Periodically/occasionally they will give dose of klonopin if/when pt has agitated behavior.  Pt was given dose last night, and  then slept longer than normal this morning.   Family indicates they have had home health set up  in the past, but felt the OT/PT/aid of no benefit and that they are able to provide better care.  Pt did eat/drink in the ED.  Pt awake and alert.   Ct neg acute. ua w 0-2 wbc. No cough or increased wob. No fevers.   On recheck, pts mental status c/w baseline.   Pt currently appears stable for d/c.  Pt already has pcp follow up arranged for this Monday.  Return precautions provided.     Lajean Saver, MD 03/11/15 2010

## 2015-03-11 NOTE — ED Notes (Signed)
Awake. Verbally responsive. A/O x4. Resp even and unlabored. No audible adventitious breath sounds noted. ABC's intact. IV saline lock patent and intact. Family at bedside and informed of progress and waiting status. Verbalized understanding.

## 2015-03-13 ENCOUNTER — Other Ambulatory Visit: Payer: Self-pay

## 2015-04-03 NOTE — Patient Outreach (Signed)
[facility]) Care Management  04/03/2015  Adrienne [name] [birth year] [id number]   Referral from [facility], [name], RN assigned to outreach.  [name]. [place], [place] Management [place] Assistant Phone: [phone] Fax: [phone]

## 2015-04-26 ENCOUNTER — Other Ambulatory Visit: Payer: Self-pay | Admitting: *Deleted

## 2015-04-26 NOTE — Patient Outreach (Signed)
Referral received from [facility].  Member has history of CHF and COPD, and has had 2 inpatient admits, 1 Emergency department visit, and 1 skilled nursing facility admit in the past year.    Call placed to member, however member's daughter answers the phone.  Daughter, [place], verifies member's identity.  This care manager introduces self and states the purpose of call.  [facility] services explained, daughter states that they are not interested in the services at this time but will keep the [facility] name in mind should they need additional resources.  Will notify care manager assistant that member/family has declined services.  Valente David, BSN, [place] Management  [facility] Care Manager [phone]

## 2015-04-27 NOTE — Patient Outreach (Signed)
[place] [facility]) Care Management  04/27/2015  Adrienne [name] 1924/01/08 [id number]   Notification received from [facility], [facility] to close case due to refusal of services from patient and family.  [name] L. Teresita Fanton, [place] Care Management Assistant

## 2015-08-12 ENCOUNTER — Inpatient Hospital Stay (HOSPITAL_COMMUNITY)
Admission: EM | Admit: 2015-08-12 | Discharge: 2015-08-18 | DRG: 871 | Disposition: A | Discharge status: Skilled Nursing Facility | Payer: Medicare Other | Attending: Internal Medicine | Admitting: Internal Medicine

## 2015-08-12 DIAGNOSIS — J449 Chronic obstructive pulmonary disease, unspecified: Secondary | ICD-10-CM | POA: Diagnosis present

## 2015-08-12 DIAGNOSIS — J189 Pneumonia, unspecified organism: Secondary | ICD-10-CM | POA: Diagnosis not present

## 2015-08-12 DIAGNOSIS — N179 Acute kidney failure, unspecified: Secondary | ICD-10-CM | POA: Diagnosis present

## 2015-08-12 DIAGNOSIS — D696 Thrombocytopenia, unspecified: Secondary | ICD-10-CM | POA: Diagnosis present

## 2015-08-12 DIAGNOSIS — A419 Sepsis, unspecified organism: Secondary | ICD-10-CM | POA: Diagnosis not present

## 2015-08-12 DIAGNOSIS — Z853 Personal history of malignant neoplasm of breast: Secondary | ICD-10-CM

## 2015-08-12 DIAGNOSIS — Z91040 Latex allergy status: Secondary | ICD-10-CM

## 2015-08-12 DIAGNOSIS — E669 Obesity, unspecified: Secondary | ICD-10-CM | POA: Diagnosis present

## 2015-08-12 DIAGNOSIS — E872 Acidosis: Secondary | ICD-10-CM | POA: Diagnosis present

## 2015-08-12 DIAGNOSIS — Z6831 Body mass index (BMI) 31.0-31.9, adult: Secondary | ICD-10-CM

## 2015-08-12 DIAGNOSIS — E119 Type 2 diabetes mellitus without complications: Secondary | ICD-10-CM | POA: Diagnosis present

## 2015-08-12 DIAGNOSIS — I5032 Chronic diastolic (congestive) heart failure: Secondary | ICD-10-CM | POA: Diagnosis present

## 2015-08-12 DIAGNOSIS — Z79899 Other long term (current) drug therapy: Secondary | ICD-10-CM

## 2015-08-12 DIAGNOSIS — Z7982 Long term (current) use of aspirin: Secondary | ICD-10-CM

## 2015-08-12 DIAGNOSIS — Z87891 Personal history of nicotine dependence: Secondary | ICD-10-CM

## 2015-08-12 DIAGNOSIS — Z880 Allergy status to penicillin: Secondary | ICD-10-CM

## 2015-08-12 DIAGNOSIS — R571 Hypovolemic shock: Secondary | ICD-10-CM | POA: Diagnosis present

## 2015-08-12 DIAGNOSIS — M1990 Unspecified osteoarthritis, unspecified site: Secondary | ICD-10-CM | POA: Diagnosis present

## 2015-08-12 DIAGNOSIS — Z887 Allergy status to serum and vaccine status: Secondary | ICD-10-CM

## 2015-08-12 DIAGNOSIS — I110 Hypertensive heart disease with heart failure: Secondary | ICD-10-CM | POA: Diagnosis present

## 2015-08-12 DIAGNOSIS — J9601 Acute respiratory failure with hypoxia: Secondary | ICD-10-CM | POA: Diagnosis present

## 2015-08-12 DIAGNOSIS — R197 Diarrhea, unspecified: Secondary | ICD-10-CM | POA: Diagnosis present

## 2015-08-12 DIAGNOSIS — R159 Full incontinence of feces: Secondary | ICD-10-CM | POA: Diagnosis present

## 2015-08-12 NOTE — ED Provider Notes (Signed)
CSN: [id number]     Arrival date & time 08/12/15  2351 History   By signing my name below, I, Adrienne [name], attest that this documentation has been prepared under the direction and in the presence of Adrienne Schmidt, MD.  Electronically Signed: Forrestine [name], ED Scribe. 08/12/2015. 12:03 AM.   Chief Complaint  Patient presents with  . Altered Mental Status  . Abdominal Pain   The history is provided by the EMS personnel. No language interpreter was used.    LEVEL 5 CAVEAT DUE TO ALTERED MENTAL STATUS   HPI Comments: Adrienne [name] brought in by EMS from home is a 81 y.o. female who presents to the Emergency Department here for altered mental status this evening. Per nurses note and report, pt had a syncopal episode against her toilet earlier this evening. Ongoing generalized weakness and lethargic reported en route. However, pt was given a dose of Ativan at approximately 7:00 PM this evening. Per EMS, pt was hypotensive at 80/40 upon arrival. Pt also had an episode of bowel incontinence after arriving to department.  PCP: Adrienne Sheller, MD    Past Medical History  Diagnosis Date  . COPD (chronic obstructive pulmonary disease)   . Arthritis   . Cancer   . Breast cancer     right  . Hypertension   . AKI (acute kidney injury) 10/01/2013  . Shortness of breath   . Diabetes mellitus without complication     TYPE 2   Past Surgical History  Procedure Laterality Date  . Laparoscopic hysterectomy    . Appendectomy    . Laparoscopic cholecystectomy    . Breast surgery Right lumpectomy   Family History  Problem Relation Age of Onset  . Lung cancer Father   . Heart disease Brother    Social History  Substance Use Topics  . Smoking status: Former Smoker -- 2.00 packs/day for 30 years    Types: Cigarettes    Quit date: 02/20/1980  . Smokeless tobacco: Never Used  . Alcohol Use: No   OB History    No data available     Review of Systems  Unable to perform ROS: Mental  status change      Allergies  Tetanus toxoids; Latex; and Penicillins  Home Medications   Prior to Admission medications   Medication Sig Start Date End Date Taking? Authorizing Provider  acetaminophen (TYLENOL) 500 MG tablet Take 1,000 mg by mouth every 6 (six) hours as needed for mild pain (back pain).     Historical Provider, MD  albuterol (PROVENTIL HFA;VENTOLIN HFA) 108 (90 BASE) MCG/ACT inhaler Inhale 2 puffs into the lungs every 6 (six) hours as needed for wheezing or shortness of breath. 10/04/13   Modena Jansky, MD  albuterol (PROVENTIL) (2.5 MG/3ML) 0.083% nebulizer solution Take 2.5 mg by nebulization 2 (two) times daily.     Historical Provider, MD  aspirin EC 81 MG tablet Take 81 mg by mouth every evening.    Historical Provider, MD  benazepril (LOTENSIN) 40 MG tablet Take 40 mg by mouth daily.    Historical Provider, MD  CALCIUM PO Take 1 tablet by mouth at bedtime.     Historical Provider, MD  clonazePAM (KLONOPIN) 0.5 MG disintegrating tablet Take 0.5 mg by mouth daily as needed (anxiety).  03/06/15   Historical Provider, MD  colchicine 0.6 MG tablet Take 1 tablet (0.6 mg total) by mouth daily. Patient not taking: Reported on 03/11/2015 08/12/14   Hosie Poisson, MD  diphenhydramine-acetaminophen (TYLENOL  PM) 25-500 MG TABS Take 1.5 tablets by mouth at bedtime as needed (pain).     Historical Provider, MD  Docusate Calcium (STOOL SOFTENER PO) Take 1 tablet by mouth at bedtime as needed (constipation).    Historical Provider, MD  furosemide (LASIX) 40 MG tablet Take 40 mg by mouth daily.     Historical Provider, MD  Ketotifen Fumarate (ALAWAY OP) Apply 1 drop to eye 2 (two) times daily as needed (itchy eyes). Both eyes For itchy eyes    Historical Provider, MD  levofloxacin (LEVAQUIN) 750 MG tablet Take 1 tablet (750 mg total) by mouth daily. Patient not taking: Reported on 03/11/2015 08/12/14   Hosie Poisson, MD  Magnesium 250 MG TABS Take 1 tablet by mouth at bedtime.      Historical Provider, MD  metoprolol succinate (TOPROL-XL) 50 MG 24 hr tablet Take 25 mg by mouth daily. Take with or immediately following a meal.    Historical Provider, MD  Multiple Vitamins-Minerals (PRESERVISION AREDS) CAPS Take 1 capsule by mouth 2 (two) times daily.    Historical Provider, MD  potassium chloride (K-DUR,KLOR-CON) 10 MEQ tablet Take 10 mEq by mouth at bedtime.     Historical Provider, MD  promethazine (PHENERGAN) 25 MG tablet Take 12.5 mg by mouth every 6 (six) hours as needed for nausea or vomiting (nausea).    Historical Provider, MD  sertraline (ZOLOFT) 50 MG tablet Take 50 mg by mouth daily.  10/07/13   Historical Provider, MD  tiotropium (SPIRIVA HANDIHALER) 18 MCG inhalation capsule Place 1 capsule (18 mcg total) into inhaler and inhale daily. 10/04/13   Modena Jansky, MD  traMADol (ULTRAM) 50 MG tablet Take 1 tablet (50 mg total) by mouth every 6 (six) hours as needed. Patient not taking: Reported on 03/11/2015 08/12/14   Hosie Poisson, MD  verapamil (CALAN) 120 MG tablet Take 60 mg by mouth 2 (two) times daily.     Historical Provider, MD   Triage Vitals: BP 87/49 mmHg  Pulse 79  Temp(Src) 97.5 F (36.4 C) (Oral)  Resp 20  Wt 190 lb (86.183 kg)  SpO2 95%   Physical Exam  Constitutional: She is oriented to person, place, and time. She appears well-developed and well-nourished. No distress.  HENT:  Head: Normocephalic and atraumatic.  Eyes: EOM are normal.  Neck: Normal range of motion.  Cardiovascular: Normal rate, regular rhythm and normal heart sounds.   Pulmonary/Chest: Effort normal and breath sounds normal.  Abdominal: Soft. She exhibits no distension. There is no tenderness.  Musculoskeletal: Normal range of motion.  Full ROM of bilateral hips  Neurological: She is alert and oriented to person, place, and time.  Follows commands Moves all 4 extremities  Skin: Skin is warm and dry.  Psychiatric: She has a normal mood and affect. Judgment normal.   Nursing note and vitals reviewed.   ED Course  Procedures (including critical care time)  DIAGNOSTIC STUDIES: Oxygen Saturation is 95% on RA, adequate by my interpretation.    COORDINATION OF CARE: 11:58 PM- Will give fluids. Will order CXR, urine culture, troponin I, i-stat CG4 lactic acid, CMP, EKG, CBG, and urinalysis. Discussed treatment plan with pt at bedside and pt agreed to plan.  a   Labs Review Labs Reviewed  COMPREHENSIVE METABOLIC PANEL - Abnormal; Notable for the following:    CO2 18 (*)    Glucose, Bld 176 (*)    Creatinine, Ser 1.51 (*)    Total Protein 5.9 (*)    Albumin  3.0 (*)    GFR calc non Af Amer 29 (*)    GFR calc Af Amer 34 (*)    All other components within normal limits  CBC - Abnormal; Notable for the following:    WBC 16.6 (*)    RDW 15.7 (*)    All other components within normal limits  CBG MONITORING, ED - Abnormal; Notable for the following:    Glucose-Capillary 163 (*)    All other components within normal limits  I-STAT CG4 LACTIC ACID, ED - Abnormal; Notable for the following:    Lactic Acid, Venous 5.29 (*)    All other components within normal limits  CULTURE, BLOOD (ROUTINE X 2)  CULTURE, BLOOD (ROUTINE X 2)  URINE CULTURE  URINALYSIS, ROUTINE W REFLEX MICROSCOPIC (NOT AT [facility])  TROPONIN I   BUN  Date Value Ref Range Status  08/13/2015 18 6 - 20 mg/dL Final  03/11/2015 15 6 - 20 mg/dL Final  12/20/2014 26* 6 - 23 mg/dL Final  08/10/2014 18 6 - 23 mg/dL Final   CREATININE, SER  Date Value Ref Range Status  08/13/2015 1.51* 0.44 - 1.00 mg/dL Final  03/11/2015 0.94 0.44 - 1.00 mg/dL Final  12/20/2014 1.56* 0.50 - 1.10 mg/dL Final  08/10/2014 0.89 0.50 - 1.10 mg/dL Final       Imaging Review Dg Chest Portable 1 View  08/13/2015  CLINICAL DATA:  Hypotension.  Altered mental status. EXAM: PORTABLE CHEST 1 VIEW COMPARISON:  08/08/2014 FINDINGS: Shallow inspiration with elevation of right hemidiaphragm. Developing atelectasis  or infiltration in the right lung base since previous study. Developing pneumonia is not excluded. Left lung appears expanded without focal consolidation demonstrated on the left. No blunting of costophrenic angles. No pneumothorax. Normal heart size and pulmonary vascularity for technique. IMPRESSION: Elevation of right hemidiaphragm with increasing atelectasis or infiltration in the right lung base since previous study. Electronically Signed   By: Lucienne Capers M.D.   On: 08/13/2015 00:55   I have personally reviewed and evaluated these images and lab results as part of my medical decision-making.   EKG Interpretation   Date/Time:  Sunday August 13 2015 00:09:34 EST Ventricular Rate:  79 PR Interval:  179 QRS Duration: 81 QT Interval:  431 QTC Calculation: 494 R Axis:   26 Text Interpretation:  Sinus rhythm Abnormal R-wave progression, early  transition Borderline prolonged QT interval No significant change was  found Confirmed by Xander Jutras  MD, Aziah Brostrom ([id number]) on 08/13/2015 2:29:50 AM      MDM   Final diagnoses:  CAP (community acquired pneumonia)    [id number] AM Systolic blood pressure [id number] after 2 L IV fluids.  Lactate will be repeated at this time.  Patient appears to have community-acquired pneumonia likely is the cause of her generalized weakness.  She also has diarrhea.  Abdominal exam is benign.  She'll be treated for community acquired pneumonia.  Lactate is going to be repeated at this time.  Given the elevated lactate this is likely occult sepsis.  I do not think she needs placement in the intensive care unit this time.  Patient will be admitted to stepdown.  Troponin 0.05.  EKG without ischemic changes.  No chest pain or anginal symptoms at this time.  I personally performed the services described in this documentation, which was scribed in my presence. The recorded information has been reviewed and is accurate.       Adrienne Schmidt, MD 08/13/15 [phone]

## 2015-08-13 ENCOUNTER — Emergency Department (HOSPITAL_COMMUNITY): Payer: Medicare Other

## 2015-08-13 ENCOUNTER — Encounter (HOSPITAL_COMMUNITY): Payer: Self-pay

## 2015-08-13 DIAGNOSIS — I110 Hypertensive heart disease with heart failure: Secondary | ICD-10-CM | POA: Diagnosis present

## 2015-08-13 DIAGNOSIS — I5032 Chronic diastolic (congestive) heart failure: Secondary | ICD-10-CM | POA: Diagnosis present

## 2015-08-13 DIAGNOSIS — D696 Thrombocytopenia, unspecified: Secondary | ICD-10-CM | POA: Diagnosis present

## 2015-08-13 DIAGNOSIS — I959 Hypotension, unspecified: Secondary | ICD-10-CM | POA: Insufficient documentation

## 2015-08-13 DIAGNOSIS — J189 Pneumonia, unspecified organism: Secondary | ICD-10-CM | POA: Diagnosis present

## 2015-08-13 DIAGNOSIS — Z87891 Personal history of nicotine dependence: Secondary | ICD-10-CM | POA: Diagnosis not present

## 2015-08-13 DIAGNOSIS — Z853 Personal history of malignant neoplasm of breast: Secondary | ICD-10-CM | POA: Diagnosis not present

## 2015-08-13 DIAGNOSIS — E669 Obesity, unspecified: Secondary | ICD-10-CM | POA: Diagnosis present

## 2015-08-13 DIAGNOSIS — Z91040 Latex allergy status: Secondary | ICD-10-CM | POA: Diagnosis not present

## 2015-08-13 DIAGNOSIS — J449 Chronic obstructive pulmonary disease, unspecified: Secondary | ICD-10-CM | POA: Diagnosis present

## 2015-08-13 DIAGNOSIS — R197 Diarrhea, unspecified: Secondary | ICD-10-CM | POA: Diagnosis present

## 2015-08-13 DIAGNOSIS — Z6831 Body mass index (BMI) 31.0-31.9, adult: Secondary | ICD-10-CM | POA: Diagnosis not present

## 2015-08-13 DIAGNOSIS — A419 Sepsis, unspecified organism: Secondary | ICD-10-CM | POA: Diagnosis present

## 2015-08-13 DIAGNOSIS — R571 Hypovolemic shock: Secondary | ICD-10-CM | POA: Diagnosis present

## 2015-08-13 DIAGNOSIS — J438 Other emphysema: Secondary | ICD-10-CM | POA: Diagnosis not present

## 2015-08-13 DIAGNOSIS — R159 Full incontinence of feces: Secondary | ICD-10-CM | POA: Diagnosis present

## 2015-08-13 DIAGNOSIS — Z887 Allergy status to serum and vaccine status: Secondary | ICD-10-CM | POA: Diagnosis not present

## 2015-08-13 DIAGNOSIS — M1990 Unspecified osteoarthritis, unspecified site: Secondary | ICD-10-CM | POA: Diagnosis present

## 2015-08-13 DIAGNOSIS — N179 Acute kidney failure, unspecified: Secondary | ICD-10-CM | POA: Diagnosis present

## 2015-08-13 DIAGNOSIS — Z79899 Other long term (current) drug therapy: Secondary | ICD-10-CM | POA: Diagnosis not present

## 2015-08-13 DIAGNOSIS — E872 Acidosis: Secondary | ICD-10-CM | POA: Diagnosis present

## 2015-08-13 DIAGNOSIS — Z7982 Long term (current) use of aspirin: Secondary | ICD-10-CM | POA: Diagnosis not present

## 2015-08-13 DIAGNOSIS — Z880 Allergy status to penicillin: Secondary | ICD-10-CM | POA: Diagnosis not present

## 2015-08-13 DIAGNOSIS — J9601 Acute respiratory failure with hypoxia: Secondary | ICD-10-CM | POA: Diagnosis present

## 2015-08-13 DIAGNOSIS — E119 Type 2 diabetes mellitus without complications: Secondary | ICD-10-CM | POA: Diagnosis present

## 2015-08-13 LAB — BASIC METABOLIC PANEL
Anion gap: 5 (ref 5–15)
BUN: 22 mg/dL — AB (ref 6–20)
CALCIUM: 8.9 mg/dL (ref 8.9–10.3)
CO2: 23 mmol/L (ref 22–32)
Chloride: 109 mmol/L (ref 101–111)
Creatinine, Ser: 1.14 mg/dL — ABNORMAL HIGH (ref 0.44–1.00)
GFR calc Af Amer: 48 mL/min — ABNORMAL LOW (ref >60)
GFR, EST NON AFRICAN AMERICAN: 41 mL/min — AB (ref >60)
GLUCOSE: 128 mg/dL — AB (ref 65–99)
Potassium: 3.7 mmol/L (ref 3.5–5.1)
Sodium: 137 mmol/L (ref 135–145)

## 2015-08-13 LAB — COMPREHENSIVE METABOLIC PANEL
ALBUMIN: 3 g/dL — AB (ref 3.5–5.0)
ALT: 18 U/L (ref 14–54)
AST: 28 U/L (ref 15–41)
Alkaline Phosphatase: 94 U/L (ref 38–126)
Anion gap: 12 (ref 5–15)
BUN: 18 mg/dL (ref 6–20)
CHLORIDE: 107 mmol/L (ref 101–111)
CO2: 18 mmol/L — AB (ref 22–32)
Calcium: 9.6 mg/dL (ref 8.9–10.3)
Creatinine, Ser: 1.51 mg/dL — ABNORMAL HIGH (ref 0.44–1.00)
GFR calc Af Amer: 34 mL/min — ABNORMAL LOW (ref >60)
GFR calc non Af Amer: 29 mL/min — ABNORMAL LOW (ref >60)
Glucose, Bld: 176 mg/dL — ABNORMAL HIGH (ref 65–99)
POTASSIUM: 4 mmol/L (ref 3.5–5.1)
SODIUM: 137 mmol/L (ref 135–145)
Total Bilirubin: 0.5 mg/dL (ref 0.3–1.2)
Total Protein: 5.9 g/dL — ABNORMAL LOW (ref 6.5–8.1)

## 2015-08-13 LAB — TROPONIN I

## 2015-08-13 LAB — C DIFFICILE QUICK SCREEN W PCR REFLEX
C DIFFICILE (CDIFF) INTERP: NEGATIVE
C DIFFICILE (CDIFF) TOXIN: NEGATIVE
C Diff antigen: NEGATIVE

## 2015-08-13 LAB — CBG MONITORING, ED: Glucose-Capillary: 163 mg/dL — ABNORMAL HIGH (ref 65–99)

## 2015-08-13 LAB — CBC
HEMATOCRIT: 45.2 % (ref 36.0–46.0)
Hemoglobin: 14.3 g/dL (ref 12.0–15.0)
MCH: 30.4 pg (ref 26.0–34.0)
MCHC: 31.6 g/dL (ref 30.0–36.0)
MCV: 96 fL (ref 78.0–100.0)
PLATELETS: 186 10*3/uL (ref 150–400)
RBC: 4.71 MIL/uL (ref 3.87–5.11)
RDW: 15.7 % — AB (ref 11.5–15.5)
WBC: 16.6 10*3/uL — AB (ref 4.0–10.5)

## 2015-08-13 LAB — I-STAT CG4 LACTIC ACID, ED
LACTIC ACID, VENOUS: 5.29 mmol/L — AB (ref 0.5–2.0)
Lactic Acid, Venous: 3.4 mmol/L (ref 0.5–2.0)

## 2015-08-13 LAB — URINALYSIS, ROUTINE W REFLEX MICROSCOPIC
BILIRUBIN URINE: NEGATIVE
Glucose, UA: NEGATIVE mg/dL
Hgb urine dipstick: NEGATIVE
KETONES UR: NEGATIVE mg/dL
LEUKOCYTES UA: NEGATIVE
NITRITE: NEGATIVE
PH: 6.5 (ref 5.0–8.0)
Protein, ur: NEGATIVE mg/dL
SPECIFIC GRAVITY, URINE: 1.011 (ref 1.005–1.030)

## 2015-08-13 LAB — LACTIC ACID, PLASMA
LACTIC ACID, VENOUS: 1.7 mmol/L (ref 0.5–2.0)
LACTIC ACID, VENOUS: 1.2 mmol/L (ref 0.5–2.0)

## 2015-08-13 LAB — GLUCOSE, CAPILLARY: Glucose-Capillary: 89 mg/dL (ref 65–99)

## 2015-08-13 LAB — PROCALCITONIN

## 2015-08-13 LAB — MRSA PCR SCREENING: MRSA BY PCR: POSITIVE — AB

## 2015-08-13 MED ORDER — ENOXAPARIN SODIUM 30 MG/0.3ML ~~LOC~~ SOLN
30.0000 mg | Freq: Every day | SUBCUTANEOUS | Status: DC
  Administered 2015-08-13 – 2015-08-14 (×2): 30 mg via SUBCUTANEOUS
  Filled 2015-08-13 (×2): qty 0.3

## 2015-08-13 MED ORDER — SODIUM CHLORIDE 0.9 % IV BOLUS (SEPSIS): 500.0000 mL | Freq: Once | INTRAVENOUS | Status: DC

## 2015-08-13 MED ORDER — DEXTROSE 5 % IV SOLN
1.0000 g | INTRAVENOUS | Status: DC
  Administered 2015-08-14 – 2015-08-15 (×2): 1 g via INTRAVENOUS
  Filled 2015-08-13 (×2): qty 10

## 2015-08-13 MED ORDER — DEXTROSE 5 % IV SOLN
500.0000 mg | INTRAVENOUS | Status: DC
  Administered 2015-08-14 – 2015-08-15 (×2): 500 mg via INTRAVENOUS
  Filled 2015-08-13 (×2): qty 500

## 2015-08-13 MED ORDER — IPRATROPIUM-ALBUTEROL 0.5-2.5 (3) MG/3ML IN SOLN
3.0000 mL | Freq: Three times a day (TID) | RESPIRATORY_TRACT | Status: DC
  Administered 2015-08-14 – 2015-08-17 (×12): 3 mL via RESPIRATORY_TRACT
  Filled 2015-08-13 (×14): qty 3

## 2015-08-13 MED ORDER — QUETIAPINE FUMARATE 50 MG PO TABS
50.0000 mg | ORAL_TABLET | Freq: Two times a day (BID) | ORAL | Status: DC
  Administered 2015-08-13 – 2015-08-14 (×3): 50 mg via ORAL
  Filled 2015-08-13 (×2): qty 1
  Filled 2015-08-13: qty 2
  Filled 2015-08-13: qty 1

## 2015-08-13 MED ORDER — DEXTROSE 5 % IV SOLN
500.0000 mg | Freq: Once | INTRAVENOUS | Status: AC
  Administered 2015-08-13: 500 mg via INTRAVENOUS
  Filled 2015-08-13: qty 500

## 2015-08-13 MED ORDER — ACETAMINOPHEN 325 MG PO TABS: 650.0000 mg | ORAL_TABLET | Freq: Four times a day (QID) | ORAL | Status: DC | PRN

## 2015-08-13 MED ORDER — IPRATROPIUM-ALBUTEROL 0.5-2.5 (3) MG/3ML IN SOLN
3.0000 mL | Freq: Four times a day (QID) | RESPIRATORY_TRACT | Status: DC | PRN
  Administered 2015-08-15: 3 mL via RESPIRATORY_TRACT
  Filled 2015-08-13: qty 3

## 2015-08-13 MED ORDER — MUPIROCIN 2 % EX OINT
1.0000 "application " | TOPICAL_OINTMENT | Freq: Two times a day (BID) | CUTANEOUS | Status: AC
  Administered 2015-08-13 – 2015-08-17 (×10): 1 via NASAL
  Filled 2015-08-13 (×4): qty 22

## 2015-08-13 MED ORDER — IPRATROPIUM-ALBUTEROL 0.5-2.5 (3) MG/3ML IN SOLN
3.0000 mL | RESPIRATORY_TRACT | Status: DC
  Administered 2015-08-13 (×3): 3 mL via RESPIRATORY_TRACT
  Filled 2015-08-13 (×3): qty 3

## 2015-08-13 MED ORDER — ONDANSETRON HCL 4 MG PO TABS: 4.0000 mg | ORAL_TABLET | Freq: Four times a day (QID) | ORAL | Status: DC | PRN

## 2015-08-13 MED ORDER — LORAZEPAM 1 MG PO TABS: 1.0000 mg | ORAL_TABLET | Freq: Four times a day (QID) | ORAL | Status: DC | PRN

## 2015-08-13 MED ORDER — ONDANSETRON HCL 4 MG/2ML IJ SOLN: 4.0000 mg | Freq: Four times a day (QID) | INTRAMUSCULAR | Status: DC | PRN

## 2015-08-13 MED ORDER — ALUM & MAG HYDROXIDE-SIMETH 200-200-20 MG/5ML PO SUSP: 30.0000 mL | Freq: Four times a day (QID) | ORAL | Status: DC | PRN

## 2015-08-13 MED ORDER — HYDROMORPHONE HCL 1 MG/ML IJ SOLN: 0.5000 mg | INTRAMUSCULAR | Status: DC | PRN

## 2015-08-13 MED ORDER — BENAZEPRIL HCL 40 MG PO TABS
40.0000 mg | ORAL_TABLET | Freq: Every day | ORAL | Status: DC
  Administered 2015-08-13 – 2015-08-16 (×4): 40 mg via ORAL
  Filled 2015-08-13 (×2): qty 2
  Filled 2015-08-13 (×2): qty 1
  Filled 2015-08-13: qty 2

## 2015-08-13 MED ORDER — SODIUM CHLORIDE 0.9 % IV SOLN
INTRAVENOUS | Status: DC
  Administered 2015-08-13 – 2015-08-16 (×2): via INTRAVENOUS

## 2015-08-13 MED ORDER — DEXTROSE 5 % IV SOLN
1.0000 g | INTRAVENOUS | Status: DC
  Filled 2015-08-13: qty 10

## 2015-08-13 MED ORDER — SODIUM CHLORIDE 0.9 % IV SOLN
1000.0000 mL | Freq: Once | INTRAVENOUS | Status: AC
  Administered 2015-08-13: 1000 mL via INTRAVENOUS

## 2015-08-13 MED ORDER — SERTRALINE HCL 50 MG PO TABS
50.0000 mg | ORAL_TABLET | Freq: Every day | ORAL | Status: DC
  Administered 2015-08-13 – 2015-08-17 (×5): 50 mg via ORAL
  Filled 2015-08-13 (×6): qty 1

## 2015-08-13 MED ORDER — VERAPAMIL HCL 120 MG PO TABS
120.0000 mg | ORAL_TABLET | Freq: Two times a day (BID) | ORAL | Status: DC
  Administered 2015-08-13 – 2015-08-16 (×7): 120 mg via ORAL
  Filled 2015-08-13 (×11): qty 1

## 2015-08-13 MED ORDER — ACETAMINOPHEN 650 MG RE SUPP: 650.0000 mg | Freq: Four times a day (QID) | RECTAL | Status: DC | PRN

## 2015-08-13 MED ORDER — CETYLPYRIDINIUM CHLORIDE 0.05 % MT LIQD
7.0000 mL | Freq: Two times a day (BID) | OROMUCOSAL | Status: DC
  Administered 2015-08-13 – 2015-08-18 (×10): 7 mL via OROMUCOSAL

## 2015-08-13 MED ORDER — METOPROLOL SUCCINATE ER 25 MG PO TB24
25.0000 mg | ORAL_TABLET | Freq: Every day | ORAL | Status: DC
  Administered 2015-08-13 – 2015-08-16 (×4): 25 mg via ORAL
  Filled 2015-08-13 (×5): qty 1

## 2015-08-13 MED ORDER — DEXTROSE 5 % IV SOLN
500.0000 mg | INTRAVENOUS | Status: DC
  Filled 2015-08-13: qty 500

## 2015-08-13 MED ORDER — LOPERAMIDE HCL 2 MG PO CAPS
2.0000 mg | ORAL_CAPSULE | ORAL | Status: DC | PRN
  Administered 2015-08-13: 2 mg via ORAL
  Filled 2015-08-13: qty 1

## 2015-08-13 MED ORDER — DEXTROSE 5 % IV SOLN
1.0000 g | Freq: Once | INTRAVENOUS | Status: AC
  Administered 2015-08-13: 1 g via INTRAVENOUS
  Filled 2015-08-13: qty 10

## 2015-08-13 MED ORDER — CHLORHEXIDINE GLUCONATE CLOTH 2 % EX PADS
6.0000 | MEDICATED_PAD | Freq: Every day | CUTANEOUS | Status: AC
  Administered 2015-08-13 – 2015-08-16 (×3): 6 via TOPICAL

## 2015-08-13 MED ORDER — OXYCODONE HCL 5 MG PO TABS: 5.0000 mg | ORAL_TABLET | ORAL | Status: DC | PRN

## 2015-08-13 MED ORDER — ASPIRIN EC 81 MG PO TBEC
81.0000 mg | DELAYED_RELEASE_TABLET | Freq: Every evening | ORAL | Status: DC
  Administered 2015-08-13 – 2015-08-17 (×5): 81 mg via ORAL
  Filled 2015-08-13 (×5): qty 1

## 2015-08-13 MED ORDER — SODIUM CHLORIDE 0.9 % IV SOLN
1000.0000 mL | INTRAVENOUS | Status: DC
  Administered 2015-08-13: 1000 mL via INTRAVENOUS

## 2015-08-13 NOTE — ED Notes (Signed)
Pt changed of incontinent bm x 2

## 2015-08-13 NOTE — Plan of Care (Signed)
Problem: Physical Regulation: Goal: Will remain free from infection Outcome: Progressing Patient with positive PCR MRSA, and rule out c-diff.

## 2015-08-13 NOTE — ED Notes (Signed)
md aware of lactacte

## 2015-08-13 NOTE — Progress Notes (Signed)
[facility] PROGRESS NOTE    Progress Note   SONDI GRIBBON [id number] DOB: 1923/12/28 DOA: 08/12/2015 PCP: Thressa Sheller, MD   Brief Narrative:   Adrienne [name] is an 81 y.o. female past history of heart failure, COPD, hypertension and diabetes who is brought to the ED after a syncopal episode. The patient relates she has been having several episodes of diarrhea that started on day of admission accompanied by weakness.  Assessment/Plan:  Syncope due to have the child/Sepsis due to CAP (community acquired pneumonia) with acute respiratory failure with hypoxia: She continues to desaturate in the 70s when she had exerted herself, we'll continue nasal cannula oxygen. Takes are less than 5 seconds to come up to the 90s. She was started empirically on the sepsis pathway on Rocephin and azithromycin. She was adequately volume resuscitated she is +3 L her lactic acid is clear. Blood pressure has remained stable, patient is incontinent repeat a basic metabolic panel in the afternoon. Has remained afebrile. Her syncope was likely due to hypovolemic shock. Nonenhancing cardiac monitoring.  Acute kidney injury: In the setting of hypotension and ACE inhibitor use. She was started on IV fluid hydration her ACE inhibitor was stopped.  Acute diarrhea: C. difficile negative.  COPD (chronic obstructive pulmonary disease) ([place]) Continue Duonebs.  DM (diabetes mellitus) ([place]) Are no medications at home, her last known A1c was 6.1.  Chronic diastolic heart failure ([facility]) EF was 55% with a grade 2 diastolic heart failure on 10/02/2013.    DVT Prophylaxis - Lovenox ordered.  Family Communication: none Disposition Plan: Home 3-4 days Code Status:     Code Status Orders        Start     Ordered   08/13/15 0554  Full code   Continuous     08/13/15 0553        IV Access:    Peripheral IV   Procedures and diagnostic studies:   Dg Chest Portable 1  View  08/13/2015  CLINICAL DATA:  Hypotension.  Altered mental status. EXAM: PORTABLE CHEST 1 VIEW COMPARISON:  08/08/2014 FINDINGS: Shallow inspiration with elevation of right hemidiaphragm. Developing atelectasis or infiltration in the right lung base since previous study. Developing pneumonia is not excluded. Left lung appears expanded without focal consolidation demonstrated on the left. No blunting of costophrenic angles. No pneumothorax. Normal heart size and pulmonary vascularity for technique. IMPRESSION: Elevation of right hemidiaphragm with increasing atelectasis or infiltration in the right lung base since previous study. Electronically Signed   By: Lucienne Capers M.D.   On: 08/13/2015 00:55     Medical Consultants:    None.  Anti-Infectives:   Anti-infectives    Start     Dose/Rate Route Frequency Ordered Stop   08/13/15 2200  cefTRIAXone (ROCEPHIN) 1 g in dextrose 5 % 50 mL IVPB     1 g 100 mL/hr over 30 Minutes Intravenous Every 24 hours 08/13/15 0553     08/13/15 2200  azithromycin (ZITHROMAX) 500 mg in dextrose 5 % 250 mL IVPB     500 mg 250 mL/hr over 60 Minutes Intravenous Every 24 hours 08/13/15 0553     08/13/15 0215  cefTRIAXone (ROCEPHIN) 1 g in dextrose 5 % 50 mL IVPB     1 g 100 mL/hr over 30 Minutes Intravenous  Once 08/13/15 0204 08/13/15 0329   08/13/15 0215  azithromycin (ZITHROMAX) 500 mg in dextrose 5 % 250 mL IVPB     500 mg 250 mL/hr over 60 Minutes Intravenous  Once 08/13/15 0204 08/13/15 0426      Subjective:    Julien Girt she is hungry she relates she feels slightly better than yesterday. She relates her shortness of breath is improved.  Objective:    Filed Vitals:   08/13/15 0530 08/13/15 0545 08/13/15 0600 08/13/15 0700  BP: 129/64 134/66 150/70 151/80  Pulse: 77 76 76 73  Temp:      TempSrc:      Resp: 18 21 26 19   Height:      Weight:      SpO2: 97% 93% 95% 99%    Intake/Output Summary (Last 24 hours) at 08/13/15  0750 Last data filed at 08/13/15 0700  Gross per 24 hour  Intake 3253.33 ml  Output    150 ml  Net 3103.33 ml   Filed Weights   08/13/15 0005 08/13/15 0406  Weight: 86.183 kg (190 lb) 83.3 kg (183 lb 10.3 oz)    Exam: Gen:  NAD Cardiovascular:  RRR,- JVD Chest and lungs:   Good air movement and clear to auscultation. Abdomen:  Abdomen soft, NT/ND, + BS Extremities:  No C/E/C   Data Reviewed:    Labs: Basic Metabolic Panel:  Recent Labs Lab 08/13/15 0033  NA 137  K 4.0  CL 107  CO2 18*  GLUCOSE 176*  BUN 18  CREATININE 1.51*  CALCIUM 9.6   GFR Estimated Creatinine Clearance: 26.4 mL/min (by C-G formula based on Cr of 1.51). Liver Function Tests:  Recent Labs Lab 08/13/15 0033  AST 28  ALT 18  ALKPHOS 94  BILITOT 0.5  PROT 5.9*  ALBUMIN 3.0*   No results for input(s): LIPASE, AMYLASE in the last 168 hours. No results for input(s): AMMONIA in the last 168 hours. Coagulation profile No results for input(s): INR, PROTIME in the last 168 hours.  CBC:  Recent Labs Lab 08/13/15 0033  WBC 16.6*  HGB 14.3  HCT 45.2  MCV 96.0  PLT 186   Cardiac Enzymes:  Recent Labs Lab 08/13/15 0033  TROPONINI <0.03   BNP (last 3 results) No results for input(s): PROBNP in the last 8760 hours. CBG:  Recent Labs Lab 08/13/15 0116  GLUCAP 163*   D-Dimer: No results for input(s): DDIMER in the last 72 hours. Hgb A1c: No results for input(s): HGBA1C in the last 72 hours. Lipid Profile: No results for input(s): CHOL, HDL, LDLCALC, TRIG, CHOLHDL, LDLDIRECT in the last 72 hours. Thyroid function studies: No results for input(s): TSH, T4TOTAL, T3FREE, THYROIDAB in the last 72 hours.  Invalid input(s): FREET3 Anemia work up: No results for input(s): VITAMINB12, FOLATE, FERRITIN, TIBC, IRON, RETICCTPCT in the last 72 hours. Sepsis Labs:  Recent Labs Lab 08/13/15 0033 08/13/15 0059 08/13/15 0330 08/13/15 0645  WBC 16.6*  --   --   --   LATICACIDVEN   --  5.29* 3.40* 1.7   Microbiology Recent Results (from the past 240 hour(s))  MRSA PCR Screening     Status: Abnormal   Collection Time: 08/13/15  3:50 AM  Result Value Ref Range Status   MRSA by PCR POSITIVE (A) NEGATIVE Final    Comment:        The GeneXpert MRSA Assay (FDA approved for NASAL specimens only), is one component of a comprehensive MRSA colonization surveillance program. It is not intended to diagnose MRSA infection nor to guide or monitor treatment for MRSA infections. RESULT CALLED TO, READ BACK BY AND VERIFIED WITH: ROBERTS @0607  08/13/15 MKELLY   C difficile  quick scan w PCR reflex     Status: None   Collection Time: 08/13/15  6:07 AM  Result Value Ref Range Status   C Diff antigen NEGATIVE NEGATIVE Final   C Diff toxin NEGATIVE NEGATIVE Final   C Diff interpretation Negative for toxigenic C. difficile  Final     Medications:   . antiseptic oral rinse  7 mL Mouth Rinse BID  . aspirin EC  81 mg Oral QPM  . azithromycin  500 mg Intravenous Q24H  . benazepril  40 mg Oral Daily  . cefTRIAXone (ROCEPHIN)  IV  1 g Intravenous Q24H  . Chlorhexidine Gluconate Cloth  6 each Topical Q0600  . enoxaparin (LOVENOX) injection  30 mg Subcutaneous Daily  . ipratropium-albuterol  3 mL Nebulization Q4H  . metoprolol succinate  25 mg Oral Daily  . mupirocin ointment  1 application Nasal BID  . QUEtiapine  50-100 mg Oral BID  . sertraline  50 mg Oral Daily  . sodium chloride  500 mL Intravenous Once  . verapamil  120 mg Oral BID   Continuous Infusions: . sodium chloride Stopped (08/13/15 0554)  . sodium chloride 75 mL/hr at 08/13/15 0554    Time spent: 25 min   LOS: 0 days   Charlynne Cousins  [facility] Pager [phone]  *Please refer to [email or web address], password [email or web address] to get updated schedule on who will round on this patient, as hospitalists switch teams weekly. If 7PM-7AM, please contact night-coverage at [email or web address], password [email or web address] for any overnight  needs.  08/13/2015, 7:50 AM

## 2015-08-13 NOTE — H&P (Signed)
[facility] Admission History and Physical       Adrienne [name] [id number] DOB: 11-30-1923 DOA: 08/12/2015  Referring physician: EDP PCP: Thressa Sheller, MD  Specialists:   Chief Complaint: Passed Out  HPI: Adrienne [name] is a 81 y.o. female with a history of CHF, COPD, HTN, DM2 who was brought to the ED via EMS after a syncopal episode at home while using her toilet.    She reports having several episodes of  diarrhea today and having weakness and lethargy.  EMS found her to have hypotension with blood pressure of 87/49 and was administered 500 cc of IVFs on route.  In the ED she was found to have RLL Pneumonia and a Lactic Acidosis of 5.29 and she was placed on IV antibiotic coverage for CAP and referred for admission.        Review of Systems:  Constitutional: No Weight Loss, No Weight Gain, Night Sweats, Fevers, Chills, Dizziness, Light Headedness, Fatigue, +Malaise,  +Generalized Weakness HEENT: No Headaches, Difficulty Swallowing,Tooth/Dental Problems,Sore Throat,  No Sneezing, Rhinitis, Ear Ache, Nasal Congestion, or Post Nasal Drip,  Cardio-vascular:  No Chest pain, Orthopnea, PND, Edema in Lower Extremities, Anasarca, Dizziness, Palpitations  Resp: No Dyspnea, No DOE, No Productive Cough, No Non-Productive Cough, No Hemoptysis, No Wheezing.    GI: No Heartburn, Indigestion, Abdominal Pain, Nausea, Vomiting, +Diarrhea, Constipation, Hematemesis, Hematochezia, Melena, Change in Bowel Habits,  Loss of Appetite  GU: No Dysuria, No Change in Color of Urine, No Urgency or Urinary Frequency, No Flank pain.  Musculoskeletal: No Joint Pain or Swelling, No Decreased Range of Motion, No Back Pain.  Neurologic:  +Syncope, No Seizures, Muscle Weakness, Paresthesia, Vision Disturbance or Loss, No Diplopia, No Vertigo, No Difficulty Walking,  Skin: No Rash or Lesions. Psych: No Change in Mood or Affect, No Depression or Anxiety, No Memory loss, No Confusion, or  Hallucinations   Past Medical History  Diagnosis Date  . COPD (chronic obstructive pulmonary disease) ([place])   . Arthritis   . Cancer ([place])   . Breast cancer ([place])     right  . Hypertension   . AKI (acute kidney injury) ([place]) 10/01/2013  . Shortness of breath   . Diabetes mellitus without complication ([place])     TYPE 2     Past Surgical History  Procedure Laterality Date  . Laparoscopic hysterectomy    . Appendectomy    . Laparoscopic cholecystectomy    . Breast surgery Right lumpectomy      Prior to Admission medications   Medication Sig Start Date End Date Taking? Authorizing Provider  acetaminophen (TYLENOL) 500 MG tablet Take 1,000 mg by mouth every 6 (six) hours as needed for mild pain (back pain).    Yes Historical Provider, MD  albuterol (PROVENTIL HFA;VENTOLIN HFA) 108 (90 BASE) MCG/ACT inhaler Inhale 2 puffs into the lungs every 6 (six) hours as needed for wheezing or shortness of breath. 10/04/13  Yes Modena Jansky, MD  albuterol (PROVENTIL) (2.5 MG/3ML) 0.083% nebulizer solution Take 2.5 mg by nebulization 2 (two) times daily.    Yes Historical Provider, MD  aspirin EC 81 MG tablet Take 81 mg by mouth every evening.   Yes Historical Provider, MD  benazepril (LOTENSIN) 40 MG tablet Take 40 mg by mouth daily.   Yes Historical Provider, MD  diphenhydramine-acetaminophen (TYLENOL PM) 25-500 MG TABS Take 1.5 tablets by mouth at bedtime as needed (pain).    Yes Historical Provider, MD  Docusate Calcium (STOOL SOFTENER PO) Take 1  tablet by mouth at bedtime as needed (constipation).   Yes Historical Provider, MD  Ketotifen Fumarate (ALAWAY OP) Apply 1 drop to eye 2 (two) times daily as needed (itchy eyes). Both eyes For itchy eyes   Yes Historical Provider, MD  LORazepam (ATIVAN) 2 MG tablet Take 1 mg by mouth every 6 (six) hours as needed for anxiety.   Yes Historical Provider, MD  metoprolol succinate (TOPROL-XL) 50 MG 24 hr tablet Take 25 mg by mouth daily. Take with or  immediately following a meal.   Yes Historical Provider, MD  promethazine (PHENERGAN) 25 MG tablet Take 12.5 mg by mouth every 6 (six) hours as needed for nausea or vomiting (nausea).   Yes Historical Provider, MD  QUEtiapine (SEROQUEL) 100 MG tablet Take 50-100 mg by mouth 2 (two) times daily. Depending on level of agitation.   Yes Historical Provider, MD  sertraline (ZOLOFT) 50 MG tablet Take 50 mg by mouth daily.  10/07/13  Yes Historical Provider, MD  tiotropium (SPIRIVA HANDIHALER) 18 MCG inhalation capsule Place 1 capsule (18 mcg total) into inhaler and inhale daily. 10/04/13  Yes Modena Jansky, MD  verapamil (CALAN) 120 MG tablet Take 120 mg by mouth 2 (two) times daily.    Yes Historical Provider, MD     Allergies  Allergen Reactions  . Tetanus Toxoids Swelling  . Latex Rash  . Penicillins Rash    Social History:  reports that she quit smoking about 35 years ago. Her smoking use included Cigarettes. She has a 60 pack-year smoking history. She has never used smokeless tobacco. She reports that she does not drink alcohol or use illicit drugs.    Family History  Problem Relation Age of Onset  . Lung cancer Father   . Heart disease Brother        Physical Exam:  GEN:  Pleasant Elderly Obese 81 y.o. Caucasian female examined and in no acute distress; cooperative with exam Filed Vitals:   08/13/15 0330 08/13/15 0400 08/13/15 0406 08/13/15 0415  BP: 133/61 127/61 127/61 119/55  Pulse: 81 80 78 79  Temp:   97.5 F (36.4 C)   TempSrc:   Oral   Resp: 18 22 16 17   Height:   5\' 4"  (1.626 m)   Weight:   83.3 kg (183 lb 10.3 oz)   SpO2: 95% 99% 98% 99%   Blood pressure 119/55, pulse 79, temperature 97.5 F (36.4 C), temperature source Oral, resp. rate 17, height 5\' 4"  (1.626 m), weight 83.3 kg (183 lb 10.3 oz), SpO2 99 %. PSYCH: She is alert and oriented x4; does not appear anxious does not appear depressed; affect is normal HEENT: Normocephalic and Atraumatic, Mucous membranes  pink; PERRLA; EOM intact; Fundi:  Benign;  No scleral icterus, Nares: Patent, Oropharynx: Clear, Fair Dentition,    Neck:  FROM, No Cervical Lymphadenopathy nor Thyromegaly or Carotid Bruit; No JVD; Breasts:: Not examined CHEST WALL: No tenderness CHEST: Normal respiration, clear to auscultation bilaterally HEART: Regular rate and rhythm; no murmurs rubs or gallops BACK: No kyphosis or scoliosis; No CVA tenderness ABDOMEN: Positive Bowel Sounds, Obese, Soft Non-Tender, No Rebound or Guarding; No Masses, No Organomegaly. Rectal Exam: Not done EXTREMITIES: No Cyanosis, Clubbing, or Edema; No Ulcerations. Genitalia: not examined PULSES: 2+ and symmetric SKIN: Normal hydration no rash or ulceration CNS:  Alert and Oriented x 4, No Focal Deficits Vascular: pulses palpable throughout    Labs on Admission:  Basic Metabolic Panel:  Recent Labs Lab 08/13/15 0033  NA 137  K 4.0  CL 107  CO2 18*  GLUCOSE 176*  BUN 18  CREATININE 1.51*  CALCIUM 9.6   Liver Function Tests:  Recent Labs Lab 08/13/15 0033  AST 28  ALT 18  ALKPHOS 94  BILITOT 0.5  PROT 5.9*  ALBUMIN 3.0*   No results for input(s): LIPASE, AMYLASE in the last 168 hours. No results for input(s): AMMONIA in the last 168 hours. CBC:  Recent Labs Lab 08/13/15 0033  WBC 16.6*  HGB 14.3  HCT 45.2  MCV 96.0  PLT 186   Cardiac Enzymes:  Recent Labs Lab 08/13/15 0033  TROPONINI <0.03    BNP (last 3 results) No results for input(s): BNP in the last 8760 hours.  ProBNP (last 3 results) No results for input(s): PROBNP in the last 8760 hours.  CBG:  Recent Labs Lab 08/13/15 0116  GLUCAP 163*    Radiological Exams on Admission: Dg Chest Portable 1 View  08/13/2015  CLINICAL DATA:  Hypotension.  Altered mental status. EXAM: PORTABLE CHEST 1 VIEW COMPARISON:  08/08/2014 FINDINGS: Shallow inspiration with elevation of right hemidiaphragm. Developing atelectasis or infiltration in the right lung base  since previous study. Developing pneumonia is not excluded. Left lung appears expanded without focal consolidation demonstrated on the left. No blunting of costophrenic angles. No pneumothorax. Normal heart size and pulmonary vascularity for technique. IMPRESSION: Elevation of right hemidiaphragm with increasing atelectasis or infiltration in the right lung base since previous study. Electronically Signed   By: Lucienne Capers M.D.   On: 08/13/2015 00:55     EKG: Independently reviewed. Normal Sinus Rhyhm rate =79    Assessment/Plan:    81 y.o. female with  Principal Problem:   1.    CAP (community acquired pneumonia)   IV Rocephin and Azithromycin   NCO2 PRN   Active Problems:   2.    Sepsis ([place])   IV Abx   IVFs      3.     Syncope- most likely due to Hypotension   Cardiac Monitoring   IVFs    4.    Acute diarrhea   Enteric Precautions   C.Diff and Stool Cx sent     5.    Hypotension   IVFs   Hold BP meds     6.    COPD (chronic obstructive pulmonary disease) (HCC)   Duonebs     7    Chronic diastolic heart failure (HCC)   Monitor I/Os     8.    DM (diabetes mellitus) (HCC)   SSI coverage PRN     9.    DVT Prophylaxis   Lovenox     Code Status:     FULL CODE       Family Communication:   No Family Present    Disposition Plan:    Inpatient Status        Time spent:  62 Minutes      Theressa Millard [facility] Pager [phone]   If [place] Please Contact the Day Rounding Team MD for [facility]  If 7PM-7AM, Please Contact Night-Floor Coverage  [email or web address] Password [email or web address] 08/13/2015, 5:28 AM     ADDENDUM:   Patient was seen and examined on 08/13/2015

## 2015-08-13 NOTE — Progress Notes (Signed)
Positive PCR nasal MRSA. Order set initiated, patient placed on contact isolation.

## 2015-08-13 NOTE — ED Notes (Signed)
Pt here for abd pain and ams, per ems hypotensive 80/40. Pt changed of bowel incontinence on arrival. Pt does have alzheimers and uses a walker typically to get around now just weak and lethargic. Pt answering questions appropriately generalized weakness noted, pt given 500 ml of ns enroute.

## 2015-08-14 DIAGNOSIS — J9621 Acute and chronic respiratory failure with hypoxia: Secondary | ICD-10-CM

## 2015-08-14 DIAGNOSIS — A419 Sepsis, unspecified organism: Principal | ICD-10-CM

## 2015-08-14 LAB — URINE CULTURE: Culture: NO GROWTH

## 2015-08-14 LAB — BASIC METABOLIC PANEL
ANION GAP: 7 (ref 5–15)
BUN: 20 mg/dL (ref 6–20)
CALCIUM: 8.9 mg/dL (ref 8.9–10.3)
CO2: 23 mmol/L (ref 22–32)
Chloride: 111 mmol/L (ref 101–111)
Creatinine, Ser: 1.01 mg/dL — ABNORMAL HIGH (ref 0.44–1.00)
GFR calc Af Amer: 55 mL/min — ABNORMAL LOW (ref >60)
GFR, EST NON AFRICAN AMERICAN: 48 mL/min — AB (ref >60)
GLUCOSE: 112 mg/dL — AB (ref 65–99)
Potassium: 4 mmol/L (ref 3.5–5.1)
Sodium: 141 mmol/L (ref 135–145)

## 2015-08-14 LAB — CBC
HCT: 34.8 % — ABNORMAL LOW (ref 36.0–46.0)
HEMOGLOBIN: 11.1 g/dL — AB (ref 12.0–15.0)
MCH: 30.2 pg (ref 26.0–34.0)
MCHC: 31.9 g/dL (ref 30.0–36.0)
MCV: 94.8 fL (ref 78.0–100.0)
Platelets: 161 10*3/uL (ref 150–400)
RBC: 3.67 MIL/uL — ABNORMAL LOW (ref 3.87–5.11)
RDW: 16 % — AB (ref 11.5–15.5)
WBC: 10 10*3/uL (ref 4.0–10.5)

## 2015-08-14 MED ORDER — QUETIAPINE FUMARATE 25 MG PO TABS
50.0000 mg | ORAL_TABLET | Freq: Two times a day (BID) | ORAL | Status: DC
  Administered 2015-08-14 – 2015-08-18 (×8): 50 mg via ORAL
  Filled 2015-08-14 (×8): qty 2

## 2015-08-14 MED ORDER — ENOXAPARIN SODIUM 40 MG/0.4ML ~~LOC~~ SOLN
40.0000 mg | Freq: Every day | SUBCUTANEOUS | Status: DC
  Administered 2015-08-15 – 2015-08-18 (×4): 40 mg via SUBCUTANEOUS
  Filled 2015-08-14 (×4): qty 0.4

## 2015-08-14 MED ORDER — ISOSORBIDE MONONITRATE ER 30 MG PO TB24
30.0000 mg | ORAL_TABLET | Freq: Every day | ORAL | Status: DC
  Administered 2015-08-14 – 2015-08-18 (×5): 30 mg via ORAL
  Filled 2015-08-14 (×5): qty 1

## 2015-08-14 NOTE — Progress Notes (Signed)
[facility] PROGRESS NOTE    Progress Note   MAHNIYA SABAS [id number] DOB: May 05, 1924 DOA: 08/12/2015 PCP: Thressa Sheller, MD   Brief Narrative:   Adrienne [name] is an 81 y.o. female past history of heart failure, COPD, hypertension and diabetes who is brought to the ED after a syncopal episode. The patient relates she has been having several episodes of diarrhea that started on day of admission accompanied by weakness.  Assessment/Plan:  Syncope due to have the child/Sepsis due to CAP (community acquired pneumonia) with acute respiratory failure with hypoxia: Her syncope was likely due to hypovolemic shock.  Hypoxia is improved today. Has remained a febrile. She was started empirically on the sepsis pathway on Rocephin and azithromycin. Blood pressure high resume antihypertensive medications. Has remained afebrile. Transfer to regular floor.  Acute kidney injury: In the setting of hypotension and ACE inhibitor use. Resolved with IV hydration. Resume ACE-i.  Acute diarrhea: C. difficile negative. resolved  COPD (chronic obstructive pulmonary disease) ([place]) Continue Duonebs.  DM (diabetes mellitus) ([place]) On no medications at home, her last known A1c was 6.1.  Chronic diastolic heart failure (HCC) EF was 55% with a grade 2 diastolic heart failure on 10/02/2013. KVO iV fluids seem to be euvolemic.    DVT Prophylaxis - Lovenox ordered.  Family Communication: none Disposition Plan: Home 2-3days Code Status:     Code Status Orders        Start     Ordered   08/13/15 0554  Full code   Continuous     08/13/15 0553        IV Access:    Peripheral IV   Procedures and diagnostic studies:   Dg Chest Portable 1 View  08/13/2015  CLINICAL DATA:  Hypotension.  Altered mental status. EXAM: PORTABLE CHEST 1 VIEW COMPARISON:  08/08/2014 FINDINGS: Shallow inspiration with elevation of right hemidiaphragm. Developing atelectasis or infiltration  in the right lung base since previous study. Developing pneumonia is not excluded. Left lung appears expanded without focal consolidation demonstrated on the left. No blunting of costophrenic angles. No pneumothorax. Normal heart size and pulmonary vascularity for technique. IMPRESSION: Elevation of right hemidiaphragm with increasing atelectasis or infiltration in the right lung base since previous study. Electronically Signed   By: Lucienne Capers M.D.   On: 08/13/2015 00:55     Medical Consultants:    None.  Anti-Infectives:   Anti-infectives    Start     Dose/Rate Route Frequency Ordered Stop   08/14/15 0500  azithromycin (ZITHROMAX) 500 mg in dextrose 5 % 250 mL IVPB     500 mg 250 mL/hr over 60 Minutes Intravenous Every 24 hours 08/13/15 1030     08/14/15 0500  cefTRIAXone (ROCEPHIN) 1 g in dextrose 5 % 50 mL IVPB     1 g 100 mL/hr over 30 Minutes Intravenous Every 24 hours 08/13/15 1030     08/13/15 2200  cefTRIAXone (ROCEPHIN) 1 g in dextrose 5 % 50 mL IVPB  Status:  Discontinued     1 g 100 mL/hr over 30 Minutes Intravenous Every 24 hours 08/13/15 0553 08/13/15 1030   08/13/15 2200  azithromycin (ZITHROMAX) 500 mg in dextrose 5 % 250 mL IVPB  Status:  Discontinued     500 mg 250 mL/hr over 60 Minutes Intravenous Every 24 hours 08/13/15 0553 08/13/15 1030   08/13/15 0215  cefTRIAXone (ROCEPHIN) 1 g in dextrose 5 % 50 mL IVPB     1 g 100 mL/hr over 30  Minutes Intravenous  Once 08/13/15 0204 08/13/15 0329   08/13/15 0215  azithromycin (ZITHROMAX) 500 mg in dextrose 5 % 250 mL IVPB     500 mg 250 mL/hr over 60 Minutes Intravenous  Once 08/13/15 0204 08/13/15 0426      Subjective:    Julien Girt speaking in full sentence with oxygen and not SOB.  Objective:    Filed Vitals:   08/14/15 0500 08/14/15 0600 08/14/15 0700 08/14/15 0726  BP:  152/84  166/68  Pulse: 73 78 71   Temp:    96.7 F (35.9 C)  TempSrc:    Axillary  Resp: 18 21 14    Height:        Weight:      SpO2: 91% 96% 93% 96%    Intake/Output Summary (Last 24 hours) at 08/14/15 0828 Last data filed at 08/14/15 0700  Gross per 24 hour  Intake   1030 ml  Output    525 ml  Net    505 ml   Filed Weights   08/13/15 0005 08/13/15 0406 08/14/15 0300  Weight: 86.183 kg (190 lb) 83.3 kg (183 lb 10.3 oz) 84.4 kg (186 lb 1.1 oz)    Exam: Gen:  NAD Cardiovascular:  RRR,- JVD Chest and lungs:   Good air movement and clear to auscultation. Abdomen:  Abdomen soft, NT/ND, + BS Extremities:  No C/E/C   Data Reviewed:    Labs: Basic Metabolic Panel:  Recent Labs Lab 08/13/15 0033 08/13/15 1530 08/14/15 0215  NA 137 137 141  K 4.0 3.7 4.0  CL 107 109 111  CO2 18* 23 23  GLUCOSE 176* 128* 112*  BUN 18 22* 20  CREATININE 1.51* 1.14* 1.01*  CALCIUM 9.6 8.9 8.9   GFR Estimated Creatinine Clearance: 39.7 mL/min (by C-G formula based on Cr of 1.01). Liver Function Tests:  Recent Labs Lab 08/13/15 0033  AST 28  ALT 18  ALKPHOS 94  BILITOT 0.5  PROT 5.9*  ALBUMIN 3.0*   No results for input(s): LIPASE, AMYLASE in the last 168 hours. No results for input(s): AMMONIA in the last 168 hours. Coagulation profile No results for input(s): INR, PROTIME in the last 168 hours.  CBC:  Recent Labs Lab 08/13/15 0033 08/14/15 0215  WBC 16.6* 10.0  HGB 14.3 11.1*  HCT 45.2 34.8*  MCV 96.0 94.8  PLT 186 161   Cardiac Enzymes:  Recent Labs Lab 08/13/15 0033  TROPONINI <0.03   BNP (last 3 results) No results for input(s): PROBNP in the last 8760 hours. CBG:  Recent Labs Lab 08/13/15 0116 08/13/15 0827  GLUCAP 163* 89   D-Dimer: No results for input(s): DDIMER in the last 72 hours. Hgb A1c: No results for input(s): HGBA1C in the last 72 hours. Lipid Profile: No results for input(s): CHOL, HDL, LDLCALC, TRIG, CHOLHDL, LDLDIRECT in the last 72 hours. Thyroid function studies: No results for input(s): TSH, T4TOTAL, T3FREE, THYROIDAB in the last 72  hours.  Invalid input(s): FREET3 Anemia work up: No results for input(s): VITAMINB12, FOLATE, FERRITIN, TIBC, IRON, RETICCTPCT in the last 72 hours. Sepsis Labs:  Recent Labs Lab 08/13/15 0033 08/13/15 0059 08/13/15 0330 08/13/15 0645 08/13/15 0955 08/14/15 0215  PROCALCITON  --   --   --  <0.10  --   --   WBC 16.6*  --   --   --   --  10.0  LATICACIDVEN  --  5.29* 3.40* 1.7 1.2  --    Microbiology Recent  Results (from the past 240 hour(s))  MRSA PCR Screening     Status: Abnormal   Collection Time: 08/13/15  3:50 AM  Result Value Ref Range Status   MRSA by PCR POSITIVE (A) NEGATIVE Final    Comment:        The GeneXpert MRSA Assay (FDA approved for NASAL specimens only), is one component of a comprehensive MRSA colonization surveillance program. It is not intended to diagnose MRSA infection nor to guide or monitor treatment for MRSA infections. RESULT CALLED TO, READ BACK BY AND VERIFIED WITH: ROBERTS @0607  08/13/15 MKELLY   C difficile quick scan w PCR reflex     Status: None   Collection Time: 08/13/15  6:07 AM  Result Value Ref Range Status   C Diff antigen NEGATIVE NEGATIVE Final   C Diff toxin NEGATIVE NEGATIVE Final   C Diff interpretation Negative for toxigenic C. difficile  Final     Medications:   . antiseptic oral rinse  7 mL Mouth Rinse BID  . aspirin EC  81 mg Oral QPM  . azithromycin  500 mg Intravenous Q24H  . benazepril  40 mg Oral Daily  . cefTRIAXone (ROCEPHIN)  IV  1 g Intravenous Q24H  . Chlorhexidine Gluconate Cloth  6 each Topical Q0600  . enoxaparin (LOVENOX) injection  30 mg Subcutaneous Daily  . ipratropium-albuterol  3 mL Nebulization TID  . metoprolol succinate  25 mg Oral Daily  . mupirocin ointment  1 application Nasal BID  . QUEtiapine  50-100 mg Oral BID  . sertraline  50 mg Oral Daily  . verapamil  120 mg Oral BID   Continuous Infusions: . sodium chloride 10 mL/hr at 08/13/15 0800    Time spent: 25 min   LOS: 1 day     Charlynne Cousins  [facility] Pager [phone]  *Please refer to [email or web address], password [email or web address] to get updated schedule on who will round on this patient, as hospitalists switch teams weekly. If 7PM-7AM, please contact night-coverage at [email or web address], password [email or web address] for any overnight needs.  08/14/2015, 8:28 AM

## 2015-08-14 NOTE — Progress Notes (Addendum)
Report called to [employer]. Patient currently eating lunch and will be transferred when she has finished eating her lunch. Daughter Adrienne [name] made aware of transfer.

## 2015-08-15 LAB — CBC
HEMATOCRIT: 35.1 % — AB (ref 36.0–46.0)
HEMOGLOBIN: 11 g/dL — AB (ref 12.0–15.0)
MCH: 30.2 pg (ref 26.0–34.0)
MCHC: 31.3 g/dL (ref 30.0–36.0)
MCV: 96.4 fL (ref 78.0–100.0)
Platelets: 144 10*3/uL — ABNORMAL LOW (ref 150–400)
RBC: 3.64 MIL/uL — ABNORMAL LOW (ref 3.87–5.11)
RDW: 16.2 % — ABNORMAL HIGH (ref 11.5–15.5)
WBC: 9 10*3/uL (ref 4.0–10.5)

## 2015-08-15 LAB — BASIC METABOLIC PANEL
ANION GAP: 7 (ref 5–15)
BUN: 12 mg/dL (ref 6–20)
CHLORIDE: 110 mmol/L (ref 101–111)
CO2: 23 mmol/L (ref 22–32)
Calcium: 9.2 mg/dL (ref 8.9–10.3)
Creatinine, Ser: 0.86 mg/dL (ref 0.44–1.00)
GFR calc Af Amer: >60 mL/min (ref >60)
GFR, EST NON AFRICAN AMERICAN: 58 mL/min — AB (ref >60)
GLUCOSE: 121 mg/dL — AB (ref 65–99)
POTASSIUM: 3.9 mmol/L (ref 3.5–5.1)
Sodium: 140 mmol/L (ref 135–145)

## 2015-08-15 MED ORDER — LEVOFLOXACIN 500 MG PO TABS
500.0000 mg | ORAL_TABLET | Freq: Every day | ORAL | Status: DC
  Administered 2015-08-15 – 2015-08-18 (×4): 500 mg via ORAL
  Filled 2015-08-15 (×4): qty 1

## 2015-08-15 MED ORDER — TIOTROPIUM BROMIDE MONOHYDRATE 18 MCG IN CAPS
18.0000 ug | ORAL_CAPSULE | Freq: Every day | RESPIRATORY_TRACT | Status: DC
  Administered 2015-08-16 – 2015-08-17 (×2): 18 ug via RESPIRATORY_TRACT
  Filled 2015-08-15: qty 5

## 2015-08-15 NOTE — Care Management Note (Addendum)
Case Management Note  Patient Details  Name: STARLET BERGFELD MRN: [id number] Date of Birth: Aug 05, 1924  Subjective/Objective:     Sepsis due to CAP                Action/Plan: NCM spoke to pt and agreed to participate in PNA Program with [facility] for telephonic follow up post dc. Provided pt with brochure to explain program. Pt lives at home with dtr and in home 24 hours. She has RW and 3n1 bedside commode at home. States she can afford her medications.   PCP Dr Thressa Sheller  Expected Discharge Date:                  Expected Discharge Plan:  Home/Self Care  In-House Referral:     Discharge planning Services  CM Consult   Status of Service:  Completed, signed off  Medicare Important Message Given:    Date Medicare IM Given:    Medicare IM give by:    Date Additional Medicare IM Given:    Additional Medicare Important Message give by:     If discussed at [place] of Stay Meetings, dates discussed:    Additional Comments:  Erenest Rasher, RN 08/15/2015, 4:29 PM

## 2015-08-15 NOTE — Progress Notes (Signed)
[facility] PROGRESS NOTE    Progress Note   SHU LACOCK [id number] DOB: [birth year] DOA: 08/12/2015 PCP: Thressa Sheller, MD   Brief Narrative:   Adrienne [name] is an 81 y.o. female past history of heart failure, COPD, hypertension and diabetes who is brought to the ED after a syncopal episode. The patient relates she has been having several episodes of diarrhea that started on day of admission accompanied by weakness.  Assessment/Plan:  Syncope due to have the child/Sepsis due to CAP (community acquired pneumonia) with acute respiratory failure with hypoxia: Her syncope was likely due to hypovolemic shock.  Hypoxia is improved today. Has remained a febrile. Deescalated antibiotic regimen to Levaquin.  Acute kidney injury: In the setting of hypotension and ACE inhibitor use. Resolved with IV hydration. Resume ACE-i.  Acute diarrhea: C. difficile negative. received 2 doses of loperamide now resolved.   COPD (chronic obstructive pulmonary disease) ([place]) Continue Duonebs.  DM (diabetes mellitus) ([place]) On no medications at home, her last known A1c was 6.1.  Chronic diastolic heart failure (HCC) EF was 55% with a grade 2 diastolic heart failure on 10/02/2013. KVO iV fluids seem to be euvolemic.    DVT Prophylaxis - Lovenox ordered.  Family Communication: none Disposition Plan: Home in am Code Status:     Code Status Orders        Start     Ordered   08/13/15 0554  Full code   Continuous     08/13/15 0553        IV Access:    Peripheral IV   Procedures and diagnostic studies:   No results found.   Medical Consultants:    None.  Anti-Infectives:   Anti-infectives    Start     Dose/Rate Route Frequency Ordered Stop   08/14/15 0500  azithromycin (ZITHROMAX) 500 mg in dextrose 5 % 250 mL IVPB     500 mg 250 mL/hr over 60 Minutes Intravenous Every 24 hours 08/13/15 1030     08/14/15 0500  cefTRIAXone (ROCEPHIN) 1 g in  dextrose 5 % 50 mL IVPB     1 g 100 mL/hr over 30 Minutes Intravenous Every 24 hours 08/13/15 1030     08/13/15 2200  cefTRIAXone (ROCEPHIN) 1 g in dextrose 5 % 50 mL IVPB  Status:  Discontinued     1 g 100 mL/hr over 30 Minutes Intravenous Every 24 hours 08/13/15 0553 08/13/15 1030   08/13/15 2200  azithromycin (ZITHROMAX) 500 mg in dextrose 5 % 250 mL IVPB  Status:  Discontinued     500 mg 250 mL/hr over 60 Minutes Intravenous Every 24 hours 08/13/15 0553 08/13/15 1030   08/13/15 0215  cefTRIAXone (ROCEPHIN) 1 g in dextrose 5 % 50 mL IVPB     1 g 100 mL/hr over 30 Minutes Intravenous  Once 08/13/15 0204 08/13/15 0329   08/13/15 0215  azithromycin (ZITHROMAX) 500 mg in dextrose 5 % 250 mL IVPB     500 mg 250 mL/hr over 60 Minutes Intravenous  Once 08/13/15 0204 08/13/15 0426      Subjective:    Julien Girt to speak full sentences without oxygen, she relates her shortness of breath has resolved. He has no new complaints.  Objective:    Filed Vitals:   08/15/15 0346 08/15/15 0417 08/15/15 0734 08/15/15 1021  BP: 131/96   139/68  Pulse: 91   80  Temp: 98.2 F (36.8 C)   98.6 F (37 C)  TempSrc: Oral  Oral  Resp: 20   18  Height:      Weight:      SpO2: 90% 93% 97% 92%    Intake/Output Summary (Last 24 hours) at 08/15/15 1208 Last data filed at 08/15/15 1104  Gross per 24 hour  Intake    660 ml  Output   1250 ml  Net   -590 ml   Filed Weights   08/13/15 0005 08/13/15 0406 08/14/15 0300  Weight: 86.183 kg (190 lb) 83.3 kg (183 lb 10.3 oz) 84.4 kg (186 lb 1.1 oz)    Exam: Gen:  NAD Cardiovascular:  RRR,- JVD Chest and lungs:   Good air movement and clear to auscultation. Abdomen:  Abdomen soft, NT/ND, + BS Extremities:  No C/E/C   Data Reviewed:    Labs: Basic Metabolic Panel:  Recent Labs Lab 08/13/15 0033 08/13/15 1530 08/14/15 0215 08/15/15 0555  NA 137 137 141 140  K 4.0 3.7 4.0 3.9  CL 107 109 111 110  CO2 18* 23 23 23   GLUCOSE 176*  128* 112* 121*  BUN 18 22* 20 12  CREATININE 1.51* 1.14* 1.01* 0.86  CALCIUM 9.6 8.9 8.9 9.2   GFR Estimated Creatinine Clearance: 46.6 mL/min (by C-G formula based on Cr of 0.86). Liver Function Tests:  Recent Labs Lab 08/13/15 0033  AST 28  ALT 18  ALKPHOS 94  BILITOT 0.5  PROT 5.9*  ALBUMIN 3.0*   No results for input(s): LIPASE, AMYLASE in the last 168 hours. No results for input(s): AMMONIA in the last 168 hours. Coagulation profile No results for input(s): INR, PROTIME in the last 168 hours.  CBC:  Recent Labs Lab 08/13/15 0033 08/14/15 0215 08/15/15 0555  WBC 16.6* 10.0 9.0  HGB 14.3 11.1* 11.0*  HCT 45.2 34.8* 35.1*  MCV 96.0 94.8 96.4  PLT 186 161 144*   Cardiac Enzymes:  Recent Labs Lab 08/13/15 0033  TROPONINI <0.03   BNP (last 3 results) No results for input(s): PROBNP in the last 8760 hours. CBG:  Recent Labs Lab 08/13/15 0116 08/13/15 0827  GLUCAP 163* 89   D-Dimer: No results for input(s): DDIMER in the last 72 hours. Hgb A1c: No results for input(s): HGBA1C in the last 72 hours. Lipid Profile: No results for input(s): CHOL, HDL, LDLCALC, TRIG, CHOLHDL, LDLDIRECT in the last 72 hours. Thyroid function studies: No results for input(s): TSH, T4TOTAL, T3FREE, THYROIDAB in the last 72 hours.  Invalid input(s): FREET3 Anemia work up: No results for input(s): VITAMINB12, FOLATE, FERRITIN, TIBC, IRON, RETICCTPCT in the last 72 hours. Sepsis Labs:  Recent Labs Lab 08/13/15 0033 08/13/15 0059 08/13/15 0330 08/13/15 0645 08/13/15 0955 08/14/15 0215 08/15/15 0555  PROCALCITON  --   --   --  <0.10  --   --   --   WBC 16.6*  --   --   --   --  10.0 9.0  LATICACIDVEN  --  5.29* 3.40* 1.7 1.2  --   --    Microbiology Recent Results (from the past 240 hour(s))  Urine culture     Status: None   Collection Time: 08/13/15 12:10 AM  Result Value Ref Range Status   Specimen Description URINE, RANDOM  Final   Special Requests NONE  Final    Culture NO GROWTH 1 DAY  Final   Report Status 08/14/2015 FINAL  Final  Blood culture (routine x 2)     Status: None (Preliminary result)   Collection Time: 08/13/15 12:24 AM  Result Value Ref Range Status   Specimen Description BLOOD RIGHT HAND  Final   Special Requests BOTTLES DRAWN AEROBIC ONLY 2CC  Final   Culture NO GROWTH 1 DAY  Final   Report Status PENDING  Incomplete  Blood culture (routine x 2)     Status: None (Preliminary result)   Collection Time: 08/13/15 12:32 AM  Result Value Ref Range Status   Specimen Description BLOOD LEFT HAND  Final   Special Requests BOTTLES DRAWN AEROBIC ONLY 1CC  Final   Culture NO GROWTH 1 DAY  Final   Report Status PENDING  Incomplete  MRSA PCR Screening     Status: Abnormal   Collection Time: 08/13/15  3:50 AM  Result Value Ref Range Status   MRSA by PCR POSITIVE (A) NEGATIVE Final    Comment:        The GeneXpert MRSA Assay (FDA approved for NASAL specimens only), is one component of a comprehensive MRSA colonization surveillance program. It is not intended to diagnose MRSA infection nor to guide or monitor treatment for MRSA infections. RESULT CALLED TO, READ BACK BY AND VERIFIED WITH: ROBERTS @0607  08/13/15 MKELLY   C difficile quick scan w PCR reflex     Status: None   Collection Time: 08/13/15  6:07 AM  Result Value Ref Range Status   C Diff antigen NEGATIVE NEGATIVE Final   C Diff toxin NEGATIVE NEGATIVE Final   C Diff interpretation Negative for toxigenic C. difficile  Final     Medications:   . antiseptic oral rinse  7 mL Mouth Rinse BID  . aspirin EC  81 mg Oral QPM  . azithromycin  500 mg Intravenous Q24H  . benazepril  40 mg Oral Daily  . cefTRIAXone (ROCEPHIN)  IV  1 g Intravenous Q24H  . Chlorhexidine Gluconate Cloth  6 each Topical Q0600  . enoxaparin (LOVENOX) injection  40 mg Subcutaneous Daily  . ipratropium-albuterol  3 mL Nebulization TID  . isosorbide mononitrate  30 mg Oral Daily  . metoprolol  succinate  25 mg Oral Daily  . mupirocin ointment  1 application Nasal BID  . QUEtiapine  50 mg Oral BID  . sertraline  50 mg Oral Daily  . verapamil  120 mg Oral BID   Continuous Infusions: . sodium chloride 10 mL/hr at 08/13/15 0800    Time spent: 15 min   LOS: 2 days   Charlynne Cousins  [facility] Pager [phone]  *Please refer to [email or web address], password [email or web address] to get updated schedule on who will round on this patient, as hospitalists switch teams weekly. If 7PM-7AM, please contact night-coverage at [email or web address], password [email or web address] for any overnight needs.  08/15/2015, 12:08 PM

## 2015-08-16 LAB — BASIC METABOLIC PANEL
ANION GAP: 6 (ref 5–15)
BUN: 8 mg/dL (ref 6–20)
CO2: 26 mmol/L (ref 22–32)
Calcium: 9.3 mg/dL (ref 8.9–10.3)
Chloride: 107 mmol/L (ref 101–111)
Creatinine, Ser: 0.7 mg/dL (ref 0.44–1.00)
GLUCOSE: 95 mg/dL (ref 65–99)
POTASSIUM: 4 mmol/L (ref 3.5–5.1)
Sodium: 139 mmol/L (ref 135–145)

## 2015-08-16 LAB — CBC
HEMATOCRIT: 34.9 % — AB (ref 36.0–46.0)
HEMOGLOBIN: 10.8 g/dL — AB (ref 12.0–15.0)
MCH: 29.5 pg (ref 26.0–34.0)
MCHC: 30.9 g/dL (ref 30.0–36.0)
MCV: 95.4 fL (ref 78.0–100.0)
Platelets: 159 10*3/uL (ref 150–400)
RBC: 3.66 MIL/uL — AB (ref 3.87–5.11)
RDW: 16 % — ABNORMAL HIGH (ref 11.5–15.5)
WBC: 9.2 10*3/uL (ref 4.0–10.5)

## 2015-08-16 MED ORDER — VERAPAMIL HCL 40 MG PO TABS
40.0000 mg | ORAL_TABLET | Freq: Two times a day (BID) | ORAL | Status: DC
  Administered 2015-08-17 – 2015-08-18 (×2): 40 mg via ORAL
  Filled 2015-08-16 (×6): qty 1

## 2015-08-16 MED ORDER — METOPROLOL SUCCINATE ER 25 MG PO TB24
12.5000 mg | ORAL_TABLET | Freq: Every day | ORAL | Status: DC
  Administered 2015-08-17 – 2015-08-18 (×2): 12.5 mg via ORAL
  Filled 2015-08-16 (×3): qty 1

## 2015-08-16 MED ORDER — BENAZEPRIL HCL 5 MG PO TABS
5.0000 mg | ORAL_TABLET | Freq: Every day | ORAL | Status: DC
  Administered 2015-08-17 – 2015-08-18 (×2): 5 mg via ORAL
  Filled 2015-08-16 (×2): qty 1

## 2015-08-16 MED ORDER — SODIUM CHLORIDE 0.9 % IV BOLUS (SEPSIS)
500.0000 mL | Freq: Once | INTRAVENOUS | Status: AC
  Administered 2015-08-16: 500 mL via INTRAVENOUS

## 2015-08-16 MED ORDER — ISOSORBIDE MONONITRATE ER 30 MG PO TB24: 30.0000 mg | ORAL_TABLET | Freq: Every day | ORAL | Status: AC

## 2015-08-16 MED ORDER — LEVOFLOXACIN 500 MG PO TABS: 500.0000 mg | ORAL_TABLET | Freq: Every day | ORAL | Status: DC

## 2015-08-16 NOTE — Progress Notes (Signed)
Nursing student, Lanny Hurst, attempted to ambulate patient in the hallway.  Pt refused and stated "I am too tired to walk right now."  Pt placed back into bed with bed alarm activated.  Will attempt to walk patient again later today.

## 2015-08-16 NOTE — Progress Notes (Signed)
BP low, will give bolus and change BP meds around. Social work consult for SNF-- family prefers [facility] Eulogio Bear DO

## 2015-08-16 NOTE — Progress Notes (Signed)
Was told by NT that patient was complaining of "feeling bad".  When entering room, saw patient sitting in chair leaning over the bedside table.  Pt denied any chest pain, nausea or palpitations.  Checked vital signs: BP 78/43, HR 67, Temp 98.2, Respirations 14, oxygen saturations 95% on room air.  Moved patient to bed from chair.  Informed Dr. Eliseo Squires.  Will check manual and await orders for fluid bolus.  Dr. Eliseo Squires to adjust BP medications and requested that blood pressure medications be held tonight.  Will continue to monitor.

## 2015-08-16 NOTE — Progress Notes (Signed)
[facility] PROGRESS NOTE    Progress Note   YASUKO POZO [id number] DOB: 11-30-1923 DOA: 08/12/2015 PCP: Thressa Sheller, MD   Brief Narrative:   Adrienne [name] is an 81 y.o. female past history of heart failure, COPD, hypertension and diabetes who is brought to the ED after a syncopal episode. The patient relates she has been having several episodes of diarrhea that started on day of admission accompanied by weakness.  Assessment/Plan:  Syncope due to have the child/Sepsis due to CAP (community acquired pneumonia) with acute respiratory failure with hypoxia: Her syncope was likely due to hypovolemic shock.  Hypoxia is improved today. Has remained a febrile. Deescalated antibiotic regimen to Levaquin for total of 8 days  Acute kidney injury: In the setting of hypotension and ACE inhibitor use. Resolved with IV hydration. Resume ACE-i.  Acute diarrhea: C. difficile negative. received 2 doses of loperamide now resolved.   COPD (chronic obstructive pulmonary disease) ([place]) Continue Duonebs.  DM (diabetes mellitus) ([place]) On no medications at home, her last known A1c was 6.1.  Chronic diastolic heart failure (HCC) EF was 55% with a grade 2 diastolic heart failure on 10/02/2013.     DVT Prophylaxis - Lovenox ordered.  Family Communication: daughter Disposition Plan: daughter thinks she needs to go to SNF Code Status:      IV Access:    Peripheral IV   Procedures and diagnostic studies:   No results found.   Medical Consultants:    None.  Anti-Infectives:   Anti-infectives    Start     Dose/Rate Route Frequency Ordered Stop   08/16/15 0000  levofloxacin (LEVAQUIN) 500 MG tablet     500 mg Oral Daily 08/16/15 1239     08/15/15 1215  levofloxacin (LEVAQUIN) tablet 500 mg     500 mg Oral Daily 08/15/15 1212     08/14/15 0500  azithromycin (ZITHROMAX) 500 mg in dextrose 5 % 250 mL IVPB  Status:  Discontinued     500 mg 250 mL/hr  over 60 Minutes Intravenous Every 24 hours 08/13/15 1030 08/15/15 1212   08/14/15 0500  cefTRIAXone (ROCEPHIN) 1 g in dextrose 5 % 50 mL IVPB  Status:  Discontinued     1 g 100 mL/hr over 30 Minutes Intravenous Every 24 hours 08/13/15 1030 08/15/15 1212   08/13/15 2200  cefTRIAXone (ROCEPHIN) 1 g in dextrose 5 % 50 mL IVPB  Status:  Discontinued     1 g 100 mL/hr over 30 Minutes Intravenous Every 24 hours 08/13/15 0553 08/13/15 1030   08/13/15 2200  azithromycin (ZITHROMAX) 500 mg in dextrose 5 % 250 mL IVPB  Status:  Discontinued     500 mg 250 mL/hr over 60 Minutes Intravenous Every 24 hours 08/13/15 0553 08/13/15 1030   08/13/15 0215  cefTRIAXone (ROCEPHIN) 1 g in dextrose 5 % 50 mL IVPB     1 g 100 mL/hr over 30 Minutes Intravenous  Once 08/13/15 0204 08/13/15 0329   08/13/15 0215  azithromycin (ZITHROMAX) 500 mg in dextrose 5 % 250 mL IVPB     500 mg 250 mL/hr over 60 Minutes Intravenous  Once 08/13/15 0204 08/13/15 0426      Subjective:    Lives with daughter and is able to ambulate to chair/kitchen and bathroom with walker Breathing improved  Objective:    Filed Vitals:   08/16/15 0902 08/16/15 0951 08/16/15 0955 08/16/15 1307  BP: 141/67 125/74 125/74 122/54  Pulse: 85 92 92 71  Temp: 97.9 F (  36.6 C)   98.1 F (36.7 C)  TempSrc: Oral   Oral  Resp: 18   18  Height:      Weight:      SpO2: 93%   93%    Intake/Output Summary (Last 24 hours) at 08/16/15 1323 Last data filed at 08/16/15 0900  Gross per 24 hour  Intake  407.5 ml  Output   2075 ml  Net -1667.5 ml   Filed Weights   08/13/15 0005 08/13/15 0406 08/14/15 0300  Weight: 86.183 kg (190 lb) 83.3 kg (183 lb 10.3 oz) 84.4 kg (186 lb 1.1 oz)    Exam: Gen:  NAD Cardiovascular:  RRR,- JVD Chest and lungs:   Good air movement and clear to auscultation. Abdomen:  Abdomen soft, NT/ND, + BS Extremities:  No C/E/C   Data Reviewed:    Labs: Basic Metabolic Panel:  Recent Labs Lab 08/13/15 0033  08/13/15 1530 08/14/15 0215 08/15/15 0555 08/16/15 0409  NA 137 137 141 140 139  K 4.0 3.7 4.0 3.9 4.0  CL 107 109 111 110 107  CO2 18* 23 23 23 26   GLUCOSE 176* 128* 112* 121* 95  BUN 18 22* 20 12 8   CREATININE 1.51* 1.14* 1.01* 0.86 0.70  CALCIUM 9.6 8.9 8.9 9.2 9.3   GFR Estimated Creatinine Clearance: 50.1 mL/min (by C-G formula based on Cr of 0.7). Liver Function Tests:  Recent Labs Lab 08/13/15 0033  AST 28  ALT 18  ALKPHOS 94  BILITOT 0.5  PROT 5.9*  ALBUMIN 3.0*   No results for input(s): LIPASE, AMYLASE in the last 168 hours. No results for input(s): AMMONIA in the last 168 hours. Coagulation profile No results for input(s): INR, PROTIME in the last 168 hours.  CBC:  Recent Labs Lab 08/13/15 0033 08/14/15 0215 08/15/15 0555 08/16/15 0409  WBC 16.6* 10.0 9.0 9.2  HGB 14.3 11.1* 11.0* 10.8*  HCT 45.2 34.8* 35.1* 34.9*  MCV 96.0 94.8 96.4 95.4  PLT 186 161 144* 159   Cardiac Enzymes:  Recent Labs Lab 08/13/15 0033  TROPONINI <0.03   BNP (last 3 results) No results for input(s): PROBNP in the last 8760 hours. CBG:  Recent Labs Lab 08/13/15 0116 08/13/15 0827  GLUCAP 163* 89   D-Dimer: No results for input(s): DDIMER in the last 72 hours. Hgb A1c: No results for input(s): HGBA1C in the last 72 hours. Lipid Profile: No results for input(s): CHOL, HDL, LDLCALC, TRIG, CHOLHDL, LDLDIRECT in the last 72 hours. Thyroid function studies: No results for input(s): TSH, T4TOTAL, T3FREE, THYROIDAB in the last 72 hours.  Invalid input(s): FREET3 Anemia work up: No results for input(s): VITAMINB12, FOLATE, FERRITIN, TIBC, IRON, RETICCTPCT in the last 72 hours. Sepsis Labs:  Recent Labs Lab 08/13/15 0033 08/13/15 0059 08/13/15 0330 08/13/15 0645 08/13/15 0955 08/14/15 0215 08/15/15 0555 08/16/15 0409  PROCALCITON  --   --   --  <0.10  --   --   --   --   WBC 16.6*  --   --   --   --  10.0 9.0 9.2  LATICACIDVEN  --  5.29* 3.40* 1.7 1.2   --   --   --    Microbiology Recent Results (from the past 240 hour(s))  Urine culture     Status: None   Collection Time: 08/13/15 12:10 AM  Result Value Ref Range Status   Specimen Description URINE, RANDOM  Final   Special Requests NONE  Final   Culture NO  GROWTH 1 DAY  Final   Report Status 08/14/2015 FINAL  Final  Blood culture (routine x 2)     Status: None (Preliminary result)   Collection Time: 08/13/15 12:24 AM  Result Value Ref Range Status   Specimen Description BLOOD RIGHT HAND  Final   Special Requests BOTTLES DRAWN AEROBIC ONLY 2CC  Final   Culture NO GROWTH 2 DAYS  Final   Report Status PENDING  Incomplete  Blood culture (routine x 2)     Status: None (Preliminary result)   Collection Time: 08/13/15 12:32 AM  Result Value Ref Range Status   Specimen Description BLOOD LEFT HAND  Final   Special Requests BOTTLES DRAWN AEROBIC ONLY 1CC  Final   Culture NO GROWTH 2 DAYS  Final   Report Status PENDING  Incomplete  MRSA PCR Screening     Status: Abnormal   Collection Time: 08/13/15  3:50 AM  Result Value Ref Range Status   MRSA by PCR POSITIVE (A) NEGATIVE Final    Comment:        The GeneXpert MRSA Assay (FDA approved for NASAL specimens only), is one component of a comprehensive MRSA colonization surveillance program. It is not intended to diagnose MRSA infection nor to guide or monitor treatment for MRSA infections. RESULT CALLED TO, READ BACK BY AND VERIFIED WITH: ROBERTS @0607  08/13/15 MKELLY   C difficile quick scan w PCR reflex     Status: None   Collection Time: 08/13/15  6:07 AM  Result Value Ref Range Status   C Diff antigen NEGATIVE NEGATIVE Final   C Diff toxin NEGATIVE NEGATIVE Final   C Diff interpretation Negative for toxigenic C. difficile  Final     Medications:   . antiseptic oral rinse  7 mL Mouth Rinse BID  . aspirin EC  81 mg Oral QPM  . benazepril  40 mg Oral Daily  . Chlorhexidine Gluconate Cloth  6 each Topical Q0600  .  enoxaparin (LOVENOX) injection  40 mg Subcutaneous Daily  . ipratropium-albuterol  3 mL Nebulization TID  . isosorbide mononitrate  30 mg Oral Daily  . levofloxacin  500 mg Oral Daily  . metoprolol succinate  25 mg Oral Daily  . mupirocin ointment  1 application Nasal BID  . QUEtiapine  50 mg Oral BID  . sertraline  50 mg Oral Daily  . tiotropium  18 mcg Inhalation Daily  . verapamil  120 mg Oral BID   Continuous Infusions: . sodium chloride 10 mL/hr at 08/16/15 0411    Time spent: 15 min   LOS: 3 days   Tykel Badie [facility]  [facility] Pager [phone]  *Please refer to [email or web address], password [email or web address] to get updated schedule on who will round on this patient, as hospitalists switch teams weekly. If 7PM-7AM, please contact night-coverage at [email or web address], password [email or web address] for any overnight needs.  08/16/2015, 1:23 PM

## 2015-08-16 NOTE — Clinical Social Work Note (Signed)
Clinical Social Work Assessment  Patient Details  Name: Adrienne [name] MRN: [id number] Date of Birth: 1924-04-28  Date of referral:  08/16/15               Reason for consult:  Facility Placement                Permission sought to share information with:  Facility Sport and exercise psychologist, Family Supports Permission granted to share information::  Yes, Verbal Permission Granted  Name::     [place]::  [facility] SNF's  Relationship::  Daughter  Contact Information:  [phone]  Housing/Transportation Living arrangements for the past 2 months:  [place] of Information:  Patient, Adult Children Patient Interpreter Needed:  None Criminal Activity/Legal Involvement Pertinent to Current Situation/Hospitalization:  No - Comment as needed Significant Relationships:  Adult Children Lives with:  Adult Children (Lived w/ daughter, Donia Guiles.) Do you feel safe going back to the place where you live?  No Need for family participation in patient care:  Yes (Comment)  Care giving concerns:  CSW received referral for possible SNF placement at time of discharge. CSW met with patient regarding PT recommendation of SNF placement at time of discharge. Per patient, patient's daughter, Donia Guiles, is currently unable to care for patient at their home given patient's current physical needs and fall risk. Patient expressed understanding of PT recommendation and is agreeable to SNF placement at time of discharge. CSW to continue to follow and assist with discharge planning needs.   Social Worker assessment / plan:  CSW spoke with patient concerning possibility of rehab at SNF before returning home w/ patient's daughter.  Employment status:  Retired Forensic scientist:  Medicare PT Recommendations:  [place] / Referral to community resources:  [place]  Patient/Family's Response to care: Patient and patient's daughter recognize  need for rehab before returning home and are agreeable to a SNF in [place]. Patient reported preference for [facility] since she has been there before and it is close to the patient's home.  Patient/Family's Understanding of and Emotional Response to Diagnosis, Current Treatment, and Prognosis:  Patient is realistic regarding therapy needs. No questions/concerns about plan or treatment.    Emotional Assessment Appearance:  Appears stated age Attitude/Demeanor/Rapport:    Affect (typically observed):  Accepting, Appropriate Orientation:  Oriented to Self, Oriented to Place, Oriented to  Time, Oriented to Situation Alcohol / Substance use:  Not Applicable Psych involvement (Current and /or in the community):  No (Comment)  Discharge Needs  Concerns to be addressed:  Care Coordination Readmission within the last 30 days:  No Current discharge risk:  None Barriers to Discharge:  Continued Medical Work up   [employer], LCSW 08/16/2015, 4:04 PM

## 2015-08-16 NOTE — Evaluation (Signed)
Physical Therapy Evaluation Patient Details Name: Adrienne [name] MRN: [id number] DOB: 1924-06-03 Today's Date: 08/16/2015   History of Present Illness  Pt is an 81 y/o female who presents s/p syncopal episode at home. She was found to have RLL PNA. PMH signficant for CHF, COPD, HTN, DM2.  Clinical Impression  Pt admitted with above diagnosis. Pt currently with functional limitations due to the deficits listed below (see PT Problem List). At the time of PT eval pt was very fatigued and somewhat lethargic, however agreeable to work with therapy. Pt was able to achieve ~4 feet of ambulation before requiring an urgent transition to sitting due to legs buckling and general fatigue. I do not feel this patient is safe to return home at this time. The patient reports that her daughter is not able to care for her at home physically, although she is available 24 hours if needed. Feel this patient would benefit from short term rehab at the SNF level prior to returning home. Pt will benefit from skilled PT to increase their independence and safety with mobility to allow discharge to the venue listed below.     Follow Up Recommendations SNF;Supervision/Assistance - 24 hour    Equipment Recommendations  Rolling walker with 5" wheels    Recommendations for Other Services       Precautions / Restrictions Precautions Precautions: Fall Restrictions Weight Bearing Restrictions: No      Mobility  Bed Mobility               General bed mobility comments: Pt sitting up in the recliner upon PT arrival.   Transfers Overall transfer level: Needs assistance Equipment used: Rolling walker (2 wheeled) Transfers: Sit to/from Stand Sit to Stand: Min assist         General transfer comment: Assist to power up to full standing position.   Ambulation/Gait Ambulation/Gait assistance: Min assist Ambulation Distance (Feet): 4 Feet Assistive device: Rolling walker (2 wheeled) Gait  Pattern/deviations: Step-to pattern;Decreased stride length;Trunk flexed Gait velocity: Decreased Gait velocity interpretation: Below normal speed for age/gender General Gait Details: Pt was able to ambulate about 4 feet before knees started buckling and pt required urgent seated break. Chair was pulled up behind patient.   Stairs            Wheelchair Mobility    Modified Rankin (Stroke Patients Only)       Balance Overall balance assessment: Needs assistance Sitting-balance support: Feet supported;No upper extremity supported Sitting balance-Leahy Scale: Fair     Standing balance support: Bilateral upper extremity supported;During functional activity Standing balance-Leahy Scale: Poor                               Pertinent Vitals/Pain Pain Assessment: No/denies pain    Home Living Family/patient expects to be discharged to:: Private residence Living Arrangements: Children Available Help at Discharge: Family;Available 24 hours/day Type of Home: House Home Access: Stairs to enter   [employer] of Steps: 4 in front, level in back Home Layout: One level Home Equipment: Walker - 4 wheels;Shower seat;Wheelchair - manual Additional Comments: Pt reports that daughter cannot physically take care of her, but the daughter is there with her 24 hours a day.     Prior Function Level of Independence: Independent with assistive device(s)         Comments: Pt reports using the rollator for ambulation, but indpendence with all ADL's.  Hand Dominance   Dominant Hand: Right    Extremity/Trunk Assessment   Upper Extremity Assessment: Defer to OT evaluation           Lower Extremity Assessment: Generalized weakness      Cervical / Trunk Assessment: Kyphotic  Communication   Communication: No difficulties  Cognition Arousal/Alertness: Lethargic Behavior During Therapy: Flat affect Overall Cognitive Status: No family/caregiver present  to determine baseline cognitive functioning                      General Comments      Exercises        Assessment/Plan    PT Assessment Patient needs continued PT services  PT Diagnosis Difficulty walking;Generalized weakness   PT Problem List Decreased strength;Decreased range of motion;Decreased activity tolerance;Decreased balance;Decreased mobility;Decreased knowledge of use of DME;Decreased safety awareness;Decreased knowledge of precautions  PT Treatment Interventions DME instruction;Gait training;Stair training;Functional mobility training;Therapeutic activities;Therapeutic exercise;Neuromuscular re-education;Patient/family education   PT Goals (Current goals can be found in the Care Plan section) Acute Rehab PT Goals Patient Stated Goal: Feel better PT Goal Formulation: With patient Time For Goal Achievement: 08/23/15 Potential to Achieve Goals: Fair    Frequency Min 2X/week   Barriers to discharge Decreased caregiver support;Inaccessible home environment Pt reports she has stairs to enter home, and daughter is not able to physically assist her around the house.     Co-evaluation               End of Session Equipment Utilized During Treatment: Gait belt Activity Tolerance: Patient limited by fatigue Patient left: in chair;with chair alarm set;with call bell/phone within reach Nurse Communication: Mobility status         Time: [id number] PT Time Calculation (min) (ACUTE ONLY): 17 min   Charges:   PT Evaluation $Initial PT Evaluation Tier I: 1 Procedure     PT G Codes:        Rolinda Roan August 19, 2015, 2:49 PM   Rolinda Roan, PT, DPT Acute Rehabilitation Services Pager: [phone]

## 2015-08-16 NOTE — NC FL2 (Signed)
[place] LEVEL OF CARE SCREENING TOOL     IDENTIFICATION  Patient Name: Adrienne [name] Birthdate: 1924-04-14 Sex: female Admission Date (Current Location): 08/12/2015  [facility] and [place] Number: Herbalist and Address:  [facility], [place] [address] [place], [place] [address]      Provider Number: [id number]  Attending Physician Name and Address:  Geradine Girt, DO  Relative Name and Phone Number:  Rozanna Box, [phone]    Current Level of Care: Hospital Recommended Level of Care: [place] Prior Approval Number:    Date Approved/Denied:   PASRR Number: [id number]  Discharge Plan: SNF    Current Diagnoses: Patient Active Problem List   Diagnosis Date Noted  . CAP (community acquired pneumonia) 08/13/2015  . Sepsis ([place]) 08/13/2015  . Hypotension 08/13/2015  . Acute diarrhea 08/13/2015  . Hypovolemic shock ([employer]) 08/13/2015  . Altered mental status 08/08/2014  . UTI (urinary tract infection) 08/08/2014  . Depression 08/08/2014  . Acute cystitis without hematuria   . Physical deconditioning   . Acute on chronic respiratory failure ([place]) 05/07/2014  . CKD (chronic kidney disease) 02/04/2014  . Chronic diastolic heart failure ([place]) 12/05/2013  . Weakness 10/15/2013  . Nausea vomiting and diarrhea 10/15/2013  . Dehydration 10/15/2013  . DM (diabetes mellitus) ([place]) 10/15/2013  . Respiratory failure, acute ([place]) 10/02/2013  . Acute exacerbation of chronic obstructive pulmonary disease (COPD) ([place]) 10/01/2013  . Hypertension 10/01/2013  . AKI (acute kidney injury) ([place]) 10/01/2013  . COPD (chronic obstructive pulmonary disease) ([place]) 10/01/2013  . Diabetes ([place]) 10/01/2013  . Elevated brain natriuretic peptide (BNP) level 10/01/2013    Orientation ACTIVITIES/SOCIAL BLADDER RESPIRATION    Self, Time, Situation, Place  Family supportive Continent Normal  BEHAVIORAL SYMPTOMS/MOOD  NEUROLOGICAL BOWEL NUTRITION STATUS      Incontinent  (See DC Summary)  PHYSICIAN VISITS COMMUNICATION OF NEEDS Height & Weight Skin    Verbally   186 lbs. Normal          AMBULATORY STATUS RESPIRATION    Assist extensive Normal      Personal Care Assistance Level of Assistance  Bathing, Feeding, Dressing Bathing Assistance: Maximum assistance Feeding assistance: Limited assistance (Able to feed self.) Dressing Assistance: Maximum assistance      Functional Limitations Info                SPECIAL CARE FACTORS FREQUENCY  PT (By licensed PT)     PT Frequency: 5x/week             Additional Factors Info  Code Status, Allergies Code Status Info: Full Allergies Info: Tetanus Toxoids, Latex, Penicillins           Current Medications (08/16/2015):  This is the current hospital active medication list Current Facility-Administered Medications  Medication Dose Route Frequency Provider Last Rate Last Dose  . 0.9 %  sodium chloride infusion   Intravenous Continuous Charlynne Cousins, MD 10 mL/hr at 08/16/15 0411    . acetaminophen (TYLENOL) tablet 650 mg  650 mg Oral Q6H PRN Theressa Millard, MD       Or  . acetaminophen (TYLENOL) suppository 650 mg  650 mg Rectal Q6H PRN Theressa Millard, MD      . alum & mag hydroxide-simeth (MAALOX/MYLANTA) 200-200-20 MG/5ML suspension 30 mL  30 mL Oral Q6H PRN Theressa Millard, MD      . antiseptic oral rinse (CPC / CETYLPYRIDINIUM CHLORIDE 0.05%) solution 7 mL  7 mL Mouth Rinse BID Charlynne Cousins, MD   7 mL at 08/16/15 1348  . aspirin EC tablet 81 mg  81 mg Oral QPM Theressa Millard, MD   81 mg at 08/15/15 1809  . [START ON 08/17/2015] benazepril (LOTENSIN) tablet 5 mg  5 mg Oral Daily Geradine Girt, DO      . Chlorhexidine Gluconate Cloth 2 % PADS 6 each  6 each Topical Q0600 Charlynne Cousins, MD   6 each at 08/16/15 [phone]  . enoxaparin (LOVENOX) injection 40 mg  40 mg Subcutaneous Daily Kris Mouton, RPH   40 mg  at 08/16/15 [id number]  . ipratropium-albuterol (DUONEB) 0.5-2.5 (3) MG/3ML nebulizer solution 3 mL  3 mL Nebulization TID Charlynne Cousins, MD   3 mL at 08/16/15 1450  . ipratropium-albuterol (DUONEB) 0.5-2.5 (3) MG/3ML nebulizer solution 3 mL  3 mL Nebulization Q6H PRN Charlynne Cousins, MD   3 mL at 08/15/15 0416  . isosorbide mononitrate (IMDUR) 24 hr tablet 30 mg  30 mg Oral Daily Charlynne Cousins, MD   30 mg at 08/16/15 0956  . levofloxacin (LEVAQUIN) tablet 500 mg  500 mg Oral Daily Charlynne Cousins, MD   500 mg at 08/16/15 0957  . LORazepam (ATIVAN) tablet 1 mg  1 mg Oral Q6H PRN Theressa Millard, MD      . Derrill Memo ON 08/17/2015] metoprolol succinate (TOPROL-XL) 24 hr tablet 12.5 mg  12.5 mg Oral Daily Geradine Girt, DO      . mupirocin ointment (BACTROBAN) 2 % 1 application  1 application Nasal BID Charlynne Cousins, MD   1 application at [id number] [phone]  . ondansetron (ZOFRAN) tablet 4 mg  4 mg Oral Q6H PRN Theressa Millard, MD       Or  . ondansetron (ZOFRAN) injection 4 mg  4 mg Intravenous Q6H PRN Theressa Millard, MD      . oxyCODONE (Oxy IR/ROXICODONE) immediate release tablet 5 mg  5 mg Oral Q4H PRN Theressa Millard, MD      . QUEtiapine (SEROQUEL) tablet 50 mg  50 mg Oral BID Charlynne Cousins, MD   50 mg at 08/16/15 0957  . sertraline (ZOLOFT) tablet 50 mg  50 mg Oral Daily Theressa Millard, MD   50 mg at 08/16/15 0957  . sodium chloride 0.9 % bolus 500 mL  500 mL Intravenous Once Geradine Girt, DO   500 mL at 08/16/15 1554  . tiotropium (SPIRIVA) inhalation capsule 18 mcg  18 mcg Inhalation Daily Charlynne Cousins, MD   18 mcg at 08/16/15 0801  . verapamil (CALAN) tablet 40 mg  40 mg Oral BID Geradine Girt, DO         Discharge Medications: Please see discharge summary for a list of discharge medications.  Relevant Imaging Results:  Relevant Lab Results:  Recent Labs    Additional [place], LCSW

## 2015-08-16 NOTE — Care Management Important Message (Signed)
Important Message  Patient Details  Name: Adrienne [name] MRN: [id number] Date of Birth: 09-03-1924   Medicare Important Message Given:  Yes    Carles Collet, RN 08/16/2015, 11:53 AMImportant Message  Patient Details  Name: Adrienne [name] MRN: [id number] Date of Birth: 03-07-1924   Medicare Important Message Given:  Yes    Carles Collet, RN 08/16/2015, 11:53 AM

## 2015-08-16 NOTE — Consult Note (Signed)
Adrienne Mijangos EdD 

## 2015-08-17 DIAGNOSIS — R571 Hypovolemic shock: Secondary | ICD-10-CM

## 2015-08-17 DIAGNOSIS — I5032 Chronic diastolic (congestive) heart failure: Secondary | ICD-10-CM

## 2015-08-17 DIAGNOSIS — J189 Pneumonia, unspecified organism: Secondary | ICD-10-CM

## 2015-08-17 DIAGNOSIS — R197 Diarrhea, unspecified: Secondary | ICD-10-CM

## 2015-08-17 NOTE — Progress Notes (Signed)
Physical Therapy Treatment Patient Details Name: Adrienne [name] MRN: [id number] DOB: 08/10/1924 Today's Date: 08/17/2015    History of Present Illness Pt is an 81 y/o female who presents s/p syncopal episode at home. She was found to have RLL PNA. PMH signficant for CHF, COPD, HTN, DM2.    PT Comments    Pt is getting up to bedside well with PT assisting her less today.  Plan is to go to SNF which is wise given her instability with gait and transfers, would not be able to have her daughter leave her side yet at home.  Will continue PT and will look at balance and endurance needs.  Follow Up Recommendations  SNF     Equipment Recommendations  Rolling walker with 5" wheels    Recommendations for Other Services       Precautions / Restrictions Precautions Precautions: Fall Restrictions Weight Bearing Restrictions: No    Mobility  Bed Mobility Overal bed mobility: Needs Assistance Bed Mobility: Supine to Sit;Sit to Supine     Supine to sit: Min guard Sit to supine: Min guard;Min assist      Transfers Overall transfer level: Needs assistance Equipment used: Rolling walker (2 wheeled) Transfers: Sit to/from [facility] Sit to Stand: Min assist Stand pivot transfers: Min assist       General transfer comment: Assist to power up to full standing position.  (reminders for hand placement 100%)  Ambulation/Gait Ambulation/Gait assistance: Min guard;Min assist Ambulation Distance (Feet): 20 Feet Assistive device: Rolling walker (2 wheeled) Gait Pattern/deviations: Step-through pattern;Decreased stride length;Shuffle;Wide base of support;Trunk flexed Gait velocity: Decreased Gait velocity interpretation: Below normal speed for age/gender General Gait Details: sat twice to manage buckling   Stairs            Wheelchair Mobility    Modified Rankin (Stroke Patients Only)       Balance Overall balance assessment: Needs  assistance Sitting-balance support: Feet supported Sitting balance-Leahy Scale: Good   Postural control: Posterior lean Standing balance support: Bilateral upper extremity supported Standing balance-Leahy Scale: Poor                      Cognition Arousal/Alertness: Awake/alert Behavior During Therapy: WFL for tasks assessed/performed Overall Cognitive Status: Within Functional Limits for tasks assessed                      Exercises      General Comments General comments (skin integrity, edema, etc.): Pt is moving better today and did walk with nursing around her room earlier before PT arrival      Pertinent Vitals/Pain Pain Assessment: 0-10 Pain Score: 5  Pain Location: low back Pain Descriptors / Indicators: Aching Pain Intervention(s): Monitored during session;Repositioned    Home Living                      Prior Function            PT Goals (current goals can now be found in the care plan section) Acute Rehab PT Goals Patient Stated Goal: Feel better Progress towards PT goals: Progressing toward goals    Frequency  Min 2X/week    PT Plan Current plan remains appropriate    Co-evaluation             End of Session Equipment Utilized During Treatment: Gait belt Activity Tolerance: Patient tolerated treatment well;Patient limited by fatigue Patient left: in bed;with call bell/phone within  reach;with bed alarm set     Time: 1402-1420 PT Time Calculation (min) (ACUTE ONLY): 18 min  Charges:  $Gait Training: 8-22 mins                    G Codes:      Ramond Dial 08/19/15, 2:41 PM   Mee Hives, PT MS Acute Rehab Dept. Number: [facility] [phone] and [place] [phone]

## 2015-08-17 NOTE — Progress Notes (Signed)
Patient ID: SAY MILICH, female   DOB: [birth year], 81 y.o.   MRN: [id number]  [facility] PROGRESS NOTE  Adrienne [name] [id number] DOB: 16-May-1924 DOA: 08/12/2015 PCP: Thressa Sheller, MD   Brief narrative:    81 y.o. female past history of heart failure, COPD, hypertension and diabetes who is brought to the ED after a syncopal episode. The patient relates she has been having several episodes of diarrhea that started on day of admission accompanied by weakness.  Assessment/Plan:    Syncope due to have the child/Sepsis due to CAP (community acquired pneumonia) with acute respiratory failure with hypoxia: - Her syncope was likely due to hypovolemic shock.  - BP slightly low in the past 24 hours but better this AM - Patient is currently on benazepril, Imdur, metoprolol - Monitor overnight, if blood pressure remains stable, patient can be discharged to skilled nursing facility in the morning  Acute kidney injury: - In the setting of hypotension and ACE inhibitor use. - Resolved with IV fluids  Thrombocytopenia - Resolved  Acute diarrhea: - C. difficile negative, diarrhea resolved  COPD (chronic obstructive pulmonary disease) (HCC) - Continue bronchodilators if needed  DM (diabetes mellitus) ([place]) - On no medications at home, her last known A1c was 6.1.  Chronic diastolic heart failure (HCC) - EF was 55% with a grade 2 diastolic heart failure on 10/02/2013.  Obesity - Body mass index is 31.92 kg/(m^2).   DVT Prophylaxis - Lovenox ordered.  Code Status: Full.  Family Communication:  plan of care discussed with the patient Disposition Plan: SNF in AM 12/02  IV access:  Peripheral IV  Procedures and diagnostic studies:    Dg Chest Portable 1 View  08/13/2015  CLINICAL DATA:  Hypotension.  Altered mental status. EXAM: PORTABLE CHEST 1 VIEW COMPARISON:  08/08/2014 FINDINGS: Shallow inspiration with elevation of right hemidiaphragm. Developing  atelectasis or infiltration in the right lung base since previous study. Developing pneumonia is not excluded. Left lung appears expanded without focal consolidation demonstrated on the left. No blunting of costophrenic angles. No pneumothorax. Normal heart size and pulmonary vascularity for technique. IMPRESSION: Elevation of right hemidiaphragm with increasing atelectasis or infiltration in the right lung base since previous study. Electronically Signed   By: Lucienne Capers M.D.   On: 08/13/2015 00:55    Medical Consultants:  PT  Other Consultants:  None  IAnti-Infectives:   None  Faye Ramsay, MD  [facility] Pager [phone]  If 7PM-7AM, please contact night-coverage [email or web address] Password [email or web address] 08/17/2015, 2:26 PM   LOS: 4 days   HPI/Subjective: No events overnight.   Objective: Filed Vitals:   08/17/15 0605 08/17/15 0818 08/17/15 1014 08/17/15 1304  BP: 151/73  135/50 116/68  Pulse: 70  80 77  Temp: 98.2 F (36.8 C)   97.3 F (36.3 C)  TempSrc: Oral   Oral  Resp: 18   24  Height:      Weight:      SpO2: 93% 93%  94%    Intake/Output Summary (Last 24 hours) at 08/17/15 1426 Last data filed at 08/17/15 1216  Gross per 24 hour  Intake    500 ml  Output   1500 ml  Net  -1000 ml    Exam:   General:  Pt is alert, follows commands appropriately, not in acute distress  Cardiovascular: Regular rate and rhythm, no rubs, no gallops  Respiratory: Clear to auscultation bilaterally, no wheezing, no crackles, no rhonchi  Abdomen: Soft, non tender, non distended, bowel sounds  present, no guarding   Data Reviewed: Basic Metabolic Panel:  Recent Labs Lab 08/13/15 0033 08/13/15 1530 08/14/15 0215 08/15/15 0555 08/16/15 0409  NA 137 137 141 140 139  K 4.0 3.7 4.0 3.9 4.0  CL 107 109 111 110 107  CO2 18* 23 23 23 26   GLUCOSE 176* 128* 112* 121* 95  BUN 18 22* 20 12 8   CREATININE 1.51* 1.14* 1.01* 0.86 0.70  CALCIUM 9.6 8.9 8.9 9.2 9.3   Liver Function  Tests:  Recent Labs Lab 08/13/15 0033  AST 28  ALT 18  ALKPHOS 94  BILITOT 0.5  PROT 5.9*  ALBUMIN 3.0*   CBC:  Recent Labs Lab 08/13/15 0033 08/14/15 0215 08/15/15 0555 08/16/15 0409  WBC 16.6* 10.0 9.0 9.2  HGB 14.3 11.1* 11.0* 10.8*  HCT 45.2 34.8* 35.1* 34.9*  MCV 96.0 94.8 96.4 95.4  PLT 186 161 144* 159   Cardiac Enzymes:  Recent Labs Lab 08/13/15 0033  TROPONINI <0.03   CBG:  Recent Labs Lab 08/13/15 0116 08/13/15 0827  GLUCAP 163* 89    Recent Results (from the past 240 hour(s))  Urine culture     Status: None   Collection Time: 08/13/15 12:10 AM  Result Value Ref Range Status   Specimen Description URINE, RANDOM  Final   Special Requests NONE  Final   Culture NO GROWTH 1 DAY  Final   Report Status 08/14/2015 FINAL  Final  Blood culture (routine x 2)     Status: None (Preliminary result)   Collection Time: 08/13/15 12:24 AM  Result Value Ref Range Status   Specimen Description BLOOD RIGHT HAND  Final   Special Requests BOTTLES DRAWN AEROBIC ONLY 2CC  Final   Culture NO GROWTH 4 DAYS  Final   Report Status PENDING  Incomplete  Blood culture (routine x 2)     Status: None (Preliminary result)   Collection Time: 08/13/15 12:32 AM  Result Value Ref Range Status   Specimen Description BLOOD LEFT HAND  Final   Special Requests BOTTLES DRAWN AEROBIC ONLY 1CC  Final   Culture NO GROWTH 4 DAYS  Final   Report Status PENDING  Incomplete  MRSA PCR Screening     Status: Abnormal   Collection Time: 08/13/15  3:50 AM  Result Value Ref Range Status   MRSA by PCR POSITIVE (A) NEGATIVE Final  C difficile quick scan w PCR reflex     Status: None   Collection Time: 08/13/15  6:07 AM  Result Value Ref Range Status   C Diff antigen NEGATIVE NEGATIVE Final   C Diff toxin NEGATIVE NEGATIVE Final   C Diff interpretation Negative for toxigenic C. difficile  Final     Scheduled Meds: . antiseptic oral rinse  7 mL Mouth Rinse BID  . aspirin EC  81 mg Oral  QPM  . benazepril  5 mg Oral Daily  . Chlorhexidine Gluconate Cloth  6 each Topical Q0600  . enoxaparin (LOVENOX) injection  40 mg Subcutaneous Daily  . ipratropium-albuterol  3 mL Nebulization TID  . isosorbide mononitrate  30 mg Oral Daily  . levofloxacin  500 mg Oral Daily  . metoprolol succinate  12.5 mg Oral Daily  . mupirocin ointment  1 application Nasal BID  . QUEtiapine  50 mg Oral BID  . sertraline  50 mg Oral Daily  . tiotropium  18 mcg Inhalation Daily  . verapamil  40 mg Oral BID   Continuous Infusions: . sodium chloride 10 mL/hr  at 08/16/15 0411

## 2015-08-17 NOTE — Progress Notes (Signed)
Dr. Doyle Askew paged to clarify if patient will be discharged today for CSW to begin process.

## 2015-08-18 DIAGNOSIS — J438 Other emphysema: Secondary | ICD-10-CM

## 2015-08-18 LAB — CULTURE, BLOOD (ROUTINE X 2)
CULTURE: NO GROWTH
CULTURE: NO GROWTH

## 2015-08-18 MED ORDER — METOPROLOL SUCCINATE ER 25 MG PO TB24: 12.5000 mg | ORAL_TABLET | Freq: Every day | ORAL | Status: AC

## 2015-08-18 MED ORDER — LEVOFLOXACIN 500 MG PO TABS: 500.0000 mg | ORAL_TABLET | Freq: Every day | ORAL | Status: DC

## 2015-08-18 MED ORDER — LORAZEPAM 2 MG PO TABS: 1.0000 mg | ORAL_TABLET | Freq: Four times a day (QID) | ORAL | Status: AC | PRN

## 2015-08-18 MED ORDER — VERAPAMIL HCL 40 MG PO TABS: 40.0000 mg | ORAL_TABLET | Freq: Two times a day (BID) | ORAL | Status: AC

## 2015-08-18 MED ORDER — BENAZEPRIL HCL 5 MG PO TABS: 5.0000 mg | ORAL_TABLET | Freq: Every day | ORAL | Status: AC

## 2015-08-18 NOTE — Care Management Note (Signed)
Case Management Note  Patient Details  Name: Adrienne [name] MRN: [id number] Date of Birth: 1924/07/10  Subjective/Objective:                 Patient admitted with CAP.    Action/Plan:  Will DC to SNF as facilitated through SW.  Expected Discharge Date:                  Expected Discharge Plan:  Skilled Nursing Facility  In-House Referral:  Clinical Social Work  Discharge planning Services  CM Consult  Post Acute Care Choice:    Choice offered to:     DME Arranged:    DME Agency:     HH Arranged:    [place] Agency:     Status of Service:  Completed, signed off  Medicare Important Message Given:  Yes Date Medicare IM Given:    Medicare IM give by:    Date Additional Medicare IM Given:    Additional Medicare Important Message give by:     If discussed at [place] of Stay Meetings, dates discussed:    Additional Comments:  Carles Collet, RN 08/18/2015, 11:17 AM

## 2015-08-18 NOTE — Discharge Instructions (Signed)
Hypertension Hypertension, commonly called high blood pressure, is when the force of blood pumping through your arteries is too strong. Your arteries are the blood vessels that carry blood from your heart throughout your body. A blood pressure reading consists of a higher number over a lower number, such as 110/72. The higher number (systolic) is the pressure inside your arteries when your heart pumps. The lower number (diastolic) is the pressure inside your arteries when your heart relaxes. Ideally you want your blood pressure below 120/80. Hypertension forces your heart to work harder to pump blood. Your arteries may become narrow or stiff. Having untreated or uncontrolled hypertension can cause heart attack, stroke, kidney disease, and other problems. RISK FACTORS Some risk factors for high blood pressure are controllable. Others are not.  Risk factors you cannot control include:   Race. You may be at higher risk if you are African American.  Age. Risk increases with age.  Gender. Men are at higher risk than women before age 45 years. After age 65, women are at higher risk than men. Risk factors you can control include:  Not getting enough exercise or physical activity.  Being overweight.  Getting too much fat, sugar, calories, or salt in your diet.  Drinking too much alcohol. SIGNS AND SYMPTOMS Hypertension does not usually cause signs or symptoms. Extremely high blood pressure (hypertensive crisis) may cause headache, anxiety, shortness of breath, and nosebleed. DIAGNOSIS To check if you have hypertension, your health care provider will measure your blood pressure while you are seated, with your arm held at the level of your heart. It should be measured at least twice using the same arm. Certain conditions can cause a difference in blood pressure between your right and left arms. A blood pressure reading that is higher than normal on one occasion does not mean that you need treatment. If  it is not clear whether you have high blood pressure, you may be asked to return on a different day to have your blood pressure checked again. Or, you may be asked to monitor your blood pressure at home for 1 or more weeks. TREATMENT Treating high blood pressure includes making lifestyle changes and possibly taking medicine. Living a healthy lifestyle can help lower high blood pressure. You may need to change some of your habits. Lifestyle changes may include:  Following the DASH diet. This diet is high in fruits, vegetables, and whole grains. It is low in salt, red meat, and added sugars.  Keep your sodium intake below 2,300 mg per day.  Getting at least 30-45 minutes of aerobic exercise at least 4 times per week.  Losing weight if necessary.  Not smoking.  Limiting alcoholic beverages.  Learning ways to reduce stress. Your health care provider may prescribe medicine if lifestyle changes are not enough to get your blood pressure under control, and if one of the following is true:  You are 18-59 years of age and your systolic blood pressure is above 140.  You are 60 years of age or older, and your systolic blood pressure is above 150.  Your diastolic blood pressure is above 90.  You have diabetes, and your systolic blood pressure is over 140 or your diastolic blood pressure is over 90.  You have kidney disease and your blood pressure is above 140/90.  You have heart disease and your blood pressure is above 140/90. Your personal target blood pressure may vary depending on your medical conditions, your age, and other factors. HOME CARE INSTRUCTIONS    Have your blood pressure rechecked as directed by your health care provider.   Take medicines only as directed by your health care provider. Follow the directions carefully. Blood pressure medicines must be taken as prescribed. The medicine does not work as well when you skip doses. Skipping doses also puts you at risk for  problems.  Do not smoke.   Monitor your blood pressure at home as directed by your health care provider. SEEK MEDICAL CARE IF:   You think you are having a reaction to medicines taken.  You have recurrent headaches or feel dizzy.  You have swelling in your ankles.  You have trouble with your vision. SEEK IMMEDIATE MEDICAL CARE IF:  You develop a severe headache or confusion.  You have unusual weakness, numbness, or feel faint.  You have severe chest or abdominal pain.  You vomit repeatedly.  You have trouble breathing. MAKE SURE YOU:   Understand these instructions.  Will watch your condition.  Will get help right away if you are not doing well or get worse.   This information is not intended to replace advice given to you by your health care provider. Make sure you discuss any questions you have with your health care provider.   Document Released: 09/02/2005 Document Revised: 01/17/2015 Document Reviewed: 06/25/2013 Elsevier Interactive Patient Education 2016 Elsevier Inc.  

## 2015-08-18 NOTE — Discharge Summary (Signed)
Physician Discharge Summary  CHRISTEN GADDY [id number] DOB: [birth year] DOA: 08/12/2015  PCP: Thressa Sheller, MD  Admit date: 08/12/2015 Discharge date: 08/18/2015  Recommendations for Outpatient Follow-up:  1. Pt will need to follow up with PCP in 2-3 weeks post discharge 2. Please obtain BMP to evaluate electrolytes and kidney function 3. Please also check CBC to evaluate Hg and Hct levels 4. Take Levaquin for 3 more days post discharge   Discharge Diagnoses:  Principal Problem:   CAP (community acquired pneumonia) Active Problems:   COPD (chronic obstructive pulmonary disease) ([place])   Chronic diastolic heart failure (HCC)   Sepsis ([place])   Acute diarrhea   Hypovolemic shock ([place])  Discharge Condition: Stable  Diet recommendation: Heart healthy diet discussed in details    Brief narrative:    81 y.o. female past history of heart failure, COPD, hypertension and diabetes who is brought to the ED after a syncopal episode. The patient relates she has been having several episodes of diarrhea that started on day of admission accompanied by weakness.  Assessment/Plan:    Syncope due to have the child/Sepsis due to CAP (community acquired pneumonia) with acute respiratory failure with hypoxia: - Her syncope was likely due to hypovolemic shock.  - BP stable in the past 24 hours  - Patient is currently on benazepril, Imdur, metoprolol  Acute kidney injury: - In the setting of hypotension and ACE inhibitor use. - Resolved with IV fluids - low dose benazepril resumed and pt tolerating well so far  Thrombocytopenia - Resolved  Acute diarrhea: - C. difficile negative, diarrhea resolved  COPD (chronic obstructive pulmonary disease) (HCC) - Continue bronchodilators if needed  DM (diabetes mellitus) ([employer]) - On no medications at home, her last known A1c was 6.1.  Chronic diastolic heart failure (HCC) - EF was 55% with a grade 2 diastolic heart failure on  10/02/2013.  Obesity - Body mass index is 31.92 kg/(m^2).   Code Status: Full.  Family Communication: plan of care discussed with the patient Disposition Plan: SNF   IV access:  Peripheral IV  Procedures and diagnostic studies:    Imaging Results    Dg Chest Portable 1 View  08/13/2015 CLINICAL DATA: Hypotension. Altered mental status. EXAM: PORTABLE CHEST 1 VIEW COMPARISON: 08/08/2014 FINDINGS: Shallow inspiration with elevation of right hemidiaphragm. Developing atelectasis or infiltration in the right lung base since previous study. Developing pneumonia is not excluded. Left lung appears expanded without focal consolidation demonstrated on the left. No blunting of costophrenic angles. No pneumothorax. Normal heart size and pulmonary vascularity for technique. IMPRESSION: Elevation of right hemidiaphragm with increasing atelectasis or infiltration in the right lung base since previous study. Electronically Signed By: Lucienne Capers M.D. On: 08/13/2015 00:55     Medical Consultants:  PT  Other Consultants:  None       Discharge Exam: Filed Vitals:   08/17/15 2228 08/18/15 0608  BP: 133/60 118/64  Pulse: 60 68  Temp: 98.6 F (37 C) 98.2 F (36.8 C)  Resp: 16 15   Filed Vitals:   08/17/15 1014 08/17/15 1304 08/17/15 2228 08/18/15 0608  BP: 135/50 116/68 133/60 118/64  Pulse: 80 77 60 68  Temp:  97.3 F (36.3 C) 98.6 F (37 C) 98.2 F (36.8 C)  TempSrc:  Oral  Oral  Resp:  24 16 15   Height:      Weight:      SpO2:  94% 87% 95%    General: Pt is alert, follows commands appropriately, not in  acute distress Cardiovascular: Regular rate and rhythm, no rubs, no gallops Respiratory: Clear to auscultation bilaterally, no wheezing, no crackles, no rhonchi Abdominal: Soft, non tender, non distended, bowel sounds +, no guarding   Discharge Instructions  Discharge Instructions    Diet - low sodium heart healthy    Complete by:  As directed       Increase activity slowly    Complete by:  As directed             Medication List    TAKE these medications        acetaminophen 500 MG tablet  Commonly known as:  TYLENOL  Take 1,000 mg by mouth every 6 (six) hours as needed for mild pain (back pain).     ALAWAY OP  Apply 1 drop to eye 2 (two) times daily as needed (itchy eyes). Both eyes For itchy eyes     albuterol (2.5 MG/3ML) 0.083% nebulizer solution  Commonly known as:  PROVENTIL  Take 2.5 mg by nebulization 2 (two) times daily.     albuterol 108 (90 BASE) MCG/ACT inhaler  Commonly known as:  PROVENTIL HFA;VENTOLIN HFA  Inhale 2 puffs into the lungs every 6 (six) hours as needed for wheezing or shortness of breath.     aspirin EC 81 MG tablet  Take 81 mg by mouth every evening.     benazepril 5 MG tablet  Commonly known as:  LOTENSIN  Take 1 tablet (5 mg total) by mouth daily.     diphenhydramine-acetaminophen 25-500 MG Tabs tablet  Commonly known as:  TYLENOL PM  Take 1.5 tablets by mouth at bedtime as needed (pain).     isosorbide mononitrate 30 MG 24 hr tablet  Commonly known as:  IMDUR  Take 1 tablet (30 mg total) by mouth daily.     levofloxacin 500 MG tablet  Commonly known as:  LEVAQUIN  Take 1 tablet (500 mg total) by mouth daily.     LORazepam 2 MG tablet  Commonly known as:  ATIVAN  Take 0.5 tablets (1 mg total) by mouth every 6 (six) hours as needed for anxiety.     metoprolol succinate 25 MG 24 hr tablet  Commonly known as:  TOPROL-XL  Take 0.5 tablets (12.5 mg total) by mouth daily.     promethazine 25 MG tablet  Commonly known as:  PHENERGAN  Take 12.5 mg by mouth every 6 (six) hours as needed for nausea or vomiting (nausea).     QUEtiapine 100 MG tablet  Commonly known as:  SEROQUEL  Take 50-100 mg by mouth 2 (two) times daily. Depending on level of agitation.     sertraline 50 MG tablet  Commonly known as:  ZOLOFT  Take 50 mg by mouth daily.     STOOL SOFTENER PO  Take 1  tablet by mouth at bedtime as needed (constipation).     tiotropium 18 MCG inhalation capsule  Commonly known as:  SPIRIVA HANDIHALER  Place 1 capsule (18 mcg total) into inhaler and inhale daily.     verapamil 40 MG tablet  Commonly known as:  CALAN  Take 1 tablet (40 mg total) by mouth 2 (two) times daily.           Follow-up Information    Follow up with Thressa Sheller, MD In 1 week.   Specialty:  Internal Medicine   Contact information:   [place], [place] [address] [phone]       Follow up  with [facility].   Specialty:  [place] information:   [place] [phone]      Follow up with Thressa Sheller, MD.   Specialty:  Internal Medicine   Contact information:   [place], [place] [address] [phone]        The results of significant diagnostics from this hospitalization (including imaging, microbiology, ancillary and laboratory) are listed below for reference.     Microbiology: Recent Results (from the past 240 hour(s))  Urine culture     Status: None   Collection Time: 08/13/15 12:10 AM  Result Value Ref Range Status   Specimen Description URINE, RANDOM  Final   Special Requests NONE  Final   Culture NO GROWTH 1 DAY  Final   Report Status 08/14/2015 FINAL  Final  Blood culture (routine x 2)     Status: None (Preliminary result)   Collection Time: 08/13/15 12:24 AM  Result Value Ref Range Status   Specimen Description BLOOD RIGHT HAND  Final   Special Requests BOTTLES DRAWN AEROBIC ONLY 2CC  Final   Culture NO GROWTH 4 DAYS  Final   Report Status PENDING  Incomplete  Blood culture (routine x 2)     Status: None (Preliminary result)   Collection Time: 08/13/15 12:32 AM  Result Value Ref Range Status   Specimen Description BLOOD LEFT HAND  Final   Special Requests BOTTLES DRAWN AEROBIC ONLY 1CC  Final    Culture NO GROWTH 4 DAYS  Final   Report Status PENDING  Incomplete  MRSA PCR Screening     Status: Abnormal   Collection Time: 08/13/15  3:50 AM  Result Value Ref Range Status   MRSA by PCR POSITIVE (A) NEGATIVE Final    Comment:        The GeneXpert MRSA Assay (FDA approved for NASAL specimens only), is one component of a comprehensive MRSA colonization surveillance program. It is not intended to diagnose MRSA infection nor to guide or monitor treatment for MRSA infections. RESULT CALLED TO, READ BACK BY AND VERIFIED WITH: ROBERTS @0607  08/13/15 MKELLY   C difficile quick scan w PCR reflex     Status: None   Collection Time: 08/13/15  6:07 AM  Result Value Ref Range Status   C Diff antigen NEGATIVE NEGATIVE Final   C Diff toxin NEGATIVE NEGATIVE Final   C Diff interpretation Negative for toxigenic C. difficile  Final     Labs: Basic Metabolic Panel:  Recent Labs Lab 08/13/15 0033 08/13/15 1530 08/14/15 0215 08/15/15 0555 08/16/15 0409  NA 137 137 141 140 139  K 4.0 3.7 4.0 3.9 4.0  CL 107 109 111 110 107  CO2 18* 23 23 23 26   GLUCOSE 176* 128* 112* 121* 95  BUN 18 22* 20 12 8   CREATININE 1.51* 1.14* 1.01* 0.86 0.70  CALCIUM 9.6 8.9 8.9 9.2 9.3   Liver Function Tests:  Recent Labs Lab 08/13/15 0033  AST 28  ALT 18  ALKPHOS 94  BILITOT 0.5  PROT 5.9*  ALBUMIN 3.0*   CBC:  Recent Labs Lab 08/13/15 0033 08/14/15 0215 08/15/15 0555 08/16/15 0409  WBC 16.6* 10.0 9.0 9.2  HGB 14.3 11.1* 11.0* 10.8*  HCT 45.2 34.8* 35.1* 34.9*  MCV 96.0 94.8 96.4 95.4  PLT 186 161 144* 159   Cardiac Enzymes:  Recent Labs Lab 08/13/15 0033  TROPONINI <0.03    CBG:  Recent Labs  Lab 08/13/15 0116 08/13/15 0827  GLUCAP 163* 89     SIGNED: Time coordinating discharge:30 minutes  Faye Ramsay, MD  [facility] 08/18/2015, 9:39 AM Pager [phone]  If 7PM-7AM, please contact night-coverage [email or web address] Password [email or web address]

## 2015-08-18 NOTE — Progress Notes (Signed)
Patient will DC to: [place], [place] Anticipated DC date: 08/18/15 Family notified: Daughter, Science writer by: PTAR  CSW signing off.  Cedric Fishman, [place] Social Worker [phone]

## 2015-08-18 NOTE — Clinical Social Work Placement (Signed)
   CLINICAL SOCIAL WORK PLACEMENT  NOTE  Date:  08/18/2015  Patient Details  Name: Adrienne [name] MRN: [id number] Date of Birth: 04-Dec-1923  Clinical Social Work is seeking post-discharge placement for this patient at the [place] level of care (*CSW will initial, date and re-position this form in  chart as items are completed):  Yes   Patient/family provided with [place] Work Department's list of facilities offering this level of care within the geographic area requested by the patient (or if unable, by the patient's family).  Yes   Patient/family informed of their freedom to choose among providers that offer the needed level of care, that participate in Medicare, Medicaid or managed care program needed by the patient, have an available bed and are willing to accept the patient.  Yes   Patient/family informed of [facility]'s ownership interest in [facility] and [facility], as well as of the fact that they are under no obligation to receive care at these facilities.  PASRR submitted to EDS on 08/16/15     PASRR number received on 08/16/15     Existing PASRR number confirmed on       FL2 transmitted to all facilities in geographic area requested by pt/family on       FL2 transmitted to all facilities within larger geographic area on       Patient informed that his/her managed care company has contracts with or will negotiate with certain facilities, including the following:        Yes   Patient/family informed of bed offers received.  Patient chooses bed at [facility] [place]     Physician recommends and patient chooses bed at      Patient to be transferred to [facility] [place] on 08/18/15.  Patient to be transferred to facility by PTAR     Patient family notified on 08/18/15 of transfer.  Name of family member notified:  Rozanna Box, dtr     PHYSICIAN       Additional Comment:     _______________________________________________ Benard Halsted, LCSW 08/18/2015, 12:56 PM

## 2015-08-18 NOTE — Progress Notes (Signed)
Report given to [facility].  PIV removed.  Pt taken to discharge location via PTAR.

## 2015-08-25 ENCOUNTER — Emergency Department (HOSPITAL_COMMUNITY)
Admission: EM | Admit: 2015-08-25 | Discharge: 2015-08-26 | Disposition: A | Discharge status: Home / Self Care | Payer: Medicare Other | Attending: Emergency Medicine | Admitting: Emergency Medicine

## 2015-08-25 ENCOUNTER — Encounter (HOSPITAL_COMMUNITY): Payer: Self-pay | Admitting: Emergency Medicine

## 2015-08-25 DIAGNOSIS — M1990 Unspecified osteoarthritis, unspecified site: Secondary | ICD-10-CM | POA: Insufficient documentation

## 2015-08-25 DIAGNOSIS — W1839XA Other fall on same level, initial encounter: Secondary | ICD-10-CM | POA: Diagnosis not present

## 2015-08-25 DIAGNOSIS — Z7982 Long term (current) use of aspirin: Secondary | ICD-10-CM | POA: Diagnosis not present

## 2015-08-25 DIAGNOSIS — J449 Chronic obstructive pulmonary disease, unspecified: Secondary | ICD-10-CM | POA: Insufficient documentation

## 2015-08-25 DIAGNOSIS — Y9289 Other specified places as the place of occurrence of the external cause: Secondary | ICD-10-CM | POA: Diagnosis not present

## 2015-08-25 DIAGNOSIS — I10 Essential (primary) hypertension: Secondary | ICD-10-CM | POA: Diagnosis not present

## 2015-08-25 DIAGNOSIS — Z853 Personal history of malignant neoplasm of breast: Secondary | ICD-10-CM | POA: Insufficient documentation

## 2015-08-25 DIAGNOSIS — S7002XA Contusion of left hip, initial encounter: Secondary | ICD-10-CM | POA: Insufficient documentation

## 2015-08-25 DIAGNOSIS — Z79899 Other long term (current) drug therapy: Secondary | ICD-10-CM | POA: Insufficient documentation

## 2015-08-25 DIAGNOSIS — E119 Type 2 diabetes mellitus without complications: Secondary | ICD-10-CM | POA: Diagnosis not present

## 2015-08-25 DIAGNOSIS — Z87891 Personal history of nicotine dependence: Secondary | ICD-10-CM | POA: Diagnosis not present

## 2015-08-25 DIAGNOSIS — Y9389 Activity, other specified: Secondary | ICD-10-CM | POA: Diagnosis not present

## 2015-08-25 DIAGNOSIS — Y998 Other external cause status: Secondary | ICD-10-CM | POA: Insufficient documentation

## 2015-08-25 DIAGNOSIS — S0990XA Unspecified injury of head, initial encounter: Secondary | ICD-10-CM | POA: Diagnosis not present

## 2015-08-25 DIAGNOSIS — W19XXXA Unspecified fall, initial encounter: Secondary | ICD-10-CM

## 2015-08-25 DIAGNOSIS — S79912A Unspecified injury of left hip, initial encounter: Secondary | ICD-10-CM | POA: Diagnosis present

## 2015-08-25 NOTE — ED Provider Notes (Signed)
By signing my name below, I, Evelene Croon, attest that this documentation has been prepared under the direction and in the presence of [place], DO . Electronically Signed: Evelene Croon, Scribe. 08/26/2015. 12:24 AM.   TIME SEEN: 11:54 PM   CHIEF COMPLAINT: Fall  LEVEL 5 CAVEAT DUE TO Dementia   HPI:   Adrienne [name] is a 81 y.o. Female brought in by ambulance, who presents to the Emergency Department s/p fall this evening complaining of left hip pain following the incident.  Pt is unsure why she fell. She is currently in rehab secondary to recent PNA, she normally lives at home with family.  Family states pt normally uses a walker to ambulate but they think that she probably got out of bed without assistance causing her to fall. Fall was unwitnessed. She does have a hematoma to the left parietal area. Family denies any recent fever, cough, vomiting or diarrhea. She was given 1 mg morphine and nitro PTA; staff stated patient was found clutching her chest but she denies current chest pain or shortness of breath. Pt is on no other blood thinner other than ASA. Pt is a poor historian secondary to h/o dementia.   She is allergic to Tdap.   ROS: LEVEL 5 CAVEAT DUE TO Dementia    PAST MEDICAL HISTORY/PAST SURGICAL HISTORY:  Past Medical History  Diagnosis Date  . COPD (chronic obstructive pulmonary disease) ([place])   . Arthritis   . Cancer ([place])   . Breast cancer ([place])     right  . Hypertension   . AKI (acute kidney injury) ([place]) 10/01/2013  . Shortness of breath   . Diabetes mellitus without complication ([place])     TYPE 2    MEDICATIONS:  Prior to Admission medications   Medication Sig Start Date End Date Taking? Authorizing Provider  acetaminophen (TYLENOL) 500 MG tablet Take 1,000 mg by mouth every 6 (six) hours as needed for mild pain (back pain).     Historical Provider, MD  albuterol (PROVENTIL HFA;VENTOLIN HFA) 108 (90 BASE) MCG/ACT inhaler Inhale 2 puffs into the  lungs every 6 (six) hours as needed for wheezing or shortness of breath. 10/04/13   Modena Jansky, MD  albuterol (PROVENTIL) (2.5 MG/3ML) 0.083% nebulizer solution Take 2.5 mg by nebulization 2 (two) times daily.     Historical Provider, MD  aspirin EC 81 MG tablet Take 81 mg by mouth every evening.    Historical Provider, MD  benazepril (LOTENSIN) 5 MG tablet Take 1 tablet (5 mg total) by mouth daily. 08/18/15   Theodis Blaze, MD  diphenhydramine-acetaminophen (TYLENOL PM) 25-500 MG TABS Take 1.5 tablets by mouth at bedtime as needed (pain).     Historical Provider, MD  Docusate Calcium (STOOL SOFTENER PO) Take 1 tablet by mouth at bedtime as needed (constipation).    Historical Provider, MD  isosorbide mononitrate (IMDUR) 30 MG 24 hr tablet Take 1 tablet (30 mg total) by mouth daily. 08/16/15   Mechele Claude, DO  Ketotifen Fumarate (ALAWAY OP) Apply 1 drop to eye 2 (two) times daily as needed (itchy eyes). Both eyes For itchy eyes    Historical Provider, MD  levofloxacin (LEVAQUIN) 500 MG tablet Take 1 tablet (500 mg total) by mouth daily. 08/18/15   Theodis Blaze, MD  LORazepam (ATIVAN) 2 MG tablet Take 0.5 tablets (1 mg total) by mouth every 6 (six) hours as needed for anxiety. 08/18/15   Theodis Blaze, MD  metoprolol succinate (  TOPROL-XL) 25 MG 24 hr tablet Take 0.5 tablets (12.5 mg total) by mouth daily. 08/18/15   Theodis Blaze, MD  promethazine (PHENERGAN) 25 MG tablet Take 12.5 mg by mouth every 6 (six) hours as needed for nausea or vomiting (nausea).    Historical Provider, MD  QUEtiapine (SEROQUEL) 100 MG tablet Take 50-100 mg by mouth 2 (two) times daily. Depending on level of agitation.    Historical Provider, MD  sertraline (ZOLOFT) 50 MG tablet Take 50 mg by mouth daily.  10/07/13   Historical Provider, MD  tiotropium (SPIRIVA HANDIHALER) 18 MCG inhalation capsule Place 1 capsule (18 mcg total) into inhaler and inhale daily. 10/04/13   Modena Jansky, MD  verapamil (CALAN) 40 MG  tablet Take 1 tablet (40 mg total) by mouth 2 (two) times daily. 08/18/15   Theodis Blaze, MD    ALLERGIES:  Allergies  Allergen Reactions  . Tetanus Toxoids Swelling  . Latex Rash  . Penicillins Rash    SOCIAL HISTORY:  Social History  Substance Use Topics  . Smoking status: Former Smoker -- 2.00 packs/day for 30 years    Types: Cigarettes    Quit date: 02/20/1980  . Smokeless tobacco: Never Used  . Alcohol Use: No    FAMILY HISTORY: Family History  Problem Relation Age of Onset  . Lung cancer Father   . Heart disease Brother     EXAM: BP 209/81 mmHg  Pulse 76  Temp(Src) 97.8 F (36.6 C) (Oral)  Resp 16  SpO2 92% CONSTITUTIONAL: Alert, drowsy but arousable to voice and will respond appropriately to some question but question answered by family HEAD: Normocephalic;  There is small hematoma to left parietal region  EYES: Conjunctivae clear, EOMI. Pupils pinpoint bilaterally ENT: normal nose; no rhinorrhea; moist mucous membranes; pharynx without lesions noted; no dental injury; no septal hematoma NECK: Supple, no meningismus, no LAD; no nuchal rigidity, Intermittently has midline c-spine tend without stepoffs or deformity  CARD: RRR; S1 and S2 appreciated; no murmurs, no clicks, no rubs, no gallops RESP: Normal chest excursion without splinting or tachypnea; breath sounds clear and equal bilaterally; no wheezes, no rhonchi, no rales; no hypoxia or respiratory distress CHEST:  chest wall stable, no crepitus or ecchymosis or deformity; diffusley tender to anterior chest. ABD/GI: Normal bowel sounds; non-distended; soft, diffusely and minimally tender throughout abdomen. Small area of ecchymosis to RLQ, no rebound, no guarding PELVIS:  stable, TTP over left hip with associated large posterior hematoma. No leg length discrepancy or  rotation  BACK:  The back appears normal and is intermittently tender to thoracic and lumbar spine;  there is no CVA tenderness; no midline spinal  tenderness, step-off or deformity EXT: Tenderness over the left elbow with associated small skin tear to lateral left elbow with no obvious bony deformity; FROM in joint; small amount of swelling and ecchymosis over lateral elbow. Otherwise extremities non-tender to palpation; no edema; normal capillary refill; no cyanosis, no bony tenderness or bony deformity of patient's extremities, no joint effusion, no ecchymosis or lacerations   SKIN: Normal color for age and race; warm NEURO: Moves all extremities equally, sensation to light touch intact diffusely, cranial nerves II through XII intact PSYCH: The patient's mood and manner are appropriate. Grooming and personal hygiene are appropriate.   EKG Interpretation  Date/Time:  Friday August 25 2015 23:39:42 EST Ventricular Rate:  82 PR Interval:  193 QRS Duration: 87 QT Interval:  382 QTC Calculation: 446 R Axis:  34 Text Interpretation:  Age not entered, assumed to be  81 years old for purpose of ECG interpretation Sinus rhythm Abnormal R-wave progression, early transition No significant change since last tracing Confirmed by Alanzo Lamb,  DO, Nicolai Labonte [phone]) on 08/25/2015 11:44:11 PM       MEDICAL DECISION MAKING: Patient here with possible mechanical fall. She does have multiple areas of bruising and tenderness when you palpate but her exam changes frequently. She denies any pain currently but did receive morphine prior to arrival. Patient's family reports she is at her neurologic baseline. She is hemodynamically stable. Have offered full workup including labs, urine, imaging but family states at this time they would only want imaging of her hip. They understand that they could be missing life-threatening illness such as infection, traumatic injury such as intracranial hemorrhage, spinal fractures, splenic or liver lacerations, pneumothorax. They state that she is a DO NOT RESUSCITATE and they would not want further workup. They would be interested in  surgery of the hip was fractured she still ambulates. Will obtain x-ray of the hip. Patient denies wanting pain medication. She does have an abrasion to the left arm but family reports that she is allergic to tetanus vaccination.  ED PROGRESS: Patient's x-ray shows no fracture dislocation. She is able to ambulate with assistance. Family again state that they do not want further workup. There aware that there may be risks of injury that we cannot assess that could be life-threatening. Patient was initially hypertensive but this has improved without intervention. Discussed with family that if there is any symptom that they are concerned about a change her mind and want further workup there will come to bring the patient back to the emergency department. Discussed return precautions. They verbalize understanding and are comfortable with this plan.   I personally performed the services described in this documentation, which was scribed in my presence. The recorded information has been reviewed and is accurate.    [place], DO 08/26/15 [phone]

## 2015-08-25 NOTE — ED Notes (Signed)
Per EMS, pt suffered a fall at her SNF with an unknown downtime. Reported injury to L hip, bound by facility with a sheet. Also reports having struck her head, unknown LOC. Also c/o RUQ abdominal pain.

## 2015-08-26 ENCOUNTER — Emergency Department (HOSPITAL_COMMUNITY): Payer: Medicare Other

## 2015-08-26 DIAGNOSIS — W1839XA Other fall on same level, initial encounter: Secondary | ICD-10-CM | POA: Diagnosis not present

## 2015-08-26 DIAGNOSIS — Z7982 Long term (current) use of aspirin: Secondary | ICD-10-CM | POA: Diagnosis not present

## 2015-08-26 DIAGNOSIS — Y998 Other external cause status: Secondary | ICD-10-CM | POA: Diagnosis not present

## 2015-08-26 DIAGNOSIS — Y9289 Other specified places as the place of occurrence of the external cause: Secondary | ICD-10-CM | POA: Diagnosis not present

## 2015-08-26 DIAGNOSIS — S0990XA Unspecified injury of head, initial encounter: Secondary | ICD-10-CM | POA: Diagnosis not present

## 2015-08-26 DIAGNOSIS — Z853 Personal history of malignant neoplasm of breast: Secondary | ICD-10-CM | POA: Diagnosis not present

## 2015-08-26 DIAGNOSIS — M1990 Unspecified osteoarthritis, unspecified site: Secondary | ICD-10-CM | POA: Diagnosis not present

## 2015-08-26 DIAGNOSIS — S7002XA Contusion of left hip, initial encounter: Secondary | ICD-10-CM | POA: Diagnosis not present

## 2015-08-26 DIAGNOSIS — J449 Chronic obstructive pulmonary disease, unspecified: Secondary | ICD-10-CM | POA: Diagnosis not present

## 2015-08-26 DIAGNOSIS — E119 Type 2 diabetes mellitus without complications: Secondary | ICD-10-CM | POA: Diagnosis not present

## 2015-08-26 DIAGNOSIS — Y9389 Activity, other specified: Secondary | ICD-10-CM | POA: Diagnosis not present

## 2015-08-26 DIAGNOSIS — Z87891 Personal history of nicotine dependence: Secondary | ICD-10-CM | POA: Diagnosis not present

## 2015-08-26 DIAGNOSIS — S79912A Unspecified injury of left hip, initial encounter: Secondary | ICD-10-CM | POA: Diagnosis present

## 2015-08-26 DIAGNOSIS — I10 Essential (primary) hypertension: Secondary | ICD-10-CM | POA: Diagnosis not present

## 2015-08-26 DIAGNOSIS — Z79899 Other long term (current) drug therapy: Secondary | ICD-10-CM | POA: Diagnosis not present

## 2015-08-26 NOTE — ED Notes (Signed)
Pt to be transported back to [facility] for transport.

## 2015-08-26 NOTE — ED Notes (Signed)
Reviewed d/c instructions with pt and gave paperwork to [facility] crew. Pt departed with [facility]AR and in NAD.

## 2015-08-26 NOTE — ED Notes (Signed)
Patient transported to X-ray 

## 2015-08-26 NOTE — ED Notes (Signed)
Pt received 1mg  Morphine & 2 sprays of Nitro at the facility, per EMS.

## 2015-08-26 NOTE — Discharge Instructions (Signed)
Head Injury, Adult You have a head injury. Headaches and throwing up (vomiting) are common after a head injury. It should be easy to wake up from sleeping. Sometimes you must stay in the hospital. Most problems happen within the first 24 hours. Side effects may occur up to 7-10 days after the injury.  WHAT ARE THE TYPES OF HEAD INJURIES? Head injuries can be as minor as a bump. Some head injuries can be more severe. More severe head injuries include:  A jarring injury to the brain (concussion).  A bruise of the brain (contusion). This mean there is bleeding in the brain that can cause swelling.  A cracked skull (skull fracture).  Bleeding in the brain that collects, clots, and forms a bump (hematoma). WHEN SHOULD I GET HELP RIGHT AWAY?   You are confused or sleepy.  You cannot be woken up.  You feel sick to your stomach (nauseous) or keep throwing up (vomiting).  Your dizziness or unsteadiness is getting worse.  You have very bad, lasting headaches that are not helped by medicine. Take medicines only as told by your doctor.  You cannot use your arms or legs like normal.  You cannot walk.  You notice changes in the black spots in the center of the colored part of your eye (pupil).  You have clear or bloody fluid coming from your nose or ears.  You have trouble seeing. During the next 24 hours after the injury, you must stay with someone who can watch you. This person should get help right away (call 911 in the [place] if you start to shake and are not able to control it (have seizures), you pass out, or you are unable to wake up. HOW CAN I PREVENT A HEAD INJURY IN THE FUTURE?  Wear seat belts.  Wear a helmet while bike riding and playing sports like football.  Stay away from dangerous activities around the house. WHEN CAN I RETURN TO NORMAL ACTIVITIES AND ATHLETICS? See your doctor before doing these activities. You should not do normal activities or play contact sports until 1  week after the following symptoms have stopped:  Headache that does not go away.  Dizziness.  Poor attention.  Confusion.  Memory problems.  Sickness to your stomach or throwing up.  Tiredness.  Fussiness.  Bothered by bright lights or loud noises.  Anxiousness or depression.  Restless sleep. MAKE SURE YOU:   Understand these instructions.  Will watch your condition.  Will get help right away if you are not doing well or get worse.   This information is not intended to replace advice given to you by your health care provider. Make sure you discuss any questions you have with your health care provider.   Document Released: 08/15/2008 Document Revised: 09/23/2014 Document Reviewed: 05/10/2013 Elsevier Interactive Patient Education 2016 [employer] Hematoma A hematoma is a collection of blood under the skin, in an organ, in a body space, in a joint space, or in other tissue. The blood can clot to form a lump that you can see and feel. The lump is often firm and may sometimes become sore and tender. Most hematomas get better in a few days to weeks. However, some hematomas may be serious and require medical care. Hematomas can range in size from very small to very large. CAUSES  A hematoma can be caused by a blunt or penetrating injury. It can also be caused by spontaneous leakage from a blood vessel under the skin. Spontaneous leakage  from a blood vessel is more likely to occur in older people, especially those taking blood thinners. Sometimes, a hematoma can develop after certain medical procedures. SIGNS AND SYMPTOMS   A firm lump on the body.  Possible pain and tenderness in the area.  Bruising.Blue, dark blue, purple-red, or yellowish skin may appear at the site of the hematoma if the hematoma is close to the surface of the skin. For hematomas in deeper tissues or body spaces, the signs and symptoms may be subtle. For example, an intra-abdominal hematoma may cause  abdominal pain, weakness, fainting, and shortness of breath. An intracranial hematoma may cause a headache or symptoms such as weakness, trouble speaking, or a change in consciousness. DIAGNOSIS  A hematoma can usually be diagnosed based on your medical history and a physical exam. Imaging tests may be needed if your health care provider suspects a hematoma in deeper tissues or body spaces, such as the abdomen, head, or chest. These tests may include ultrasonography or a CT scan.  TREATMENT  Hematomas usually go away on their own over time. Rarely does the blood need to be drained out of the body. Large hematomas or those that may affect vital organs will sometimes need surgical drainage or monitoring. HOME CARE INSTRUCTIONS   Apply ice to the injured area:   Put ice in a plastic bag.   Place a towel between your skin and the bag.   Leave the ice on for 20 minutes, 2-3 times a day for the first 1 to 2 days.   After the first 2 days, switch to using warm compresses on the hematoma.   Elevate the injured area to help decrease pain and swelling. Wrapping the area with an elastic bandage may also be helpful. Compression helps to reduce swelling and promotes shrinking of the hematoma. Make sure the bandage is not wrapped too tight.   If your hematoma is on a lower extremity and is painful, crutches may be helpful for a couple days.   Only take over-the-counter or prescription medicines as directed by your health care provider. SEEK IMMEDIATE MEDICAL CARE IF:   You have increasing pain, or your pain is not controlled with medicine.   You have a fever.   You have worsening swelling or discoloration.   Your skin over the hematoma breaks or starts bleeding.   Your hematoma is in your chest or abdomen and you have weakness, shortness of breath, or a change in consciousness.  Your hematoma is on your scalp (caused by a fall or injury) and you have a worsening headache or a change in  alertness or consciousness. MAKE SURE YOU:   Understand these instructions.  Will watch your condition.  Will get help right away if you are not doing well or get worse.   This information is not intended to replace advice given to you by your health care provider. Make sure you discuss any questions you have with your health care provider.   Document Released: 04/16/2004 Document Revised: 05/05/2013 Document Reviewed: 02/10/2013 Elsevier Interactive Patient Education 2016 [place] for Routine Care of Injuries Theroutine careofmanyinjuriesincludes rest, ice, compression, and elevation (RICE therapy). RICE therapy is often recommended for injuries to soft tissues, such as a muscle strain, ligament injuries, bruises, and overuse injuries. It can also be used for some bony injuries. Using RICE therapy can help to relieve pain, lessen swelling, and enable your body to heal. Rest Rest is required to allow your body to heal.  This usually involves reducing your normal activities and avoiding use of the injured part of your body. Generally, you can return to your normal activities when you are comfortable and have been given permission by your health care provider. Ice Icing your injury helps to keep the swelling down, and it lessens pain. Do not apply ice directly to your skin.  Put ice in a plastic bag.  Place a towel between your skin and the bag.  Leave the ice on for 20 minutes, 2-3 times a day. Do this for as long as you are directed by your health care provider. Compression Compression means putting pressure on the injured area. Compression helps to keep swelling down, gives support, and helps with discomfort. Compression may be done with an elastic bandage. If an elastic bandage has been applied, follow these general tips:  Remove and reapply the bandage every 3-4 hours or as directed by your health care provider.  Make sure the bandage is not wrapped too tightly,  because this can cut off circulation. If part of your body beyond the bandage becomes blue, numb, cold, swollen, or more painful, your bandage is most likely too tight. If this occurs, remove your bandage and reapply it more loosely.  See your health care provider if the bandage seems to be making your problems worse rather than better. Elevation Elevation means keeping the injured area raised. This helps to lessen swelling and decrease pain. If possible, your injured area should be elevated at or above the level of your heart or the center of your chest. Baring? You should seek medical care if:  Your pain and swelling continue.  Your symptoms are getting worse rather than improving. These symptoms may indicate that further evaluation or further X-rays are needed. Sometimes, X-rays may not show a small broken bone (fracture) until a number of days later. Make a follow-up appointment with your health care provider. WHEN SHOULD I SEEK IMMEDIATE MEDICAL CARE? You should seek immediate medical care if:  You have sudden severe pain at or below the area of your injury.  You have redness or increased swelling around your injury.  You have tingling or numbness at or below the area of your injury that does not improve after you remove the elastic bandage.   This information is not intended to replace advice given to you by your health care provider. Make sure you discuss any questions you have with your health care provider.   Document Released: 12/15/2000 Document Revised: 05/24/2015 Document Reviewed: 08/10/2014 Elsevier Interactive Patient Education [employer].

## 2015-08-26 NOTE — ED Notes (Signed)
Pt assisted to her feet and ambulated from bed to door. Pt able to bear weight equally on LEs with help of walker and standby assistance of RN & NT. Pt reported weakness near door and was then assisted back to bed. Family reports difficulty with ambulation as baseline at home, and that she hasn't been ambulated "much at all" while at rehab.

## 2016-07-15 IMAGING — DX DG HIP (WITH OR WITHOUT PELVIS) 2-3V*L*
3 series · 3 of 3 positions shown · non-contrast
Comparison: Sacrum and coccyx 12/20/2014

CLINICAL DATA: Post fall with left hip pain.

EXAM:
DG HIP (WITH OR WITHOUT PELVIS) 2-3V LEFT

[pelvis ap]
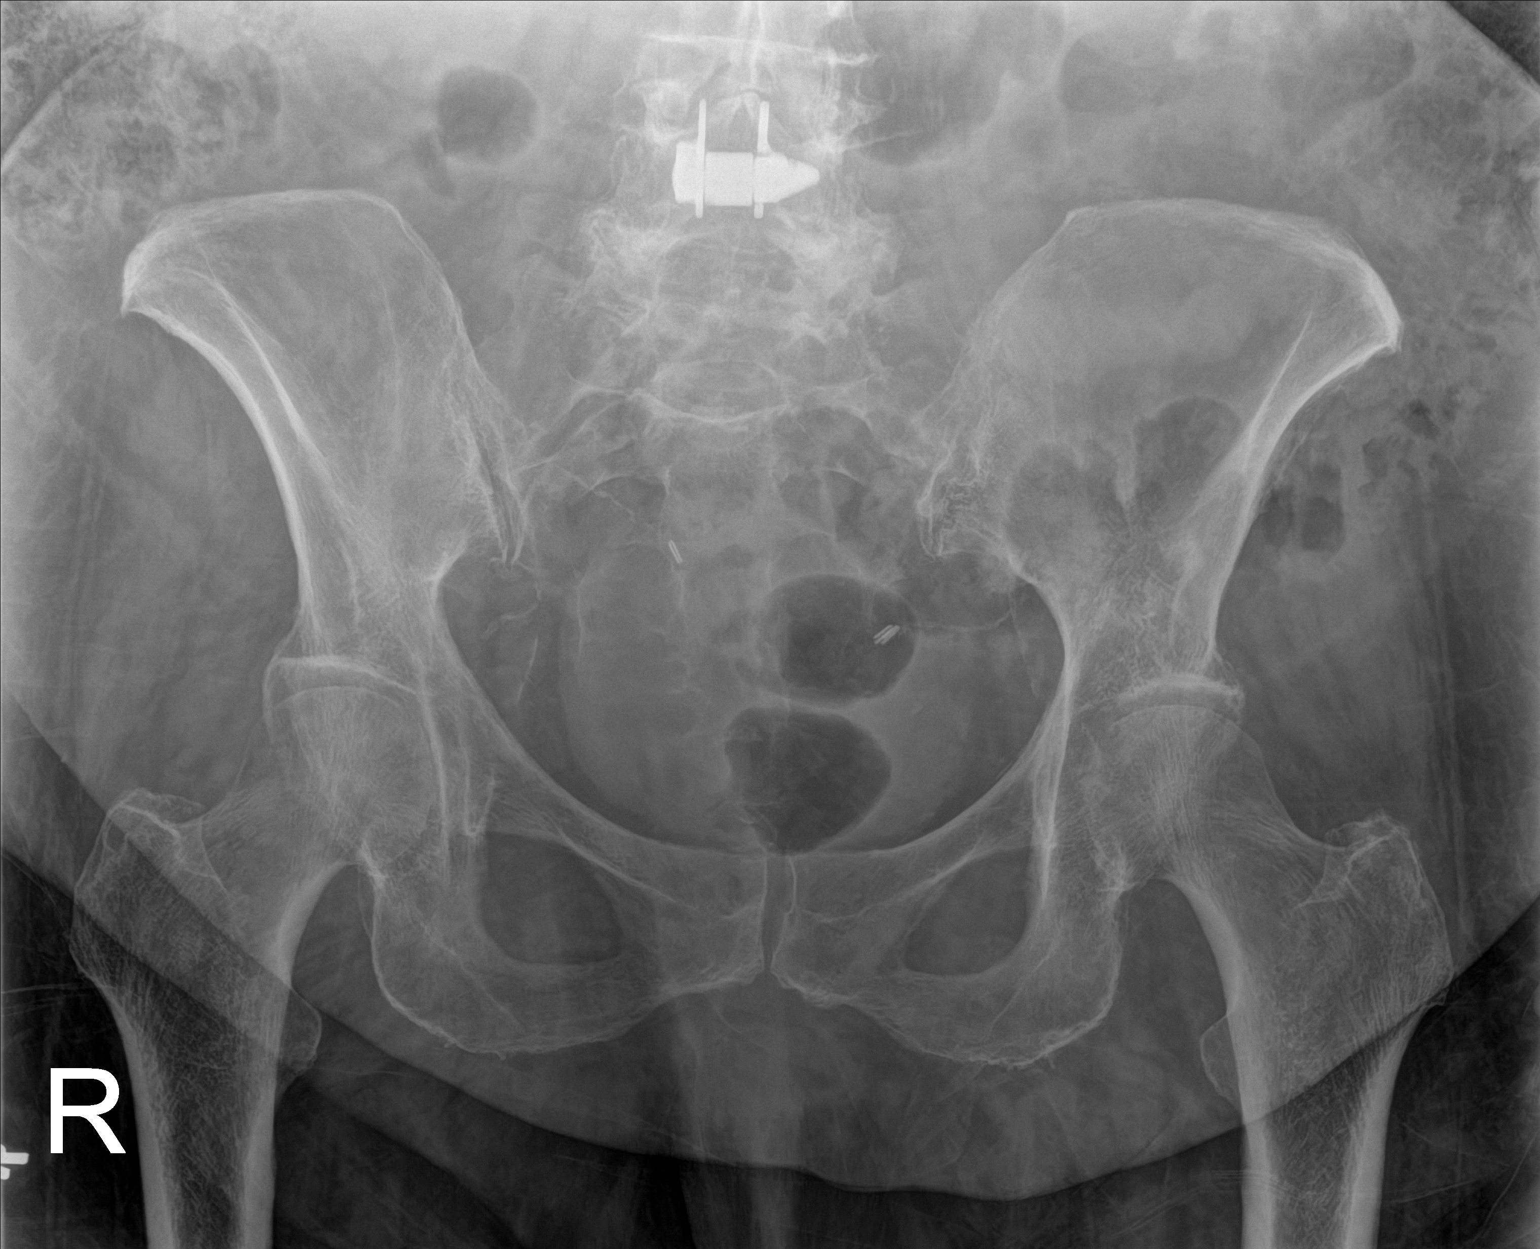

[hip ap]
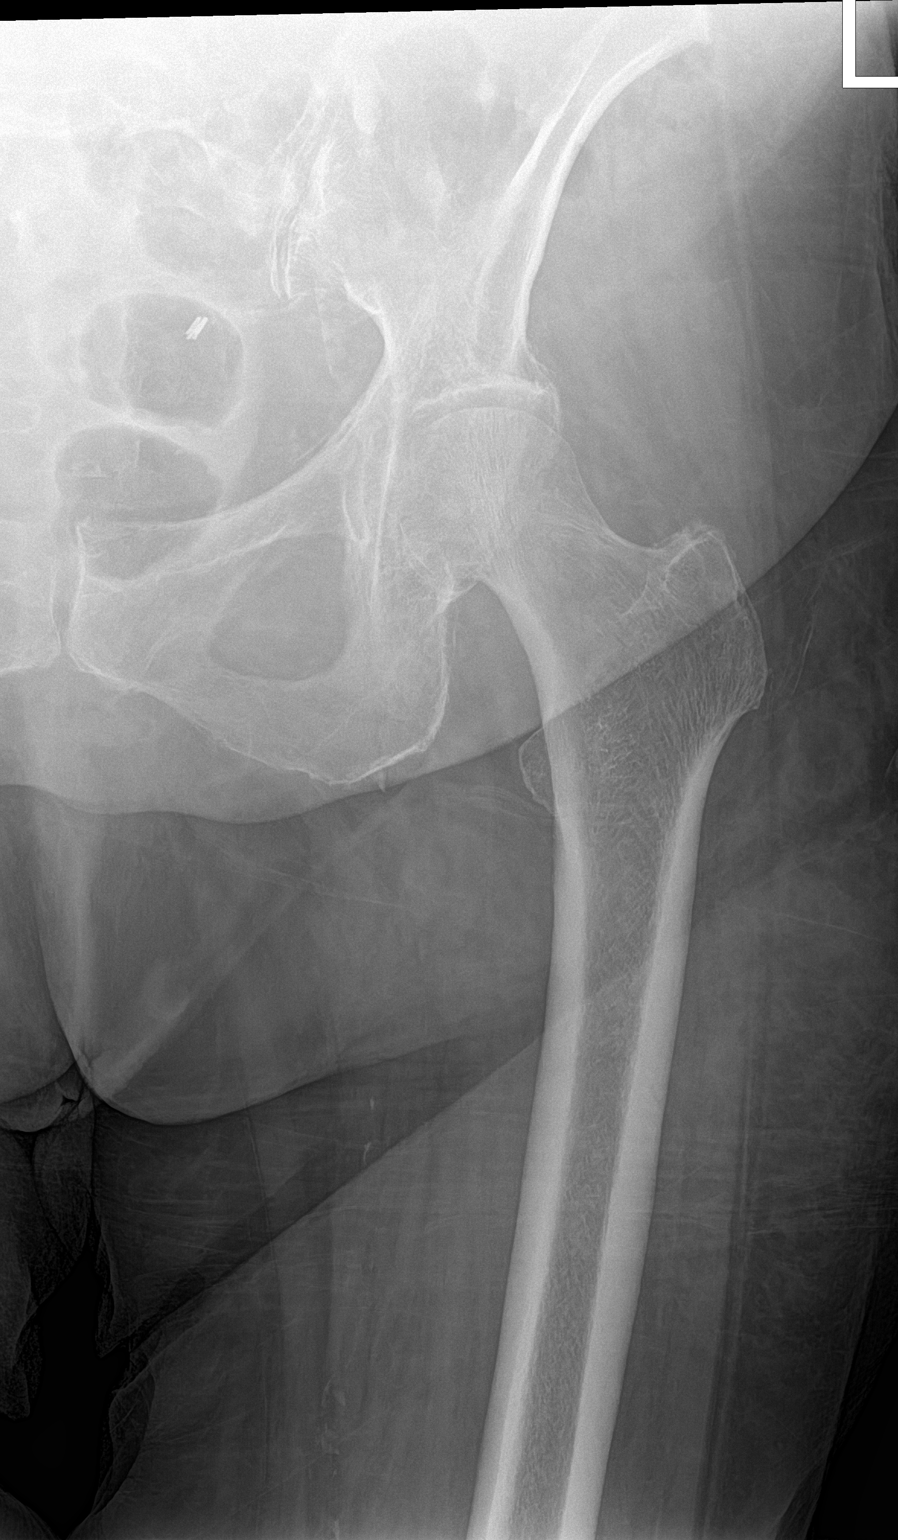

[hip lat]
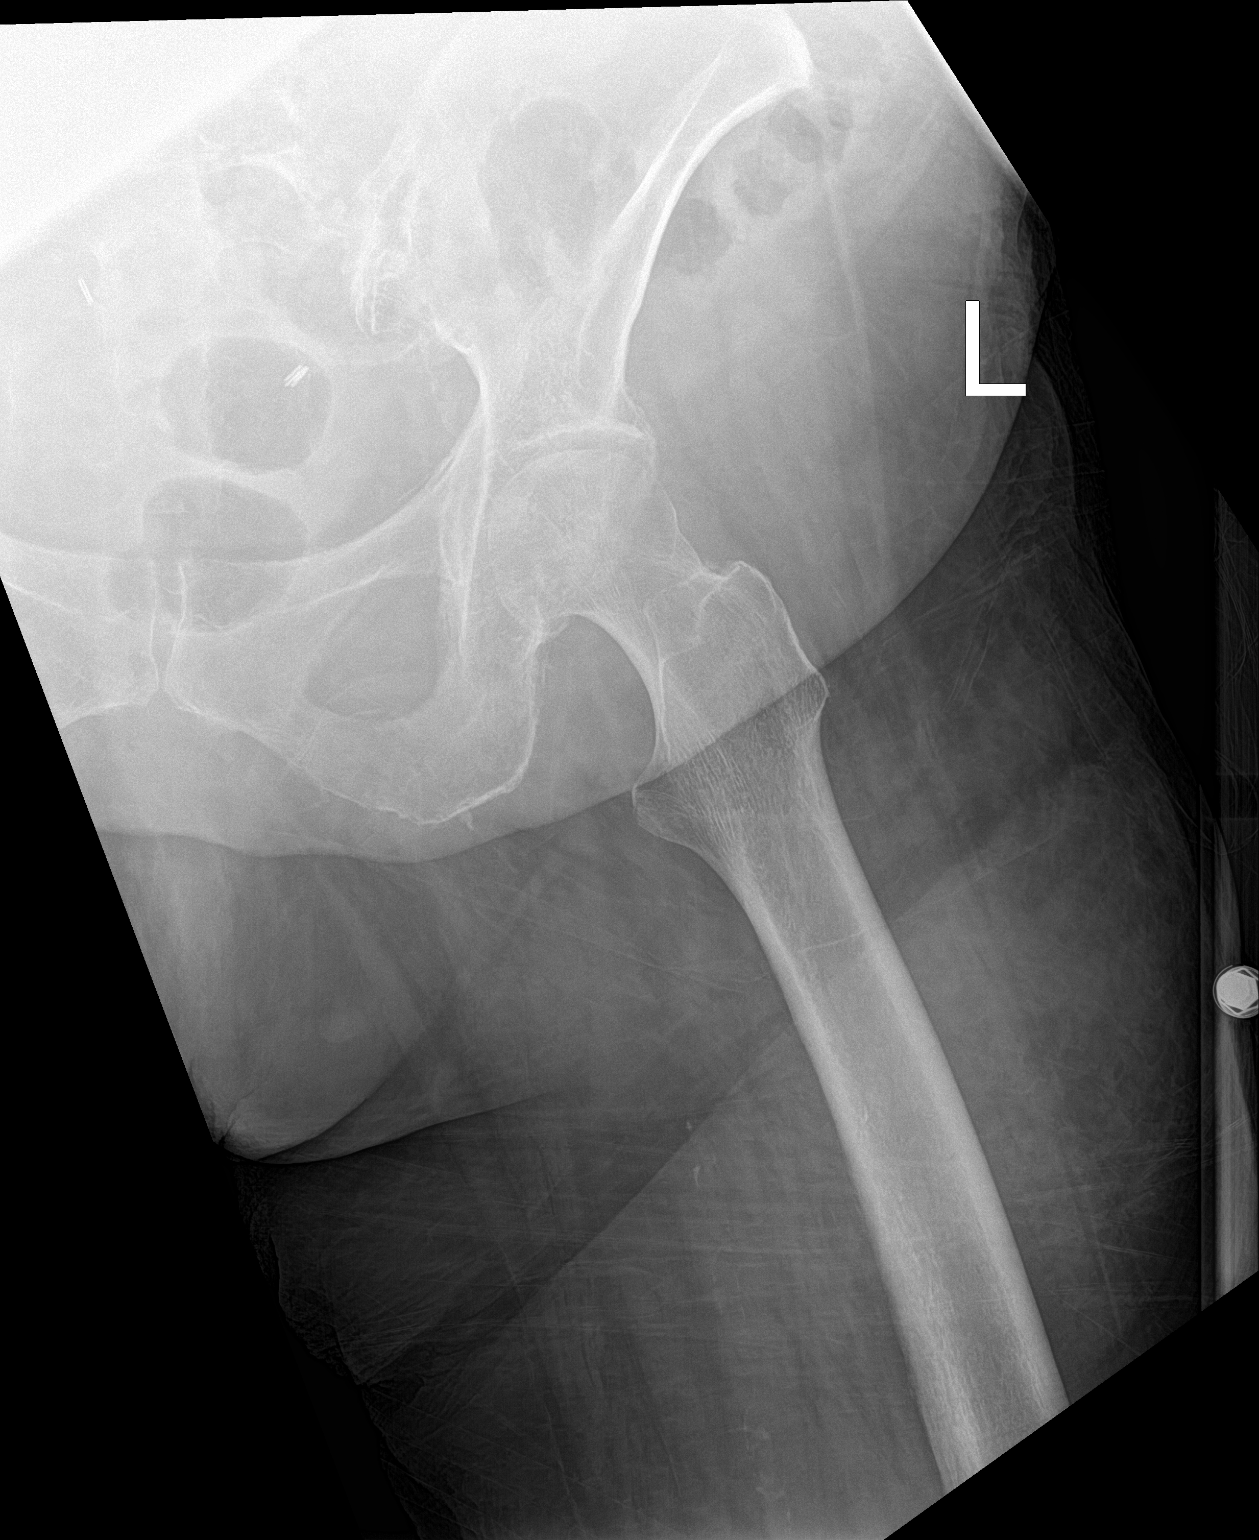

[3 of 3 positions shown; findings below may reference images not displayed]

FINDINGS: The cortical margins of the bony pelvis are intact. No fracture.
Pubic symphysis and sacroiliac joints are congruent. Both femoral
heads are well-seated in the respective acetabula. Mild degenerative
change of the left hip. Bones are under mineralized. Postsurgical
change in the lower lumbar spine, partially included.
IMPRESSION: No evidence of pelvis or left hip fracture.

## 2017-02-14 DEATH — deceased
